# Patient Record
Sex: Female | Born: 1980
Health system: Southern US, Community
[De-identification: ages and names within clinical notes are randomized; demographics above are authoritative.]

## PROBLEM LIST (undated history)

## (undated) DIAGNOSIS — D649 Anemia, unspecified: Secondary | ICD-10-CM

## (undated) DIAGNOSIS — F32A Depression, unspecified: Secondary | ICD-10-CM

## (undated) DIAGNOSIS — I7774 Dissection of vertebral artery: Secondary | ICD-10-CM

## (undated) DIAGNOSIS — R51 Headache: Secondary | ICD-10-CM

## (undated) DIAGNOSIS — N979 Female infertility, unspecified: Secondary | ICD-10-CM

## (undated) DIAGNOSIS — R519 Headache, unspecified: Secondary | ICD-10-CM

## (undated) DIAGNOSIS — F319 Bipolar disorder, unspecified: Secondary | ICD-10-CM

## (undated) DIAGNOSIS — J45909 Unspecified asthma, uncomplicated: Secondary | ICD-10-CM

## (undated) DIAGNOSIS — J302 Other seasonal allergic rhinitis: Secondary | ICD-10-CM

## (undated) DIAGNOSIS — E039 Hypothyroidism, unspecified: Secondary | ICD-10-CM

## (undated) DIAGNOSIS — I639 Cerebral infarction, unspecified: Secondary | ICD-10-CM

## (undated) DIAGNOSIS — F329 Major depressive disorder, single episode, unspecified: Secondary | ICD-10-CM

## (undated) HISTORY — DX: Unspecified asthma, uncomplicated: J45.909

## (undated) HISTORY — DX: Dissection of vertebral artery: I77.74

## (undated) HISTORY — DX: Hypothyroidism, unspecified: E03.9

## (undated) HISTORY — PX: CYST REMOVAL HAND: SHX6279

## (undated) HISTORY — PX: WISDOM TOOTH EXTRACTION: SHX21

## (undated) HISTORY — DX: Cerebral infarction, unspecified: I63.9

---
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
## 2008-09-02 ENCOUNTER — Encounter (INDEPENDENT_AMBULATORY_CARE_PROVIDER_SITE_OTHER): Payer: Self-pay | Admitting: *Deleted

## 2008-09-16 ENCOUNTER — Telehealth (INDEPENDENT_AMBULATORY_CARE_PROVIDER_SITE_OTHER): Payer: Self-pay | Admitting: *Deleted

## 2008-09-22 ENCOUNTER — Telehealth (INDEPENDENT_AMBULATORY_CARE_PROVIDER_SITE_OTHER): Payer: Self-pay | Admitting: *Deleted

## 2008-10-02 ENCOUNTER — Ambulatory Visit: Payer: Self-pay | Admitting: Family Medicine

## 2008-10-02 DIAGNOSIS — E039 Hypothyroidism, unspecified: Secondary | ICD-10-CM | POA: Insufficient documentation

## 2008-10-02 DIAGNOSIS — Z8659 Personal history of other mental and behavioral disorders: Secondary | ICD-10-CM | POA: Insufficient documentation

## 2008-10-02 DIAGNOSIS — G43829 Menstrual migraine, not intractable, without status migrainosus: Secondary | ICD-10-CM | POA: Insufficient documentation

## 2008-10-02 DIAGNOSIS — D649 Anemia, unspecified: Secondary | ICD-10-CM | POA: Insufficient documentation

## 2008-10-02 DIAGNOSIS — R109 Unspecified abdominal pain: Secondary | ICD-10-CM | POA: Insufficient documentation

## 2008-10-02 DIAGNOSIS — J309 Allergic rhinitis, unspecified: Secondary | ICD-10-CM | POA: Insufficient documentation

## 2008-10-02 DIAGNOSIS — F988 Other specified behavioral and emotional disorders with onset usually occurring in childhood and adolescence: Secondary | ICD-10-CM | POA: Insufficient documentation

## 2008-10-08 LAB — CONVERTED CEMR LAB
ALT: 13 units/L (ref 0–35)
AST: 24 units/L (ref 0–37)
Albumin: 3.8 g/dL (ref 3.5–5.2)
Alkaline Phosphatase: 45 units/L (ref 39–117)
BUN: 12 mg/dL (ref 6–23)
Basophils Absolute: 0 10*3/uL (ref 0.0–0.1)
Basophils Relative: 0.2 % (ref 0.0–3.0)
Bilirubin, Direct: 0.1 mg/dL (ref 0.0–0.3)
CO2: 29 meq/L (ref 19–32)
Calcium: 9.5 mg/dL (ref 8.4–10.5)
Chloride: 105 meq/L (ref 96–112)
Cholesterol: 172 mg/dL (ref 0–200)
Creatinine, Ser: 0.7 mg/dL (ref 0.4–1.2)
Eosinophils Absolute: 0.1 10*3/uL (ref 0.0–0.7)
Eosinophils Relative: 1.9 % (ref 0.0–5.0)
GFR calc Af Amer: 127 mL/min
GFR calc non Af Amer: 105 mL/min
Glucose, Bld: 85 mg/dL (ref 70–99)
HCT: 37.6 % (ref 36.0–46.0)
HDL: 53.4 mg/dL (ref >39.0)
Hemoglobin: 12.6 g/dL (ref 12.0–15.0)
LDL Cholesterol: 104 mg/dL — ABNORMAL HIGH (ref 0–99)
Lymphocytes Relative: 33.7 % (ref 12.0–46.0)
MCHC: 33.5 g/dL (ref 30.0–36.0)
MCV: 93.4 fL (ref 78.0–100.0)
Monocytes Absolute: 0.5 10*3/uL (ref 0.1–1.0)
Monocytes Relative: 8.4 % (ref 3.0–12.0)
Neutro Abs: 3 10*3/uL (ref 1.4–7.7)
Neutrophils Relative %: 55.8 % (ref 43.0–77.0)
Platelets: 276 10*3/uL (ref 150–400)
Potassium: 4.7 meq/L (ref 3.5–5.1)
RBC: 4.03 M/uL (ref 3.87–5.11)
RDW: 12.4 % (ref 11.5–14.6)
Sodium: 141 meq/L (ref 135–145)
TSH: 3.83 microintl units/mL (ref 0.35–5.50)
Total Bilirubin: 0.9 mg/dL (ref 0.3–1.2)
Total CHOL/HDL Ratio: 3.2
Total Protein: 6.9 g/dL (ref 6.0–8.3)
Triglycerides: 71 mg/dL (ref 0–149)
VLDL: 14 mg/dL (ref 0–40)
WBC: 5.5 10*3/uL (ref 4.5–10.5)

## 2008-10-09 ENCOUNTER — Telehealth (INDEPENDENT_AMBULATORY_CARE_PROVIDER_SITE_OTHER): Payer: Self-pay | Admitting: *Deleted

## 2008-10-13 ENCOUNTER — Encounter: Payer: Self-pay | Admitting: Family Medicine

## 2008-10-14 ENCOUNTER — Telehealth (INDEPENDENT_AMBULATORY_CARE_PROVIDER_SITE_OTHER): Payer: Self-pay | Admitting: *Deleted

## 2008-10-16 ENCOUNTER — Telehealth (INDEPENDENT_AMBULATORY_CARE_PROVIDER_SITE_OTHER): Payer: Self-pay | Admitting: *Deleted

## 2008-10-29 ENCOUNTER — Ambulatory Visit: Payer: Self-pay | Admitting: Family Medicine

## 2008-10-29 ENCOUNTER — Encounter: Payer: Self-pay | Admitting: Family Medicine

## 2008-10-29 ENCOUNTER — Other Ambulatory Visit: Admission: RE | Admit: 2008-10-29 | Discharge: 2008-10-29 | Payer: Self-pay | Admitting: Family Medicine

## 2008-10-29 DIAGNOSIS — M25579 Pain in unspecified ankle and joints of unspecified foot: Secondary | ICD-10-CM | POA: Insufficient documentation

## 2008-11-04 ENCOUNTER — Encounter (INDEPENDENT_AMBULATORY_CARE_PROVIDER_SITE_OTHER): Payer: Self-pay | Admitting: *Deleted

## 2008-11-06 ENCOUNTER — Ambulatory Visit: Payer: Self-pay | Admitting: Sports Medicine

## 2008-11-06 DIAGNOSIS — S83419A Sprain of medial collateral ligament of unspecified knee, initial encounter: Secondary | ICD-10-CM | POA: Insufficient documentation

## 2008-12-07 ENCOUNTER — Telehealth (INDEPENDENT_AMBULATORY_CARE_PROVIDER_SITE_OTHER): Payer: Self-pay | Admitting: *Deleted

## 2008-12-09 ENCOUNTER — Telehealth (INDEPENDENT_AMBULATORY_CARE_PROVIDER_SITE_OTHER): Payer: Self-pay | Admitting: *Deleted

## 2009-03-19 ENCOUNTER — Ambulatory Visit: Payer: Self-pay | Admitting: Internal Medicine

## 2009-03-19 DIAGNOSIS — S0993XA Unspecified injury of face, initial encounter: Secondary | ICD-10-CM | POA: Insufficient documentation

## 2009-03-19 DIAGNOSIS — S199XXA Unspecified injury of neck, initial encounter: Secondary | ICD-10-CM

## 2009-03-24 ENCOUNTER — Ambulatory Visit: Payer: Self-pay | Admitting: Internal Medicine

## 2009-03-26 ENCOUNTER — Encounter (INDEPENDENT_AMBULATORY_CARE_PROVIDER_SITE_OTHER): Payer: Self-pay | Admitting: *Deleted

## 2009-05-13 ENCOUNTER — Ambulatory Visit: Payer: Self-pay | Admitting: Family Medicine

## 2009-05-13 DIAGNOSIS — R631 Polydipsia: Secondary | ICD-10-CM | POA: Insufficient documentation

## 2009-05-14 LAB — CONVERTED CEMR LAB
BUN: 15 mg/dL (ref 6–23)
CO2: 29 meq/L (ref 19–32)
Calcium: 10.1 mg/dL (ref 8.4–10.5)
Chloride: 105 meq/L (ref 96–112)
Creatinine, Ser: 1 mg/dL (ref 0.4–1.2)
GFR calc non Af Amer: 69.23 mL/min (ref >60)
Glucose, Bld: 81 mg/dL (ref 70–99)
Potassium: 5.2 meq/L — ABNORMAL HIGH (ref 3.5–5.1)
Sodium: 137 meq/L (ref 135–145)
TSH: 2.53 microintl units/mL (ref 0.35–5.50)

## 2009-06-18 ENCOUNTER — Encounter (INDEPENDENT_AMBULATORY_CARE_PROVIDER_SITE_OTHER): Payer: Self-pay | Admitting: *Deleted

## 2009-07-08 ENCOUNTER — Encounter (INDEPENDENT_AMBULATORY_CARE_PROVIDER_SITE_OTHER): Payer: Self-pay

## 2009-07-09 ENCOUNTER — Ambulatory Visit: Payer: Self-pay | Admitting: Gastroenterology

## 2009-07-19 ENCOUNTER — Ambulatory Visit: Payer: Self-pay | Admitting: Gastroenterology

## 2009-07-28 ENCOUNTER — Telehealth (INDEPENDENT_AMBULATORY_CARE_PROVIDER_SITE_OTHER): Payer: Self-pay | Admitting: *Deleted

## 2009-08-02 ENCOUNTER — Telehealth (INDEPENDENT_AMBULATORY_CARE_PROVIDER_SITE_OTHER): Payer: Self-pay | Admitting: *Deleted

## 2009-08-07 HISTORY — PX: OVARIAN CYST REMOVAL: SHX89

## 2009-08-13 ENCOUNTER — Ambulatory Visit: Payer: Self-pay | Admitting: Family

## 2009-08-13 DIAGNOSIS — R110 Nausea: Secondary | ICD-10-CM | POA: Insufficient documentation

## 2009-08-13 DIAGNOSIS — J209 Acute bronchitis, unspecified: Secondary | ICD-10-CM | POA: Insufficient documentation

## 2009-08-13 LAB — CONVERTED CEMR LAB
Rapid Strep: NEGATIVE
TSH: 1.486 microintl units/mL (ref 0.350–4.500)

## 2009-08-16 ENCOUNTER — Encounter: Payer: Self-pay | Admitting: Family

## 2009-11-17 ENCOUNTER — Ambulatory Visit: Payer: Self-pay | Admitting: Family Medicine

## 2009-11-17 DIAGNOSIS — N390 Urinary tract infection, site not specified: Secondary | ICD-10-CM | POA: Insufficient documentation

## 2009-11-17 LAB — CONVERTED CEMR LAB
Bilirubin Urine: NEGATIVE
Glucose, Urine, Semiquant: NEGATIVE
Ketones, urine, test strip: NEGATIVE
Nitrite: NEGATIVE
Protein, U semiquant: NEGATIVE
Specific Gravity, Urine: 1.015
Urobilinogen, UA: 0.2
WBC Urine, dipstick: NEGATIVE
pH: 6

## 2009-11-18 ENCOUNTER — Encounter: Payer: Self-pay | Admitting: Family Medicine

## 2009-11-22 ENCOUNTER — Telehealth (INDEPENDENT_AMBULATORY_CARE_PROVIDER_SITE_OTHER): Payer: Self-pay | Admitting: *Deleted

## 2009-12-10 ENCOUNTER — Other Ambulatory Visit: Admission: RE | Admit: 2009-12-10 | Discharge: 2009-12-10 | Payer: Self-pay | Admitting: Family Medicine

## 2009-12-10 ENCOUNTER — Ambulatory Visit: Payer: Self-pay | Admitting: Family Medicine

## 2009-12-10 LAB — CONVERTED CEMR LAB
ALT: 8 units/L (ref 0–35)
AST: 22 units/L (ref 0–37)
Albumin: 3.9 g/dL (ref 3.5–5.2)
Alkaline Phosphatase: 49 units/L (ref 39–117)
BUN: 14 mg/dL (ref 6–23)
Basophils Absolute: 0 10*3/uL (ref 0.0–0.1)
Basophils Relative: 0.7 % (ref 0.0–3.0)
Bilirubin, Direct: 0 mg/dL (ref 0.0–0.3)
CO2: 28 meq/L (ref 19–32)
Calcium: 9 mg/dL (ref 8.4–10.5)
Chloride: 104 meq/L (ref 96–112)
Cholesterol: 148 mg/dL (ref 0–200)
Creatinine, Ser: 0.7 mg/dL (ref 0.4–1.2)
Eosinophils Absolute: 0.1 10*3/uL (ref 0.0–0.7)
Eosinophils Relative: 1.1 % (ref 0.0–5.0)
GFR calc non Af Amer: 111.39 mL/min (ref >60)
Glucose, Bld: 84 mg/dL (ref 70–99)
HCT: 33.5 % — ABNORMAL LOW (ref 36.0–46.0)
HDL: 55.8 mg/dL (ref >39.00)
Hemoglobin: 11.1 g/dL — ABNORMAL LOW (ref 12.0–15.0)
LDL Cholesterol: 85 mg/dL (ref 0–99)
Lymphocytes Relative: 34.8 % (ref 12.0–46.0)
Lymphs Abs: 1.7 10*3/uL (ref 0.7–4.0)
MCHC: 33.1 g/dL (ref 30.0–36.0)
MCV: 88.3 fL (ref 78.0–100.0)
Monocytes Absolute: 0.5 10*3/uL (ref 0.1–1.0)
Monocytes Relative: 9.4 % (ref 3.0–12.0)
Neutro Abs: 2.7 10*3/uL (ref 1.4–7.7)
Neutrophils Relative %: 54 % (ref 43.0–77.0)
Platelets: 278 10*3/uL (ref 150.0–400.0)
Potassium: 4.6 meq/L (ref 3.5–5.1)
RBC: 3.79 M/uL — ABNORMAL LOW (ref 3.87–5.11)
RDW: 15.2 % — ABNORMAL HIGH (ref 11.5–14.6)
Sodium: 139 meq/L (ref 135–145)
TSH: 1.19 microintl units/mL (ref 0.35–5.50)
Total Bilirubin: 0.2 mg/dL — ABNORMAL LOW (ref 0.3–1.2)
Total CHOL/HDL Ratio: 3
Total Protein: 7.1 g/dL (ref 6.0–8.3)
Triglycerides: 36 mg/dL (ref 0.0–149.0)
VLDL: 7.2 mg/dL (ref 0.0–40.0)
WBC: 4.9 10*3/uL (ref 4.5–10.5)

## 2009-12-13 LAB — CONVERTED CEMR LAB: Vit D, 25-Hydroxy: 21 ng/mL — ABNORMAL LOW (ref 30–89)

## 2009-12-15 ENCOUNTER — Encounter (INDEPENDENT_AMBULATORY_CARE_PROVIDER_SITE_OTHER): Payer: Self-pay | Admitting: *Deleted

## 2009-12-15 LAB — CONVERTED CEMR LAB: Pap Smear: NEGATIVE

## 2009-12-16 ENCOUNTER — Telehealth (INDEPENDENT_AMBULATORY_CARE_PROVIDER_SITE_OTHER): Payer: Self-pay | Admitting: *Deleted

## 2009-12-27 ENCOUNTER — Telehealth (INDEPENDENT_AMBULATORY_CARE_PROVIDER_SITE_OTHER): Payer: Self-pay | Admitting: *Deleted

## 2010-05-06 ENCOUNTER — Ambulatory Visit: Payer: Self-pay | Admitting: Family Medicine

## 2010-05-06 DIAGNOSIS — R143 Flatulence: Secondary | ICD-10-CM

## 2010-05-06 DIAGNOSIS — R142 Eructation: Secondary | ICD-10-CM

## 2010-05-06 DIAGNOSIS — R141 Gas pain: Secondary | ICD-10-CM | POA: Insufficient documentation

## 2010-05-06 DIAGNOSIS — E559 Vitamin D deficiency, unspecified: Secondary | ICD-10-CM | POA: Insufficient documentation

## 2010-05-06 LAB — CONVERTED CEMR LAB
Bilirubin Urine: NEGATIVE
Blood in Urine, dipstick: NEGATIVE
Glucose, Urine, Semiquant: NEGATIVE
Ketones, urine, test strip: NEGATIVE
Nitrite: NEGATIVE
Protein, U semiquant: NEGATIVE
Specific Gravity, Urine: 1.01
Urobilinogen, UA: 0.2
WBC Urine, dipstick: NEGATIVE
pH: 6.5

## 2010-05-09 ENCOUNTER — Telehealth (INDEPENDENT_AMBULATORY_CARE_PROVIDER_SITE_OTHER): Payer: Self-pay | Admitting: *Deleted

## 2010-05-09 ENCOUNTER — Telehealth: Payer: Self-pay | Admitting: Family Medicine

## 2010-05-09 LAB — CONVERTED CEMR LAB
Basophils Absolute: 0.1 10*3/uL (ref 0.0–0.1)
Basophils Relative: 0 % (ref 0–1)
CA 125: 144.8 units/mL — ABNORMAL HIGH (ref 0.0–30.2)
Eosinophils Absolute: 0.1 10*3/uL (ref 0.0–0.7)
Eosinophils Relative: 1 % (ref 0–5)
HCT: 37.1 % (ref 36.0–46.0)
Helicobacter Pylori Antibody-IgG: 0.4
Hemoglobin: 12.5 g/dL (ref 12.0–15.0)
Lymphocytes Relative: 12 % (ref 12–46)
Lymphs Abs: 1.6 10*3/uL (ref 0.7–4.0)
MCHC: 33.7 g/dL (ref 30.0–36.0)
MCV: 88.3 fL (ref 78.0–100.0)
Monocytes Absolute: 0.7 10*3/uL (ref 0.1–1.0)
Monocytes Relative: 6 % (ref 3–12)
Neutro Abs: 10.5 10*3/uL — ABNORMAL HIGH (ref 1.7–7.7)
Neutrophils Relative %: 81 % — ABNORMAL HIGH (ref 43–77)
Platelets: 247 10*3/uL (ref 150–400)
RBC: 4.2 M/uL (ref 3.87–5.11)
RDW: 15.3 % (ref 11.5–15.5)
Vit D, 25-Hydroxy: 26 ng/mL — ABNORMAL LOW (ref 30–89)
WBC: 13 10*3/uL — ABNORMAL HIGH (ref 4.0–10.5)

## 2010-05-10 ENCOUNTER — Ambulatory Visit: Payer: Self-pay | Admitting: Cardiology

## 2010-05-10 ENCOUNTER — Encounter: Payer: Self-pay | Admitting: Family Medicine

## 2010-05-10 ENCOUNTER — Telehealth: Payer: Self-pay | Admitting: Family Medicine

## 2010-05-10 DIAGNOSIS — N83209 Unspecified ovarian cyst, unspecified side: Secondary | ICD-10-CM | POA: Insufficient documentation

## 2010-05-12 ENCOUNTER — Telehealth: Payer: Self-pay | Admitting: Family Medicine

## 2010-05-12 ENCOUNTER — Encounter: Admission: RE | Admit: 2010-05-12 | Discharge: 2010-05-12 | Payer: Self-pay | Admitting: Family Medicine

## 2010-09-06 NOTE — Assessment & Plan Note (Signed)
Summary: cold/kdc   Vital Signs:  Patient profile:   30 year old female Weight:      121 pounds Temp:     97.5 degrees F oral Pulse rate:   94 / minute BP sitting:   112 / 80  (left arm)  Vitals Entered By: Doristine Devoid (August 13, 2009 3:26 PM) CC: some sore throat and sinus drainage also some nausea and fatigue last week    CC:  some sore throat and sinus drainage also some nausea and fatigue last week .  History of Present Illness: Teresa Guzman is a 30 year old female who presents with c/o nausea since christmas, this has been accompanied by dizziness and nausea.  Had nasal congestion start about 1 week ago.  Nasal congestion has resolved- pt has been using Neti pot, sinus wash.  Now patient presents with + sore throat x 5 days which is getting worse.  Now loosing voice and + dry hacking cough x 5 days.  Took pregnancy test on wednesday and it was negative   Allergies: No Known Drug Allergies  Review of Systems       Denies fever, no nausea at present- worse in AM and after eating.  LMP 12/15  Physical Exam  General:  Well-developed,well-nourished,in no acute distress; alert,appropriate and cooperative throughout examination Eyes:  PERRLA Ears:  External ear exam shows no significant lesions or deformities.  Otoscopic examination reveals clear canals, tympanic membranes are intact bilaterally without bulging, retraction, inflammation or discharge. Hearing is grossly normal bilaterally. Mouth:  Oral mucosa and oropharynx without lesions or exudates.  Teeth in good repair. Neck:  No deformities, masses, or tenderness noted. Lungs:  Normal respiratory effort, chest expands symmetrically. Lungs are clear to auscultation, no crackles or wheezes. Heart:  Normal rate and regular rhythm. S1 and S2 normal without gallop, murmur, click, rub or other extra sounds.   Impression & Recommendations:  Problem # 1:  ACUTE BRONCHITIS (ICD-466.0) Assessment New Plan to treat with z-pak, as  well as tessalon for cough Her updated medication list for this problem includes:    Zithromax Z-pak 250 Mg Tabs (Azithromycin) .Marland Kitchen... 2 tabs by mouth x 1 today, then one tab by mouth daily x 4 more days    Tessalon Perles 100 Mg Caps (Benzonatate) ..... One cap by mouth three times a day as needed cough  Orders: Rapid Strep (04540)  Problem # 2:  HYPOTHYROIDISM (ICD-244.9) Assessment: Comment Only Will check TSH given c/o of nausea- this may be thyroid related. Her updated medication list for this problem includes:    Levothyroxine Sodium 100 Mcg Tabs (Levothyroxine sodium) .Marland Kitchen... 1 by mouth once daily  Orders: T-TSH (98119-14782)  Problem # 3:  NAUSEA (ICD-787.02) Assessment: New ? related to acute viral illness, ?thyroid, check TSH, reports neg preg test 2 days ago - period due next week, monitor.  Complete Medication List: 1)  Levothyroxine Sodium 100 Mcg Tabs (Levothyroxine sodium) .Marland Kitchen.. 1 by mouth once daily 2)  Imitrex 50 Mg Tabs (Sumatriptan succinate) .Marland Kitchen.. 1 tab by mouth as needed.  may repeat in 2 hours if sxs persist.  disp 1 month supply 3)  Voltaren 1 % Gel (Diclofenac sodium) .... Two times a day  to qid as needed 4)  Fluticasone Propionate 50 Mcg/act Susp (Fluticasone propionate) .Marland Kitchen.. 1-2 sprays each nostril daily 5)  Zithromax Z-pak 250 Mg Tabs (Azithromycin) .... 2 tabs by mouth x 1 today, then one tab by mouth daily x 4 more days  6)  Tessalon Perles 100 Mg Caps (Benzonatate) .... One cap by mouth three times a day as needed cough  Patient Instructions: 1)  Please call if fever over 101, worsening cough, if symptoms worsen or if they do not improve. Prescriptions: TESSALON PERLES 100 MG CAPS (BENZONATATE) one cap by mouth three times a day as needed cough  #30 x 0   Entered and Authorized by:   Lemont Fillers FNP   Signed by:   Lemont Fillers FNP on 08/13/2009   Method used:   Electronically to        [facility] [phone]* (retail)       [address]       [place], [place]  [address]       Ph: [phone]       Fax: [phone]   RxID:   [id number] ZITHROMAX Z-PAK 250 MG TABS (AZITHROMYCIN) 2 tabs by mouth x 1 today, then one tab by mouth daily x 4 more days  #1 pack x 0   Entered and Authorized by:   Lemont Fillers FNP   Signed by:   Lemont Fillers FNP on 08/13/2009   Method used:   Electronically to        [facility] #[id number]* (retail)       [address]       [place], [place]  [address]       Ph: [phone]       Fax: [phone]   RxID:   [id number]   Laboratory Results    Other Tests  Rapid Strep: negative  Kit Test Internal QC: Positive   (Normal Range: Negative)

## 2010-09-06 NOTE — Progress Notes (Signed)
Summary: labs  Phone Note Outgoing Call   Call placed by: Doristine Devoid,  Dec 16, 2009 10:21 AM Call placed to: Patient Summary of Call: level mildly low.  needs to start 50,000 units weekly x8 weeks and then recheck level   Follow-up for Phone Call        spoke w/ patient aware of labs and start of medication ...........Marland KitchenDoristine Devoid  Dec 16, 2009 4:11 PM     New/Updated Medications: VITAMIN D (ERGOCALCIFEROL) 50000 UNIT CAPS (ERGOCALCIFEROL) take one tablet weekly Prescriptions: VITAMIN D (ERGOCALCIFEROL) 50000 UNIT CAPS (ERGOCALCIFEROL) take one tablet weekly  #8 x 0   Entered by:   Doristine Devoid   Authorized by:   [name] MD   Signed by:   Doristine Devoid on 12/16/2009   Method used:   Electronically to        [facility] [address] #[id number]* (retail)       [address]       [place], [place]  [address]       Ph: [phone]       Fax: [phone]   RxID:   [id number]

## 2010-09-06 NOTE — Medication Information (Signed)
Summary: Prior Authorization for Ondansetron/[facility]  Prior Authorization for Ondansetron/[facility]   Imported By: Lanelle Bal 05/19/2010 08:42:41  _____________________________________________________________________  External Attachment:    Type:   Image     Comment:   External Document

## 2010-09-06 NOTE — Progress Notes (Signed)
Summary: [facility]---RX voltaren  Phone Note Refill Request   Refills Requested: Medication #1:  VOLTAREN 1 %  GEL two times a day  to qid as needed. [facility] [address]--[phone]  Initial call taken by: Freddy Jaksch,  Dec 07, 2008 10:26 AM  Follow-up for Phone Call        PER PHARMACIST DISREGARD THIS REQUEST Follow-up by: Kandice Hams,  Dec 07, 2008 4:27 PM

## 2010-09-06 NOTE — Letter (Signed)
   [place] [facility] [address] [place], [place]  [address] Phone: [phone]      August 16, 2009   Teresa Guzman [address] [place], [place] [address]  RE:  LAB RESULTS  Dear  [name],  The following is an interpretation of your most recent lab tests.  Please take note of any instructions provided or changes to medications that have resulted from your lab work.   THYROID STUDIES:  Thyroid studies normal TSH: 1.486      Sincerely Yours,    Lemont Fillers FNP

## 2010-09-06 NOTE — Progress Notes (Signed)
Summary: nasonex rx   Phone Note Refill Request Message from:  Fax from Pharmacy on [facility] [address] fax [phone]  fluticasone nasal sp 120in,  pt would rather have the nasonex if you can send over an rx for that thanks.  Initial call taken by: Barb Merino,  July 28, 2009 9:01 AM    New/Updated Medications: NASONEX 50 MCG/ACT SUSP (MOMETASONE FUROATE) 1-2 sprays each nostril daily Prescriptions: NASONEX 50 MCG/ACT SUSP (MOMETASONE FUROATE) 1-2 sprays each nostril daily  #1 x 3   Entered by:   Doristine Devoid   Authorized by:   [name] MD   Signed by:   Doristine Devoid on 07/28/2009   Method used:   Electronically to        [facility] [address] #[id number]* (retail)       [address]       [place], [place]  [address]       Ph: [phone]       Fax: [phone]   RxID:   [id number]
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
## 2010-09-06 NOTE — Progress Notes (Signed)
Summary: urine cx  Phone Note Outgoing Call   Call placed by: Doristine Devoid,  November 22, 2009 3:30 PM Call placed to: Patient Summary of Call: pt can finish abx but very few bacteria grew in urine.  she should let us know if sxs have not improved.  Follow-up for Phone Call        left message on machine ........Marland KitchenDoristine Devoid  November 22, 2009 3:30 PM   left detailed msg on voicemail about urine cx .Marland KitchenMarland KitchenMarland KitchenDoristine Devoid  November 22, 2009 4:44 PM
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
## 2010-09-06 NOTE — Letter (Signed)
Summary: [facility] Instructions  [facility]  [address] [place], [place] [address]   Phone: [phone]  Fax: [phone]       Teresa Guzman    01/14/1981    MRN: [id number]        Procedure Day /Date:Monday 07/19/09     Arrival Time: 1:30 p.m.     Procedure Time: 2:30 p.m.     Location of Procedure:                    [facility] (4th Floor)                         PREPARATION FOR COLONOSCOPY WITH MOVIPREP   Starting 5 days prior to your procedure 07/14/09  do not eat nuts, seeds, popcorn, corn, beans, peas,  salads, or any raw vegetables.  Do not take any fiber supplements (e.g. Metamucil, Citrucel, and Benefiber).  THE DAY BEFORE YOUR PROCEDURE         DATE: 07/18/09 DAY: Sunday  1.  Drink clear liquids the entire day-NO SOLID FOOD  2.  Do not drink anything colored red or purple.  Avoid juices with pulp.  No orange juice.  3.  Drink at least 64 oz. (8 glasses) of fluid/clear liquids during the day to prevent dehydration and help the prep work efficiently.  CLEAR LIQUIDS INCLUDE: Water Jello Ice Popsicles Tea (sugar ok, no milk/cream) Powdered fruit flavored drinks Coffee (sugar ok, no milk/cream) Gatorade Juice: apple, white grape, white cranberry  Lemonade Clear bullion, consomm, broth Carbonated beverages (any kind) Strained chicken noodle soup Hard Candy                             4.  In the morning, mix first dose of MoviPrep solution:    Empty 1 Pouch A and 1 Pouch B into the disposable container    Add lukewarm drinking water to the top line of the container. Mix to dissolve    Refrigerate (mixed solution should be used within 24 hrs)  5.  Begin drinking the prep at 5:00 p.m. The MoviPrep container is divided by 4 marks.   Every 15 minutes drink the solution down to the next mark (approximately 8 oz) until the full liter is complete.   6.  Follow completed prep with 16 oz of clear liquid of your choice (Nothing  red or purple).  Continue to drink clear liquids until bedtime.  7.  Before going to bed, mix second dose of MoviPrep solution:    Empty 1 Pouch A and 1 Pouch B into the disposable container    Add lukewarm drinking water to the top line of the container. Mix to dissolve    Refrigerate  THE DAY OF YOUR PROCEDURE      DATE: 07/19/18  DAY: Monday  Beginning at 9:30 a.m. (5 hours before procedure):         1. Every 15 minutes, drink the solution down to the next mark (approx 8 oz) until the full liter is complete.  2. Follow completed prep with 16 oz. of clear liquid of your choice.    3. You may drink clear liquids until 12:30 p.m. (2 HOURS BEFORE PROCEDURE).   MEDICATION INSTRUCTIONS  Unless otherwise instructed, you should take regular prescription medications with a small sip of water   as early as possible the morning of your  procedure.           OTHER INSTRUCTIONS  You will need a responsible adult at least 32 years of age to accompany you and drive you home.   This person must remain in the waiting room during your procedure.  Wear loose fitting clothing that is easily removed.  Leave jewelry and other valuables at home.  However, you may wish to bring a book to read or  an iPod/MP3 player to listen to music as you wait for your procedure to start.  Remove all body piercing jewelry and leave at home.  Total time from sign-in until discharge is approximately 2-3 hours.  You should go home directly after your procedure and rest.  You can resume normal activities the  day after your procedure.  The day of your procedure you should not:   Drive   Make legal decisions   Operate machinery   Drink alcohol   Return to work  You will receive specific instructions about eating, activities and medications before you leave.    The above instructions have been reviewed and explained to me by   Ulis Rias RN  July 09, 2009 10:50 AM    I fully  understand and can verbalize these instructions _____________________________ Date _________

## 2010-09-06 NOTE — Progress Notes (Signed)
Summary: [facility]---rx  Phone Note Refill Request   Refills Requested: Medication #1:  LEVOTHYROXINE SODIUM 100 MCG TABS 1 by mouth once daily. Sandi Mealy on [facility]--[phone]--IONG filled--8.31.09  Initial call taken by: Freddy Jaksch,  September 22, 2008 3:21 PM  Follow-up for Phone Call        faxed 09/16/08 pharmacy has rx Follow-up by: Kandice Hams,  September 22, 2008 5:01 PM

## 2010-09-06 NOTE — Letter (Signed)
Summary: New Patient Letter  [place] [facility] [place]  [address] [place], [place] [address]   Phone: [phone]  Fax: [phone]       09/02/2008 MRN: [id number]  Jaydah TROESCH [address] [place], [place]  [address]  Dear [name],   Welcome to [facility] and thank you for choosing [place] as your Primary Care Providers. Enclosed you will find information about our practice that we hope you find helpful. We have also enclosed forms to be filled out prior to your visit. This will provide [place] with the necessary information and facilitate your being seen in a timely manner. If you have any questions, please call us at:  [phone]    and we will be happy to assist you. We look forward to seeing you at your scheduled appointment time.  Appointment   09-11-08 @ 10:30 AM      with Dr.    [name]       Sincerely,  Primary Health Care Team  Please arrive 15 minutes early for your first appointment and bring your insurance card. Co-pay is required at the time of your visit.  *****Please call the office if you are not able to keep this appointment. There is a charge of $50.00 if any appointment is not cancelled or rescheduled within 24 hours.

## 2010-09-06 NOTE — Assessment & Plan Note (Signed)
Summary: new to establish//ph   Vital Signs:  Patient Profile:   30 Years Old Female Height:     61.25 inches Weight:      117.8 pounds Pulse rate:   70 / minute Resp:     14 per minute BP sitting:   100 / 68  (left arm)  Vitals Entered By: Doristine Devoid (October 02, 2008 8:26 AM)                 Chief Complaint:  new est- refil on meds pain on right side hx of ovarian cyst.  History of Present Illness: 30 yo woman here today to establish care.  moved from [facility] 6 months ago.  1) Bipolar- previously on Lexapro, was working well, dose unknown.  hx of Lithium tx and 'a bunch of other stuff'.  has not been seeing psych since moving here  2) ADD- previously on Strattera, dx'd as ADD in 2000.  was seeing psych in [place]  3) Hypothyroid- levothyroxine .  taking meds since age 59.  missed 1 week of meds but now resumed meds.  4) Anemia- hx of iron supplementation in past.  was having CBCs Q6 months.  taking multivitamin daily  5) Migraines- was on Imitrex.  OTC migraine meds not helping.  notes they have been worsening.  1-2 HAs/week, lasting 1-2 days.  never on migraine prophylaxis.  family hx of migraines.  6) Allergies- using Claritin and OTC decongestants.  previously on nasal steroid spray but not since moving to [place]  7) RLQ abd pain- shooting pain, intermittant, 1-2 months of pain.  pain becoming more frequent.  hx of ovarian cyst.  hasn't noticed relationship to menstrual cycle.  no spotting or increased vaginal bleeding.  no N/V.  pain corresponds w/ stopping OCP.  LMP 09/13/08.    Current Allergies: No known allergies   Past Medical History:    Anemia-NOS    Depression/Bipolar    ADD    Hypothyroidism    Asthma  Past Surgical History:    L arm ORIF age 63   Family History:    CAD-father    HTN-father    DM-no    STROKE-no    COLON CA-no    BREAST CA-no  Social History:    teaches [employer]    originally from [facility]    no tobacco    lives w/ boyfriend,  mom, sister and husband, niece/nephew   Risk Factors:  Tobacco use:  never   Review of Systems  General      Denies chills, fatigue, fever, and loss of appetite.  Eyes      Denies blurring, double vision, and eye pain.  ENT      Complains of nasal congestion and postnasal drainage.      Denies earache.  CV      Denies chest pain or discomfort, shortness of breath with exertion, swelling of feet, and swelling of hands.  Resp      Denies cough.  GI      Complains of abdominal pain.      Denies bloody stools, change in bowel habits, gas, loss of appetite, nausea, and vomiting.  GU      Denies abnormal vaginal bleeding.  Neuro      Complains of headaches.      Denies poor balance, tremors, and visual disturbances.  Psych      Denies anxiety and depression.  Endo      Denies cold intolerance and heat intolerance.  Physical Exam  General:     Well-developed,well-nourished,in no acute distress; alert,appropriate and cooperative throughout examination Head:     Normocephalic and atraumatic without obvious abnormalities. No apparent alopecia or balding. Eyes:     No corneal or conjunctival inflammation noted. EOMI. Perrla. Funduscopic exam benign, without hemorrhages, exudates or papilledema. Vision grossly normal. Ears:     External ear exam shows no significant lesions or deformities.  Otoscopic examination reveals clear canals, tympanic membranes are intact bilaterally without bulging, retraction, inflammation or discharge. Hearing is grossly normal bilaterally. Nose:     edematous turbinates bilaterally Mouth:     + post nasal drip Neck:     No deformities, masses, or tenderness noted. Lungs:     Normal respiratory effort, chest expands symmetrically. Lungs are clear to auscultation, no crackles or wheezes. Heart:     Normal rate and regular rhythm. S1 and S2 normal without gallop, murmur, click, rub or other extra sounds. Abdomen:     soft, NT/ND, +BS.   no rebound, guarding. Pulses:     +2 carotid, radial, DP Extremities:     no C/C/E Neurologic:     alert & oriented X3, cranial nerves II-XII intact, strength normal in all extremities, gait normal, and DTRs symmetrical and normal.   Cervical Nodes:     No lymphadenopathy noted Psych:     Cognition and judgment appear intact. Alert and cooperative with normal attention span and concentration. No apparent delusions, illusions, hallucinations    Impression & Recommendations:  Problem # 1:  HYPOTHYROIDISM (ICD-244.9) Assessment: New pt reports levels have been stable on current dose of meds.  will check today and change meds as needed. Her updated medication list for this problem includes:    Levothyroxine Sodium 100 Mcg Tabs (Levothyroxine sodium) .Marland Kitchen... 1 by mouth once daily  Orders: Venipuncture ([address])   Problem # 2:  MIGRAINE (ICD-346.90) Assessment: New pt reports increased HAs since moving.  may be allergic trigger, may be b/c she stopped OCPs which were controlling sxs.  imitrex given and red flags that should prompt return.  if controlling rhinitis sxs and restarting OCPs have no impact on HA would be appropriate to start controller med or send to neuro for eval.  Pt expresses understanding and is in agreement w/ this plan. Her updated medication list for this problem includes:    Imitrex 50 Mg Tabs (Sumatriptan succinate) .Marland Kitchen... 1 tab by mouth as needed.  may repeat in 2 hours if sxs persist.  disp 1 month supply   Problem # 3:  BIPOLAR AFFECTIVE DISORDER, HX OF (ICD-V11.8) Assessment: New pt reports Lexapro is her current tx.  out of meds.  will refill but pt needs to establish w/ psych to continue treatment. Orders: Psychiatric Referral (Psych)   Problem # 4:  ANEMIA-NOS (ICD-285.9) Assessment: New pt w/ hx of anemia.  not currently on iron.  will check labs and determine whether pt is in need of supplementation.  Problem # 5:  ABDOMINAL PAIN  (ICD-789.00) Assessment: New pt's pain is not reproducible on exam.  pt reports correlation between stopping OCPs and pain starting.  offered [place] but pt would rather re-start meds and then assess pain.  will follow closely.  Problem # 6:  RHINITIS (ICD-477.9) Assessment: New edematous turbinates on exam.  pt admits to increased allergy sxs since moving to [place].  start nasal steroid.  may be cause of increased HAs. Her updated medication list for this problem includes:    Fluticasone Propionate  50 Mcg/act Susp (Fluticasone propionate) .Marland Kitchen... 2 sprays each nostril once daily   Problem # 7:  ADD (ICD-314.00) Assessment: New pt out of meds.  will prescribe but pt needs to re-establish w/ psych.  Pt expresses understanding and is in agreement w/ this plan.  Problem # 8:  HEALTHY ADULT FEMALE (ICD-V70.0) Assessment: New labs collected for upcoming CPE. Orders: TLB-Lipid Panel (80061-LIPID) TLB-BMP (Basic Metabolic Panel-BMET) (80048-METABOL) TLB-CBC Platelet - w/Differential (85025-CBCD) TLB-Hepatic/Liver Function Pnl (80076-HEPATIC) TLB-TSH (Thyroid Stimulating Hormone) (84443-TSH)   Complete Medication List: 1)  Levothyroxine Sodium 100 Mcg Tabs (Levothyroxine sodium) .Marland Kitchen.. 1 by mouth once daily 2)  Tri-sprintec 0.035 Mg Tabs (Norgestimate-ethinyl estradiol) .... Take one tablet daily 3)  Strattera 60 Mg Caps (Atomoxetine hcl) .... Take one tablet daily 4)  Lexapro 10 Mg Tabs (Escitalopram oxalate) .... Take one tablet daily 5)  Fluticasone Propionate 50 Mcg/act Susp (Fluticasone propionate) .... 2 sprays each nostril once daily 6)  Imitrex 50 Mg Tabs (Sumatriptan succinate) .Marland Kitchen.. 1 tab by mouth as needed.  may repeat in 2 hours if sxs persist.  disp 1 month supply   Patient Instructions: 1)  follow up in 3-4 weeks for complete physical 2)  someone will call you with the #s to get in touch w/ a psychiatrist 3)  we will notify you of your labs 4)  start the birth control the sunday after  your next period 5)  Use the nasal spray daily- this will likely improve your headaches 6)  Continue Claritin or Zyrtec OTC 7)  IF your abdominal pain worsens- please call the office or go to the ER 8)  Call with any questions or concerns 9)  Welcome!   Prescriptions: LEXAPRO 10 MG TABS (ESCITALOPRAM OXALATE) take one tablet daily  #30 x 3   Entered and Authorized by:   [name] MD   Signed by:   [name] MD on 10/02/2008   Method used:   Print then Give to Patient   RxID:   [id number] STRATTERA 60 MG CAPS (ATOMOXETINE HCL) take one tablet daily  #30 x 3   Entered and Authorized by:   [name] MD   Signed by:   [name] MD on 10/02/2008   Method used:   Print then Give to Patient   RxID:   [id number] TRI-SPRINTEC 0.035 MG TABS (NORGESTIMATE-ETHINYL ESTRADIOL) take one tablet daily  #1 x 6   Entered and Authorized by:   [name] MD   Signed by:   [name] MD on 10/02/2008   Method used:   Print then Give to Patient   RxID:   [id number] LEVOTHYROXINE SODIUM 100 MCG TABS (LEVOTHYROXINE SODIUM) 1 by mouth once daily  #30 x 6   Entered and Authorized by:   [name] MD   Signed by:   [name] MD on 10/02/2008   Method used:   Print then Give to Patient   RxID:   [id number] IMITREX 50 MG TABS (SUMATRIPTAN SUCCINATE) 1 tab by mouth as needed.  may repeat in 2 hours if sxs persist.  disp 1 month supply  #1 x 3   Entered and Authorized by:   [name] MD   Signed by:   [name] MD on 10/02/2008   Method used:   Print then Give to Patient   RxID:   [id number] FLUTICASONE PROPIONATE 50 MCG/ACT  SUSP (FLUTICASONE PROPIONATE) 2 sprays each nostril once daily  #1 x 3   Entered and Authorized by:  [name] MD   Signed by:   [name] MD on 10/02/2008   Method used:   Print then Give to Patient   RxID:   [id number]

## 2010-09-06 NOTE — Miscellaneous (Signed)
  Clinical Lists Changes  Orders: Added new Referral order of Gastroenterology Referral (GI) - Signed  

## 2010-09-06 NOTE — Assessment & Plan Note (Signed)
Summary: stomache issue/cbs   Vital Signs:  Patient profile:   30 year old female Height:      60 inches (152.40 cm) Weight:      120.38 pounds (54.72 kg) BMI:     23.60 Temp:     98.5 degrees F (36.94 degrees C) oral BP sitting:   110 / 76  (left arm) Cuff size:   regular  Vitals Entered By: Lucious Groves CMA (May 06, 2010 3:11 PM) CC: C/O stomach issues./kb Is Patient Diabetic? No Pain Assessment Patient in pain? no      Comments Patient notes that she has been having nausea, bloating, and cramps. Per patient she is trying to get pregnant. She denies pain at this time (it does come and go), fever, and diarrhea. Patient does not think that she has rectal bleeding or blood in the stool but notes that she is on her cycle now./kb   History of Present Illness: 30 yo woman here today for 'stomach issues'.  bloating- responds to phazyme but then returns.  has been present for 3 months.  + nausea.  not related to food.  + abd cramping.  no diarrhea.  currently on menses.  last 2 cycles have been 'really really heavy'.  follows healthy diet, drinks plenty of water.  no constipation.  pt had nausea for the last month.  recent pap and colonoscopy normal.  no fevers, chills.  pt very anxious.  has been trying to get pregnant since wedding.  very stressed about this.  thought she was pregnant this month due to nausea.  upset she got her period.  has been counting her cycle starting w/ the last day of her period.  Current Medications (verified): 1)  Levothyroxine Sodium 100 Mcg Tabs (Levothyroxine Sodium) .Marland Kitchen.. 1 By Mouth Once Daily 2)  Imitrex 50 Mg Tabs (Sumatriptan Succinate) .Marland Kitchen.. 1 Tab By Mouth As Needed.  May Repeat in 2 Hours If Sxs Persist.  Disp 1 Month Supply 3)  Voltaren 1 %  Gel (Diclofenac Sodium) .... Two Times A Day  To Qid As Needed 4)  Fluticasone Propionate 50 Mcg/act Susp (Fluticasone Propionate) .Marland Kitchen.. 1-2 Sprays Each Nostril Daily 5)  Clarinex-D 24 Hour 5-240 Mg Xr24h-Tab  (Desloratadine-Pseudoephedrine) .Marland Kitchen.. 1 Tab By Mouth Daily 6)  Vitamin D (Ergocalciferol) 50000 Unit Caps (Ergocalciferol) .... Take One Tablet Weekly 7)  Bentyl 20 Mg Tabs (Dicyclomine Hcl) .Marland Kitchen.. 1 Tab By Mouth Three Times A Day As Needed For Abdominal Spasm 8)  Zofran 8 Mg  Tabs (Ondansetron Hcl) .Marland Kitchen.. 1 By Mouth Q 8h Prn  Allergies (verified): No Known Drug Allergies  Past History:  Past Medical History: Last updated: 10/02/2008 Anemia-NOS Depression/Bipolar ADD Hypothyroidism Asthma  Family History: Last updated: 05/13/2009 CAD-father HTN-father DM-no STROKE-no COLON CA-brother, dx'd at age 49 BREAST CA-no  Review of Systems      See HPI  Physical Exam  General:  Well-developed,well-nourished,in no acute distress; alert,appropriate and cooperative throughout examination Neck:  No deformities, masses, or tenderness noted. Lungs:  Normal respiratory effort, chest expands symmetrically. Lungs are clear to auscultation, no crackles or wheezes. Heart:  Normal rate and regular rhythm. S1 and S2 normal without gallop, murmur, click, rub or other extra sounds. Abdomen:  soft, mildly TTP diffusely.  not distended.  no rebound or guarding.  + BS x4 Inguinal Nodes:  No significant adenopathy Psych:  very anxious.  tearful throughout visit.   Impression & Recommendations:  Problem # 1:  ABDOMINAL PAIN (ICD-789.00) Assessment Unchanged pt's  pain is vague, nonspecific.  given pt's family hx of early colon cancer will check CA125.  also check CBC, BMP, and Hpylori.  Zofran as needed for nausea.  bentyl as needed for abd spasm.  reviewed supportive care and red flags that should prompt return.  Pt expresses understanding and is in agreement w/ this plan. Orders: T-CA 125 ([id number]) Specimen Handling (244[address])  Problem # 2:  GAS/BLOATING (ICD-787.3) Assessment: New reviewed dietary changes, check H pylori.  continue OTC gas meds as needed. Orders: Venipuncture (02725) T-CA 125  ([id number]) Specimen Handling (25956)  Problem # 3:  FAMILY PLANNING (ICD-V25.09) Assessment: New reviewed how to count days to ovulation and which days are most fertile.  pt appreciative of the info.  will follow.  Problem # 4:  VITAMIN D DEFICIENCY (ICD-268.9) Assessment: New due to recheck labs Orders: T-Vitamin D (25-Hydroxy) (38756-43329)  Complete Medication List: 1)  Levothyroxine Sodium 100 Mcg Tabs (Levothyroxine sodium) .Marland Kitchen.. 1 by mouth once daily 2)  Imitrex 50 Mg Tabs (Sumatriptan succinate) .Marland Kitchen.. 1 tab by mouth as needed.  may repeat in 2 hours if sxs persist.  disp 1 month supply 3)  Voltaren 1 % Gel (Diclofenac sodium) .... Two times a day  to qid as needed 4)  Fluticasone Propionate 50 Mcg/act Susp (Fluticasone propionate) .Marland Kitchen.. 1-2 sprays each nostril daily 5)  Clarinex-d 24 Hour 5-240 Mg Xr24h-tab (Desloratadine-pseudoephedrine) .Marland Kitchen.. 1 tab by mouth daily 6)  Vitamin D (ergocalciferol) 50000 Unit Caps (Ergocalciferol) .... Take one tablet weekly 7)  Bentyl 20 Mg Tabs (Dicyclomine hcl) .Marland Kitchen.. 1 tab by mouth three times a day as needed for abdominal spasm 8)  Zofran 8 Mg Tabs (Ondansetron hcl) .Marland Kitchen.. 1 by mouth q 8h prn  Other Orders: UA Dipstick w/o Micro (manual) (51884)  Patient Instructions: 1)  We'll call you with your lab results as soon as they are available 2)  Take the Bentyl as needed for abdominal spasm 3)  Zofran as needed for nausea 4)  RELAX!!!  Things will happen! 5)  Count from day 1 of your period until day 1 of your next period.  count back 2 weeks and this will be the day you ovulate.  best time for sex is 2 days before ovulation through 3-4 days after. 6)  If your pain worsens or you have other concerns- please call or go to the ER 7)  Hang in there!!! Prescriptions: ZOFRAN 8 MG  TABS (ONDANSETRON HCL) 1 by mouth q 8h prn  #30 x 0   Entered and Authorized by:   [name] MD   Signed by:   [name] MD on 05/06/2010   Method used:    Electronically to        [facility] [address] #[id number]* (retail)       [address]       [place], [place]  [address]       Ph: [phone]       Fax: [phone]   RxID:   [id number] BENTYL 20 MG TABS (DICYCLOMINE HCL) 1 tab by mouth three times a day as needed for abdominal spasm  #60 x 1   Entered and Authorized by:   [name] MD   Signed by:   [name] MD on 05/06/2010   Method used:   Electronically to        [facility] [id number]* (retail)       [phone] [address]       [place], [place]  [address]       Ph: [phone]       Fax: [phone]   RxID:   [id number]   Laboratory Results   Urine Tests  Date/Time Received: Lucious Groves [facility]  May 06, 2010 4:12 PM  Date/Time Reported: Lucious Groves [facility]  May 06, 2010 4:12 PM   Routine Urinalysis   Color: yellow Appearance: Clear Glucose: negative   (Normal Range: Negative) Bilirubin: negative   (Normal Range: Negative) Ketone: negative   (Normal Range: Negative) Spec. Gravity: 1.010   (Normal Range: 1.003-1.035) Blood: negative   (Normal Range: Negative) pH: 6.5   (Normal Range: 5.0-8.0) Protein: negative   (Normal Range: Negative) Urobilinogen: 0.2   (Normal Range: 0-1) Nitrite: negative   (Normal Range: Negative) Leukocyte Esterace: negative   (Normal Range: Negative)

## 2010-09-06 NOTE — Letter (Signed)
Summary: Results Follow up Letter  [facility]  [address] [place], [place] [address]   Phone: [phone]  Fax: [phone]    03/26/2009 MRN: [id number]  Narissa TROESCH [address] [place], [place]  [address]  Dear [name],  The following are the results of your recent test(s):  Test         Result    Pap Smear:        Normal _____  Not Normal _____ Comments: ______________________________________________________ Cholesterol: LDL(Bad cholesterol):         Your goal is less than:         HDL (Good cholesterol):       Your goal is more than: Comments:  ______________________________________________________ Mammogram:        Normal _____  Not Normal _____ Comments:  ___________________________________________________________________ Hemoccult:        Normal _____  Not normal _______ Comments:    _____________________________________________________________________ Other Tests: PLEASE SEE COPY OF XRAY DONE 03/24/09    We routinely do not discuss normal results over the telephone.  If you desire a copy of the results, or you have any questions about this information we can discuss them at your next office visit.   Sincerely,

## 2010-09-06 NOTE — Letter (Signed)
Summary: Previsit letter  [facility]  [address] [place], [place] [address]   Phone: [phone]  Fax: [phone]       06/18/2009 MRN: [id number]  Teresa Guzman [address] [place], [place]  [address]  Dear Ms. Teresa Guzman,  Welcome to the Gastroenterology Division at [facility].    You are scheduled to see a nurse for your pre-procedure visit on 07/09/2009 at 11:00AM on the 3rd floor at [facility], [address]  We ask that you try to arrive at our office 15 minutes prior to your appointment time to allow for check-in.  Your nurse visit will consist of discussing your medical and surgical history, your immediate family medical history, and your medications.    Please bring a complete list of all your medications or, if you prefer, bring the medication bottles and we will list them.  We will need to be aware of both prescribed and over the counter drugs.  We will need to know exact dosage information as well.  If you are on blood thinners (Coumadin, Plavix, Aggrenox, Ticlid, etc.) please call our office today/prior to your appointment, as we need to consult with your physician about holding your medication.   Please be prepared to read and sign documents such as consent forms, a financial agreement, and acknowledgement forms.  If necessary, and with your consent, a friend or relative is welcome to sit-in on the nurse visit with you.  Please bring your insurance card so that we may make a copy of it.  If your insurance requires a referral to see a specialist, please bring your referral form from your primary care physician.  No co-pay is required for this nurse visit.     If you cannot keep your appointment, please call [phone] to cancel or reschedule prior to your appointment date.  This allows [place] the opportunity to schedule an appointment for another patient in need of care.    Thank you for choosing [facility] for your medical  needs.  We appreciate the opportunity to care for you.  Please visit [place] at our website  to learn more about our practice.                     Sincerely.                                                                                                                   The Gastroenterology Division

## 2010-09-06 NOTE — Progress Notes (Signed)
Summary: NEEDS PRIOR AUTH--ZOFRAN  Phone Note Refill Request Message from:  Fax from Pharmacy on May 09, 2010 4:51 PM  Refills Requested: Medication #1:  ZOFRAN 8 MG  TABS 1 by mouth q 8h prn. [facility]  [place] Margit Hanks AUTH    [phone]    ID# [id number]  Initial call taken by: Jerolyn Shin,  May 09, 2010 4:52 PM  Follow-up for Phone Call        Form requested, Case [id number].  Lucious Groves CMA  May 10, 2010 9:29 AM   Forms completed and faxed. Will await insurance company reply. Lucious Groves CMA  May 10, 2010 10:04 AM

## 2010-09-06 NOTE — Procedures (Signed)
Summary: Colonoscopy  Patient: Teresa Guzman Note: All result statuses are Final unless otherwise noted.  Tests: (1) Colonoscopy (COL)   COL Colonoscopy           DONE     [facility]     [address]     [place], [place]  [address]           COLONOSCOPY PROCEDURE REPORT           PATIENT:  Teresa Guzman, Teresa Guzman  MR#:  [id number]     BIRTHDATE:  08-09-1980, 28 yrs. old  GENDER:  female           ENDOSCOPIST:  Rachael Fee, MD     Referred by:  Helane Rima Beverely Low, M.D.           PROCEDURE DATE:  07/19/2009     PROCEDURE:  Colonoscopy, Diagnostic     ASA CLASS:  Class I     INDICATIONS:  family history of colon cancer, brother diagnosed     with colon cancer at age 18           MEDICATIONS:   Fentanyl 100 mcg IV, Versed 9 mg IV           DESCRIPTION OF PROCEDURE:   After the risks benefits and     alternatives of the procedure were thoroughly explained, informed     consent was obtained.  Digital rectal exam was performed and     revealed no rectal masses.   The LB [id number] endoscope     was introduced through the anus and advanced to the cecum, which     was identified by both the appendix and ileocecal valve, without     limitations.  The quality of the prep was excellent, using     MoviPrep.  The instrument was then slowly withdrawn as the colon     was fully examined.     <<PROCEDUREIMAGES>>           FINDINGS:  A normal appearing cecum, ileocecal valve, and     appendiceal orifice were identified. The ascending, hepatic     flexure, transverse, splenic flexure, descending, sigmoid colon,     and rectum appeared unremarkable (see image1, image2, and image3).     Retroflexed views in the rectum revealed no abnormalities.    The     scope was then withdrawn from the patient and the procedure     completed.           COMPLICATIONS:  None           ENDOSCOPIC IMPRESSION:     1) Normal colon; no polyps, no cancers.           RECOMMENDATIONS:     1)  Given your significant family history of colon cancer, you     should have a repeat colonoscopy in 5 years           REPEAT EXAM:  5 years           ______________________________     Rachael Fee, MD           n.     eSIGNED:   Rachael Fee at 07/19/2009 02:12 PM           Maryjo Rochester, [id number]  Note: An exclamation mark (!) indicates a result that was not dispersed into the flowsheet. Document Creation Date: 07/19/2009 2:12 PM _______________________________________________________________________  (1) Order result status: Final  Collection or observation date-time: 07/19/2009 14:08 Requested date-time:  Receipt date-time:  Reported date-time:  Referring Physician:   Ordering Physician: Rob Bunting [phone]) Specimen Source:  Source: Launa Grill Order Number: [id number] Lab site:   Appended Document: Colonoscopy    Clinical Lists Changes  Observations: Added new observation of COLONNXTDUE: 07/2014 (07/19/2009 16:24)

## 2010-09-06 NOTE — Assessment & Plan Note (Signed)
Summary: NP W/B HEEL PAIN AND L LATERAL KNEE PAIN X 3 WKS/JW   Vital Signs:  Patient profile:   30 year old female Height:      60 inches Weight:      115 pounds Pulse rate:   90 / minute BP sitting:   113 / 71  Vitals Entered By: Lillia Pauls CMA (November 06, 2008 9:49 AM)  History of Present Illness: 30 y/o WF sent here by Dr Beverely Low with two issues:  1) Left knee pain s/p strong kick to left distal lateral thigh - It started hurting on the lateral side and was associated with a large ecchymosis.  More recently its on the medial side and associated with mild stability and worse with kicking.  2) Bilateral posterior heel pain - This has been going on since she was in high school.  It is worse with standing.  WOrse with kicking.  It is getting worse.  It does not involve the arch of her foot.  She denies weakness or numbness.  She has PMH of hypothyroid.  She does martial arts.  Denies history of plantar fasciitis or achilles tendonitis that she can remember.  Allergies: No Known Drug Allergies  Physical Exam  General:  Well-developed,well-nourished,in no acute distress; alert,appropriate and cooperative throughout examination Msk:  Knee: Normal to inspection with no erythema or effusion or obvious bony abnormalities. Palpation normal with no warmth or joint line tenderness or patellar tenderness or condyle tenderness. ROM normal in flexion and extension and lower leg rotation. Ligaments with solid consistent endpoints including ACL, PCL, LCL, MCL.  She has pain with MCL testing, but no "give" Negative Mcmurray's and provocative meniscal tests. Non painful patellar compression. Patellar and quadriceps tendons unremarkable. Hamstring and quadriceps strength is normal.   Bilateral heels:  Tender spot to palpation at distal posterior calcaneus.  No arch pain.  No pain at plantar fascia area.  No achilles pain or bogginess on palpation.  Normal ROM.  Normal strength.  Ultrasound of  heels:  Rt heel has an area of a small spur at PMT.  Normal appearing achilles.  Lt Heel has a similar small spur at PMT.  There is a fluid collection in the sub achilles area between achilles and calcaneus distally.  No doppler changes for bilateral heels. Pulses:  Normal distal pulses. Neurologic:  Normal distal sensation Skin:  Intact without suspicious lesions or rashes   Impression & Recommendations:  Problem # 1:  FOOT, PAIN (ICD-719.47) Assessment Deteriorated Has some small spurs in bilateral calcaneus that are tender on palpation and visualized with ultrasound.  There is a left sub achilles bursa over the calcaneus.  She was given some orthopedic felt and self-adhesive foam that she can use to create "donut" cutouts to help avoid direct irritation over these bone spurs.  She will f/up as needed.  Also given Voltaren Gel for left side with bursitis.  Problem # 2:  KNEE SPRAIN, LEFT, MEDIAL COLLATERAL LIGAMENT (ICD-844.1) Assessment: New Patient advised that a knee brace can help given this issue some support, but she preferred to rest.  Given the low Grade I sprain, I think this is a safe and reasonable treatment alternative.  Complete Medication List: 1)  Levothyroxine Sodium 100 Mcg Tabs (Levothyroxine sodium) .Marland Kitchen.. 1 by mouth once daily 2)  Tri-sprintec 0.035 Mg Tabs (Norgestimate-ethinyl estradiol) .... Take one tablet daily 3)  Strattera 60 Mg Caps (Atomoxetine hcl) .... Take one tablet daily 4)  Citalopram Hydrobromide 20 Mg  Tabs (Citalopram hydrobromide) .Marland Kitchen.. 1 by mouth once daily 5)  Fluticasone Propionate 50 Mcg/act Susp (Fluticasone propionate) .... 2 sprays each nostril once daily 6)  Imitrex 50 Mg Tabs (Sumatriptan succinate) .Marland Kitchen.. 1 tab by mouth as needed.  may repeat in 2 hours if sxs persist.  disp 1 month supply 7)  Voltaren 1 % Gel (Diclofenac sodium) .... Two times a day  to qid as needed Prescriptions: VOLTAREN 1 %  GEL (DICLOFENAC SODIUM) two times a day  to  qid as needed  #4 x 2   Entered by:   Angeline Slim MD   Authorized by:   Enid Baas MD   Signed by:   Angeline Slim MD on 11/06/2008   Method used:   Electronically to        [id number]* (retail)       [address]       [place], [place]  [address]       Ph: [phone]       Fax: [phone]   RxID:   [id number]

## 2010-09-06 NOTE — Progress Notes (Signed)
Summary: [facility]--rx  Phone Note Refill Request   Refills Requested: Medication #1:  lexapro 10 mg   Last Refilled: 10/16/2008 [facility] [place] [phone]------This medication is requiring a pa-[phone] insurance will pay for citalopram, fluvoxamine, can we switch?  Initial call taken by: Freddy Jaksch,  October 16, 2008 9:02 AM  Follow-up for Phone Call        already changed to citalopram and faxed 10/13/08. Spoke with pharmacist  Follow-up by: Kandice Hams,  October 16, 2008 9:33 AM

## 2010-09-06 NOTE — Assessment & Plan Note (Signed)
Summary: uti?/kdc   Vital Signs:  Patient profile:   30 year old female Height:      60 inches Weight:      119 pounds BMI:     23.32 Temp:     99.1 degrees F oral BP sitting:   130 / 70  (left arm)  Vitals Entered By: Doristine Devoid (November 17, 2009 1:49 PM) CC: uti sx x3 days frequency and pelvic pressure    History of Present Illness: Teresa Guzman here today for ? UTI.  reports frequency and pelvic pressure/LBP.  also notes cloudy urine.  sxs started 3 days ago.  no dysuria, hematuria (currently having period).    hypothyroid- out of medication, needs a refill.  allergies- can no longer use [facility] for OTC meds.  wants script for prescription antihistamine w/ decongestant.  Current Medications (verified): 1)  Levothyroxine Sodium 100 Mcg Tabs (Levothyroxine Sodium) .Marland Kitchen.. 1 By Mouth Once Daily 2)  Imitrex 50 Mg Tabs (Sumatriptan Succinate) .Marland Kitchen.. 1 Tab By Mouth As Needed.  May Repeat in 2 Hours If Sxs Persist.  Disp 1 Month Supply 3)  Voltaren 1 %  Gel (Diclofenac Sodium) .... Two Times A Day  To Qid As Needed 4)  Fluticasone Propionate 50 Mcg/act Susp (Fluticasone Propionate) .Marland Kitchen.. 1-2 Sprays Each Nostril Daily 5)  Zithromax Z-Pak 250 Mg Tabs (Azithromycin) .... 2 Tabs By Mouth X 1 Today, Then One Tab By Mouth Daily X 4 More Days 6)  Tessalon Perles 100 Mg Caps (Benzonatate) .... One Cap By Mouth Three Times A Day As Needed Cough 7)  Cephalexin 500 Mg  Tabs (Cephalexin) .... Take One By Mouth Two Times A Day X5 Days 8)  Clarinex-D 24 Hour 5-240 Mg Xr24h-Tab (Desloratadine-Pseudoephedrine) .Marland Kitchen.. 1 Tab By Mouth Daily  Allergies (verified): No Known Drug Allergies  Review of Systems      See HPI  Physical Exam  General:  Well-developed,well-nourished,in no acute distress; alert,appropriate and cooperative throughout examination Nose:  edematous turbinates w/ congestion Mouth:  + PND Neck:  No deformities, masses, or tenderness noted. Abdomen:  no CVA tenderness.  mild suprapubic  tenderness.   Impression & Recommendations:  Problem # 1:  UTI (ICD-599.0) Assessment New pt's sxs of frequency may or may not be realted to infxn.  urine w/ blood but pt has period.  frequency, LBP, bloating may be hormonally related and not due to infxn.  will cx urine.  start abx in the interim.  Pt expresses understanding and is in agreement w/ this plan. Her updated medication list for this problem includes:    Zithromax Z-pak 250 Mg Tabs (Azithromycin) .Marland Kitchen... 2 tabs by mouth x 1 today, then one tab by mouth daily x 4 more days    Cephalexin 500 Mg Tabs (Cephalexin) .Marland Kitchen... Take one by mouth two times a day x5 days  Orders: Specimen Handling ([address]) T-Culture, Urine ([address]) UA Dipstick w/o Micro (manual) (81002)  Complete Medication List: 1)  Levothyroxine Sodium 100 Mcg Tabs (Levothyroxine sodium) .Marland Kitchen.. 1 by mouth once daily 2)  Imitrex 50 Mg Tabs (Sumatriptan succinate) .Marland Kitchen.. 1 tab by mouth as needed.  may repeat in 2 hours if sxs persist.  disp 1 month supply 3)  Voltaren 1 % Gel (Diclofenac sodium) .... Two times a day  to qid as needed 4)  Fluticasone Propionate 50 Mcg/act Susp (Fluticasone propionate) .Marland Kitchen.. 1-2 sprays each nostril daily 5)  Zithromax Z-pak 250 Mg Tabs (Azithromycin) .... 2 tabs by mouth x 1 today, then  one tab by mouth daily x 4 more days 6)  Tessalon Perles 100 Mg Caps (Benzonatate) .... One cap by mouth three times a day as needed cough 7)  Cephalexin 500 Mg Tabs (Cephalexin) .... Take one by mouth two times a day x5 days 8)  Clarinex-d 24 Hour 5-240 Mg Xr24h-tab (Desloratadine-pseudoephedrine) .Marland Kitchen.. 1 tab by mouth daily  Patient Instructions: 1)  Please schedule your physical at your convenience- don't eat before this appt 2)  Drink plenty of fluids 3)  Take the antibiotics as directed- if your culture is negative we'll call you and have you stop 4)  Call with any questions or concerns 5)  CONGRATS ON YOUR ENGAGEMENT!!! Prescriptions: CLARINEX-D 24 HOUR  5-240 MG XR24H-TAB (DESLORATADINE-PSEUDOEPHEDRINE) 1 tab by mouth daily  #30 x 3   Entered and Authorized by:   [name] MD   Signed by:   [name] MD on 11/17/2009   Method used:   Electronically to        [facility] #[id number]* (retail)       [address]       [place], [place]  [address]       Ph: [phone]       Fax: [phone]   RxID:   [id number] CEPHALEXIN 500 MG  TABS (CEPHALEXIN) take one by mouth two times a day x5 days  #10 x 0   Entered and Authorized by:   [name] MD   Signed by:   [name] MD on 11/17/2009   Method used:   Electronically to        [facility] #[id number]* (retail)       [address]       [place], [place]  [address]       Ph: [phone]       Fax: [phone]   RxID:   [id number] LEVOTHYROXINE SODIUM 100 MCG TABS (LEVOTHYROXINE SODIUM) 1 by mouth once daily  #30 x 6   Entered and Authorized by:   [name] MD   Signed by:   [name] MD on 11/17/2009   Method used:   Electronically to        [facility] [address] [phone]* (retail)       [address]       [place], [place]  [address]       Ph: [phone]       Fax: [phone]   RxID:   [id number]   Laboratory Results   Urine Tests    Routine Urinalysis   Glucose: negative   (Normal Range: Negative) Bilirubin: negative   (Normal Range: Negative) Ketone: negative   (Normal Range: Negative) Spec. Gravity: 1.015   (Normal Range: 1.003-1.035) Blood: trace-lysed   (Normal Range: Negative) pH: 6.0   (Normal Range: 5.0-8.0) Protein: negative   (Normal Range: Negative) Urobilinogen: 0.2   (Normal Range: 0-1) Nitrite: negative   (Normal Range: Negative) Leukocyte Esterace: negative   (Normal Range: Negative)

## 2010-09-06 NOTE — Progress Notes (Signed)
Summary: [facility]-APPROVED [facility]  Phone Note From Pharmacy   Caller: [facility]  [phone] Summary of Call: PRIOR AUTH APPROVED  FOR [facility]  FROM 09/22/08 TO 10/13/2009 PER [facility]. [facility] FAXED Initial call taken by: Kandice Hams,  October 14, 2008 11:44 AM

## 2010-09-06 NOTE — Progress Notes (Signed)
Summary: Zofran denied  Phone Note Other Incoming Call back at [facility] Phone [phone]   Summary of Call: The office received fax from [facility] noting that the patient Zofran has been denied. Initial call taken by: Lucious Groves CMA,  May 12, 2010 9:17 AM  Follow-up for Phone Call        noted.  please give pt phenergan 25mg  1 tab by mouth Q6 as needed for nausea.  #30.  no refills. Follow-up by: [name] MD,  May 12, 2010 9:26 AM  Additional Follow-up for Phone Call Additional follow up Details #1::        Left message on voicemail to call back to office. Lucious Groves CMA  May 12, 2010 9:35 AM   Returned call to patient, left message on voicemail to call back to office. Lucious Groves CMA  May 12, 2010 10:02 AM   Patient notified. Lucious Groves CMA  May 12, 2010 12:41 PM     New/Updated Medications: PROMETHAZINE HCL 25 MG TABS (PROMETHAZINE HCL) 1 by mouth q6h as needed nausea Prescriptions: PROMETHAZINE HCL 25 MG TABS (PROMETHAZINE HCL) 1 by mouth q6h as needed nausea  #30 x 0   Entered by:   Lucious Groves CMA   Authorized by:   [name] MD   Signed by:   Lucious Groves CMA on 05/12/2010   Method used:   Electronically to        [id number]* (retail)       [address]       [place], [place]  [address]       Ph: [phone]       Fax: [phone]   RxID:   [id number]

## 2010-09-06 NOTE — Progress Notes (Signed)
Summary: migraine med//[facility]  Phone Note Call from Patient   Caller: Patient Summary of Call: pt called and left msg in ref to her migraine med said 1 pill was called in, and Dr Beverely Low said she would fix rx  to get refills, pt says this was discussed at cpx ov 10/29/08, Dr Beverely Low please advise I see last rx 10/02/08 #1 wth 3 rf Initial call taken by: Kandice Hams,  Dec 09, 2008 3:34 PM  Follow-up for Phone Call        script was for 1 month supply- not 1 pill.  please fix w/ pharmacy Follow-up by: [name] MD,  Dec 09, 2008 3:42 PM  Additional Follow-up for Phone Call Additional follow up Details #1::        Spoke with pharmacist [facility] informed  script was to be for 1 month supply with 3 refills not 1 pill. pharmacist will fix script.  left msg for pt .Kandice Hams  Dec 09, 2008 4:40 PM  Additional Follow-up by: Kandice Hams,  Dec 09, 2008 4:40 PM

## 2010-09-06 NOTE — Assessment & Plan Note (Signed)
Summary: CPX/KDC   Vital Signs:  Patient profile:   30 year old female Height:      60 inches Weight:      117 pounds BMI:     22.93 Pulse rate:   82 / minute BP sitting:   114 / 72  (left arm)  Vitals Entered By: Doristine Devoid (Dec 10, 2009 8:10 AM) CC: CPX w/ pap and labs    History of Present Illness: 30 yo woman here today for CPE.  no concerns today.  2 weeks until her wedding.  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        Drug Use:  never.    Problems Prior to Update: 1)  Screening For Malignant Neoplasm of The Cervix  (ICD-V76.2) 2)  Routine Gynecological Examination  (ICD-V72.31) 3)  Uti  (ICD-599.0) 4)  Nausea  (ICD-787.02) 5)  Acute Bronchitis  (ICD-466.0) 6)  Polydipsia  (ICD-783.5) 7)  Neoplasm, Malignant, Colon, Family Hx, Sibling  (ICD-V16.0) 8)  Injury of Face and Neck Other and Unspecified  (ICD-959.09) 9)  Knee Sprain, Left, Medial Collateral Ligament  (ICD-844.1) 10)  Foot, Pain  (ICD-719.47) 11)  Add  (ICD-314.00) 12)  Rhinitis  (ICD-477.9) 13)  Migraine  (ICD-346.90) 14)  Abdominal Pain  (ICD-789.00) 15)  Healthy Adult Female  (ICD-V70.0) 16)  Bipolar Affective Disorder, Hx of  (ICD-V11.8) 17)  Hypothyroidism  (ICD-244.9) 18)  Anemia-nos  (ICD-285.9)  Current Medications (verified): 1)  Levothyroxine Sodium 100 Mcg Tabs (Levothyroxine Sodium) .Marland Kitchen.. 1 By Mouth Once Daily 2)  Imitrex 50 Mg Tabs (Sumatriptan Succinate) .Marland Kitchen.. 1 Tab By Mouth As Needed.  May Repeat in 2 Hours If Sxs Persist.  Disp 1 Month Supply 3)  Voltaren 1 %  Gel (Diclofenac Sodium) .... Two Times A Day  To Qid As Needed 4)  Fluticasone Propionate 50 Mcg/act Susp (Fluticasone Propionate) .Marland Kitchen.. 1-2 Sprays Each Nostril Daily 5)  Clarinex-D 24 Hour 5-240 Mg  Xr24h-Tab (Desloratadine-Pseudoephedrine) .Marland Kitchen.. 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    Physical Exam  General:  Well-developed,well-nourished,in no acute distress; alert,appropriate and cooperative throughout examination Head:  Normocephalic and atraumatic without obvious abnormalities. No apparent alopecia or balding. Eyes:  No corneal or conjunctival inflammation noted. EOMI. Perrla. Funduscopic exam benign, without hemorrhages, exudates or papilledema. Vision grossly normal. Ears:  External ear exam shows no significant lesions or deformities.  Otoscopic examination reveals clear canals, tympanic membranes are intact bilaterally without bulging, retraction, inflammation or discharge. Hearing is grossly normal bilaterally. Nose:  External nasal examination shows no deformity or inflammation.  Nasal mucosa are pink and moist without lesions or exudates. Mouth:  Oral mucosa and oropharynx without lesions or exudates.  Teeth in good repair. Neck:  No deformities, masses, or tenderness noted. Breasts:  No mass, nodules,  thickening, tenderness, bulging, retraction, inflamation, nipple discharge or skin changes noted.   Lungs:  Normal respiratory effort, chest expands symmetrically. Lungs are clear to auscultation, no crackles or wheezes. Heart:  Normal rate and regular rhythm. S1 and S2 normal without gallop, murmur, click, rub or other extra sounds. Abdomen:  Bowel sounds positive,abdomen soft and non-tender without masses, organomegaly or hernias noted. Genitalia:  Pelvic Exam:        External: normal female genitalia without lesions or masses        Vagina: normal without lesions or masses        Cervix: normal without lesions or masses        Adnexa: normal bimanual exam without masses or fullness        Uterus: normal by palpation        Pap smear: performed Msk:  No deformity or scoliosis noted of thoracic or lumbar spine.   Pulses:  +2 carotid, radial, DP Extremities:  No clubbing, cyanosis, edema, or deformity noted with normal full range of motion of all joints.   Neurologic:  No cranial nerve deficits noted. Station and gait are normal. Plantar reflexes are down-going bilaterally. DTRs are symmetrical throughout. Sensory, motor and coordinative functions appear intact. Skin:  Intact without suspicious lesions or rashes Cervical Nodes:  No lymphadenopathy noted Axillary Nodes:  No palpable lymphadenopathy Psych:  Cognition and judgment appear intact. Alert and cooperative with normal attention span and concentration. No apparent delusions, illusions, hallucinations   Impression & Recommendations:  Problem # 1:  ROUTINE GYNECOLOGICAL EXAMINATION (ICD-V72.31) Assessment New Pt's PE WNL.  check labs as below.  anticipatory guidance provided. Orders: Venipuncture ([address]) TLB-Lipid Panel (80061-LIPID) TLB-BMP (Basic Metabolic Panel-BMET) (80048-METABOL) TLB-CBC Platelet - w/Differential (85025-CBCD) TLB-Hepatic/Liver Function Pnl (80076-HEPATIC) TLB-TSH (Thyroid Stimulating Hormone)  (84443-TSH) T-Vitamin D (25-Hydroxy) ([address])  Problem # 2:  SCREENING FOR MALIGNANT NEOPLASM OF THE CERVIX (ICD-V76.2) Assessment: New pap collected  Complete Medication List: 1)  Levothyroxine Sodium 100 Mcg Tabs (Levothyroxine sodium) .Marland Kitchen.. 1 by mouth once daily 2)  Imitrex 50 Mg Tabs (Sumatriptan succinate) .Marland Kitchen.. 1 tab by mouth as needed.  may repeat in 2 hours if sxs persist.  disp 1 month supply 3)  Voltaren 1 % Gel (Diclofenac sodium) .... Two times a day  to qid as needed 4)  Fluticasone Propionate 50 Mcg/act Susp (Fluticasone propionate) .Marland Kitchen.. 1-2 sprays each nostril daily 5)  Clarinex-d 24 Hour 5-240 Mg Xr24h-tab (Desloratadine-pseudoephedrine) .Marland Kitchen.. 1 tab by mouth daily  Patient Instructions: 1)  Please follow up in 1 year or as needed 2)  Keep up the good work on diet and exercise- you look great! 3)  We'll notify you of your lab results 4)  Call with any questions or concerns! 5)  HAVE A WONDERFUL WEDDING!!!  you'll be a beautiful bride!!

## 2010-09-06 NOTE — Progress Notes (Signed)
Summary: fluticasone refill   Phone Note Refill Request   Refills Requested: Medication #1:  FLUTICASONE NASAL SP 120 IN [facility] ON [address]--PH-[phone]  Initial call taken by: Freddy Jaksch,  August 02, 2009 2:01 PM Summary of Call: FLUTICASONE    New/Updated Medications: FLUTICASONE PROPIONATE 50 MCG/ACT SUSP (FLUTICASONE PROPIONATE) 1-2 sprays each nostril daily Prescriptions: FLUTICASONE PROPIONATE 50 MCG/ACT SUSP (FLUTICASONE PROPIONATE) 1-2 sprays each nostril daily  #1 x 3   Entered by:   Doristine Devoid   Authorized by:   [name] MD   Signed by:   Doristine Devoid on 08/02/2009   Method used:   Electronically to        [facility] [address] #[id number]* (retail)       [address]       [place], [place]  [address]       Ph: [phone]       Fax: [phone]   RxID:   [id number]

## 2010-09-06 NOTE — Assessment & Plan Note (Signed)
Summary: 6 MONTH RECHECK--PH   Vital Signs:  Patient profile:   30 year old female Weight:      114.8 pounds Pulse rate:   70 / minute BP sitting:   106 / 60  (right arm)  Vitals Entered By: Doristine Devoid (May 13, 2009 9:25 AM) CC: 6 month roa- also needs colonoscopy    History of Present Illness: 30 yo woman here today for  1) Hypothyroid- some cold intolerance, no dry or brittle hair or nails.  tolerating synthroid w/out difficulty.  2) Family hx of colon cancer-  brother recently dx'd at age 44.  needs referral for colonoscopy.  3) Polydipsia/Polyuria- reports increased thirst for the last month.  drinks only water which causes increased urination.  denies increased appetite.  strong family hx of DM.  also reports urine has been dark in color despite increased water intake.  4) PND- Using Claritin D but reports 1 month ago sxs worsened.  not using Nasonex.  Problems Prior to Update: 1)  Polydipsia  (ICD-783.5) 2)  Neoplasm, Malignant, Colon, Family Hx, Sibling  (ICD-V16.0) 3)  Injury of Face and Neck Other and Unspecified  (ICD-959.09) 4)  Knee Sprain, Left, Medial Collateral Ligament  (ICD-844.1) 5)  Foot, Pain  (ICD-719.47) 6)  Add  (ICD-314.00) 7)  Rhinitis  (ICD-477.9) 8)  Migraine  (ICD-346.90) 9)  Abdominal Pain  (ICD-789.00) 10)  Healthy Adult Female  (ICD-V70.0) 11)  Bipolar Affective Disorder, Hx of  (ICD-V11.8) 12)  Hypothyroidism  (ICD-244.9) 13)  Anemia-nos  (ICD-285.9)  Current Medications (verified): 1)  Levothyroxine Sodium 100 Mcg Tabs (Levothyroxine Sodium) .Marland Kitchen.. 1 By Mouth Once Daily 2)  Imitrex 50 Mg Tabs (Sumatriptan Succinate) .Marland Kitchen.. 1 Tab By Mouth As Needed.  May Repeat in 2 Hours If Sxs Persist.  Disp 1 Month Supply 3)  Voltaren 1 %  Gel (Diclofenac Sodium) .... 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 ENT:  Complains of nasal congestion and postnasal drainage. GI:  Denies abdominal pain, bloody stools, change in bowel habits, constipation, and dark tarry stools. Endo:  Complains of cold intolerance, excessive thirst, excessive urination, and polyuria; denies excessive hunger, heat intolerance, and weight change.  Physical Exam  General:  well-nourished,in no acute distress; alert,appropriate and cooperative throughout examination Nose:  edematous turbinates w/ congestion Mouth:  + PND Neck:  No deformities, masses, or tenderness noted. Lungs:  Normal respiratory effort, chest expands symmetrically. Lungs are clear to auscultation, no crackles or wheezes. Heart:  Normal rate and regular rhythm. S1 and S2 normal without gallop, murmur, click, rub or other extra sounds.   Impression & Recommendations:  Problem # 1:  HYPOTHYROIDISM (ICD-244.9) Assessment Unchanged due for lab check.  adjust meds as needed. Her updated medication list for this problem includes:    Levothyroxine Sodium 100 Mcg Tabs (Levothyroxine sodium) .Marland Kitchen... 1 by mouth once daily  Orders: TLB-TSH (Thyroid Stimulating Hormone) (84443-TSH)  Problem # 2:  NEOPLASM, MALIGNANT, COLON, FAMILY HX, SIBLING (ICD-V16.0) Assessment: New brother recently dx'd w/ colon cancer, needs referral for colonoscopy  Problem #  3:  POLYDIPSIA (ICD-783.5) Assessment: New pt reports being constantly thirsty.  increased thirst corresponds w/ increased PND.  given strong family hx will check labs to r/o DM.  increase in  urination likely directly related to increase in water consumption. Orders: Venipuncture ([address]) TLB-BMP (Basic Metabolic Panel-BMET) (80048-METABOL)  Problem # 4:  RHINITIS (ICD-477.9) Assessment: Unchanged pt's increase in post nasal drip likely related to rag weed season.  resume nasal steroid spray.  Complete Medication List: 1)  Levothyroxine Sodium 100 Mcg Tabs (Levothyroxine sodium) .Marland Kitchen.. 1 by mouth once daily 2)  Imitrex 50 Mg Tabs (Sumatriptan succinate) .Marland Kitchen.. 1 tab by mouth as needed.  may repeat in 2 hours if sxs persist.  disp 1 month supply 3)  Voltaren 1 % Gel (Diclofenac sodium) .... Two times a day  to qid as needed  Other Orders: Admin 1st Vaccine ([address]) Flu Vaccine 67yrs + [phone])  Patient Instructions: 1)  Please schedule a follow-up appointment in 6 months for your complete physical. 2)  Someone will call you with your GI appt 3)  Start the nasonex daily- 2 sprays each nostril 4)  We'll notify you of your lab results- if they are normal the thirst is likely a normal variation 5)  Call with any questions or concerns 6)  Hang in there!!       Flu Vaccine Consent Questions     Do you have a history of severe allergic reactions to this vaccine? no    Any prior history of allergic reactions to egg and/or gelatin? no    Do you have a sensitivity to the preservative Thimersol? no    Do you have a past history of Guillan-Barre Syndrome? no    Do you currently have an acute febrile illness? no    Have you ever had a severe reaction to latex? no    Vaccine information given and explained to patient? yes    Are you currently pregnant? no    Lot Number:[id number]   Exp Date:02/03/2010   Site Given  Left Deltoid IMlbflu

## 2010-09-06 NOTE — Miscellaneous (Signed)
Summary: Lec previsit  Clinical Lists Changes  Medications: Added new medication of MOVIPREP 100 GM  SOLR (PEG-KCL-NACL-NASULF-NA ASC-C) As per prep instructions. - Signed Rx of MOVIPREP 100 GM  SOLR (PEG-KCL-NACL-NASULF-NA ASC-C) As per prep instructions.;  #1 x 0;  Signed;  Entered by: Ulis Rias RN;  Authorized by: Rachael Fee MD;  Method used: Electronically to [id number]*, [address], [place], [place], [place]  [address], Ph: [phone], Fax: [phone] Observations: Added new observation of ALLERGY REV: Done (07/09/2009 10:30)    Prescriptions: MOVIPREP 100 GM  SOLR (PEG-KCL-NACL-NASULF-NA ASC-C) As per prep instructions.  #1 x 0   Entered by:   Ulis Rias RN   Authorized by:   Rachael Fee MD   Signed by:   Ulis Rias RN on 07/09/2009   Method used:   Electronically to        [facility] #[id number]* (retail)       [address]       [place], [place]  [address]       Ph: [phone]       Fax: [phone]   RxID:   [id number]

## 2010-09-06 NOTE — Progress Notes (Signed)
  Phone Note Refill Request   Refills Requested: Medication #1:  STRATTERA 60 MG CAPS take one tablet daily   Last Refilled: 10/09/2008  Medication #2:  LEXAPRO 10 MG TABS take one tablet daily   Last Refilled: 10/09/2008 Prior Authorization--[id number][facility] [phone]  Initial call taken by: Freddy Jaksch,  October 09, 2008 12:41 PM  Follow-up for Phone Call        PER PHARMACIST INS PREFERS CITALOPRAM DR Beverely Low ADVISE OR MOVE FORWARD WITH PRIOR AUTH FOR LEXAPRO ALSO? Follow-up by: Kandice Hams,  October 12, 2008 4:36 PM  Additional Follow-up for Phone Call Additional follow up Details #1::        pt came from [facility] on Lexapro.  Can start Citalopram 20mg - will be starting care with Psych to manage her meds.  Can tell her that meds were switched to comply w/ insurance. Additional Follow-up by: [name] MD,  October 12, 2008 4:57 PM    Additional Follow-up for Phone Call Additional follow up Details #2::    [facility] , left msg for pt to call Kandice Hams  October 13, 2008 10:40 AM  Follow-up by: Kandice Hams,  October 13, 2008 10:40 AM  New/Updated Medications: CITALOPRAM HYDROBROMIDE 20 MG TABS (CITALOPRAM HYDROBROMIDE) 1 by mouth once daily   Prescriptions: CITALOPRAM HYDROBROMIDE 20 MG TABS (CITALOPRAM HYDROBROMIDE) 1 by mouth once daily  #30 x 0   Entered by:   Kandice Hams   Authorized by:   [name] MD   Signed by:   Kandice Hams on 10/13/2008   Method used:   Faxed to ...       [facility] [address] #[id number]* (retail)       [address]       [place], [place]  [address]       Ph: [phone]       Fax: [phone]   RxID:   [id number]

## 2010-09-06 NOTE — Progress Notes (Signed)
Summary: imitrex refill   Phone Note Refill Request Call back at [phone] Message from:  Pharmacy on Dec 27, 2009 8:54 AM  Refills Requested: Medication #1:  IMITREX 50 MG TABS 1 tab by mouth as needed.  may repeat in 2 hours if sxs persist.  disp 1 month supply   Dosage confirmed as above?Dosage Confirmed   Brand Name Necessary? No   Supply Requested: 1 month   Last Refilled: 12/09/2008 [facility] [address]   Next Appointment Scheduled: none Initial call taken by: Harold Barban,  Dec 27, 2009 8:54 AM    rPrescriptions: IMITREX 50 MG TABS (SUMATRIPTAN SUCCINATE) 1 tab by mouth as needed.  may repeat in 2 hours if sxs persist.  disp 1 month supply  #1 x 3   Entered by:   Doristine Devoid   Authorized by:   [name] MD   Signed by:   Doristine Devoid on 12/27/2009   Method used:   Electronically to        [facility] [address] #[id number]* (retail)       [address]       [place], [place]  [address]       Ph: [phone]       Fax: [phone]   RxID:   [id number]

## 2010-09-06 NOTE — Letter (Signed)
Summary: Results Follow up Letter  [facility] [place]  [address] [place], [place] [address]   Phone: [phone]  Fax: [phone]    12/15/2009 MRN: [id number]  Zohal TROESCH [address] [place], [place]  [address]  Dear [name],  The following are the results of your recent test(s):  Test         Result    Pap Smear:        Normal _X____  Not Normal _____ Comments: _____RESCREEN_IN_1_YEAR______________________________________________ Cholesterol: LDL(Bad cholesterol):         Your goal is less than:         HDL (Good cholesterol):       Your goal is more than: Comments:  ______________________________________________________ Mammogram:        Normal _____  Not Normal _____ Comments:  ___________________________________________________________________ Hemoccult:        Normal _____  Not normal _______ Comments:    _____________________________________________________________________ Other Tests:    We routinely do not discuss normal results over the telephone.  If you desire a copy of the results, or you have any questions about this information we can discuss them at your next office visit.    Sincerely,   OFFICE OF DR Beverely Low, MD

## 2010-09-06 NOTE — Progress Notes (Signed)
Summary: zofran refill   Phone Note Refill Request Message from:  Fax from Pharmacy on May 09, 2010 2:19 PM  Refills Requested: Medication #1:  ZOFRAN 8 MG  TABS 1 by mouth q 8h prn. [facility] - fax [phone]  Initial call taken by: Okey Regal [place],  May 09, 2010 2:20 PM  Follow-up for Phone Call        was done 05/06/10 w/ #30 shouldn't need refill ..........Marland KitchenDoristine Devoid CMA  May 09, 2010 2:35 PM

## 2010-09-06 NOTE — Progress Notes (Signed)
Summary: Teresa Guzman--rx see please  Phone Note Refill Request   Refills Requested: Medication #1:  levothyroxine 0.100 mg #30 [facility] on [facility] [phone]  Initial call taken by: Freddy Jaksch,  September 16, 2008 3:58 PM  Follow-up for Phone Call        Dr Beverely Low this is new pt who has a appt 09/11/08 and cancelled reschedule to 10/02/08 (ok to advise will fill at ov? Follow-up by: Kandice Hams,  September 17, 2008 10:28 AM  Additional Follow-up for Phone Call Additional follow up Details #1::        ok to give #30 w/out refills.  will not give any more refills if pt misses appt. Additional Follow-up by: [name] MD,  September 17, 2008 10:46 AM    New/Updated Medications: LEVOTHYROXINE SODIUM 100 MCG TABS (LEVOTHYROXINE SODIUM) 1 by mouth once daily   Prescriptions: LEVOTHYROXINE SODIUM 100 MCG TABS (LEVOTHYROXINE SODIUM) 1 by mouth once daily  #30 x 0   Entered by:   Kandice Hams   Authorized by:   [name] MD   Signed by:   Kandice Hams on 09/17/2008   Method used:   Faxed to ...       [facility] [address] [phone]* (retail)       [address]       [place], [place]  [address]       Ph: [phone]       Fax: [phone]   RxID:   [id number]   Appended Document: Teresa Guzman--rx see please left msg for pt rx filled need to keep appt on 10/02/08

## 2010-09-06 NOTE — Progress Notes (Signed)
Summary: Refill/results request  Phone Note Refill Request Call back at [phone] Message from:  Pharmacy on May 10, 2010 10:57 AM  Refills Requested: Medication #1:  ZOFRAN 8 MG  TABS 1 by mouth q 8h prn.   Dosage confirmed as above?Dosage Confirmed   Brand Name Necessary? No   Supply Requested: 1 month   Last Refilled: [facility]/10/2010 [facility] [address]   Next Appointment Scheduled: none Initial call taken by: Harold Barban,  May 10, 2010 10:57 AM  Follow-up for Phone Call        Patient left message on triage requesting test results., but I see in other note MD prefers to speak with patient. Follow-up by: Lucious Groves CMA,  May 10, 2010 1:31 PM  Additional Follow-up for Phone Call Additional follow up Details #1::        attempted to call pt- left message on VM that i will again try and contact her in AM Additional Follow-up by: [name] MD,  May 10, 2010 5:26 PM  New Problems: OTHER AND UNSPECIFIED OVARIAN CYST (ICD-620.2)   Additional Follow-up for Phone Call Additional follow up Details #2::    discussed results of CT scan.  pt aware that she has bilateral ovarian cysts and further w/u is needed to identify cause of cysts.  will refer for MRI and GYN.  please call pt w/ appts when available. Follow-up by: [name] MD,  May 11, 2010 8:05 AM  New Problems: OTHER AND UNSPECIFIED OVARIAN CYST (ICD-620.2)

## 2010-09-06 NOTE — Letter (Signed)
Summary: Results Follow up Letter  [facility] [place]  [address] [place], [place] [address]   Phone: [phone]  Fax: [phone]    11/04/2008 MRN: [id number]  Teresa Guzman [address] [place], [place]  [address]  Dear [name],  The following are the results of your recent test(s):  Test         Result    Pap Smear: REPEAT 1 YR.    Normal __X___  Not Normal _____ Comments: ______________________________________________________ Cholesterol: LDL(Bad cholesterol):         Your goal is less than:         HDL (Good cholesterol):       Your goal is more than: Comments:  ______________________________________________________ Mammogram:        Normal _____  Not Normal _____ Comments:  ___________________________________________________________________ Hemoccult:        Normal _____  Not normal _______ Comments:    _____________________________________________________________________ Other Tests:    We routinely do not discuss normal results over the telephone.  If you desire a copy of the results, or you have any questions about this information we can discuss them at your next office visit.   Sincerely,

## 2010-09-09 NOTE — Medication Information (Signed)
Summary: Prior Authorization & Approval for [employer]  Prior Authorization & Approval for [employer]   Imported By: Lanelle Bal 10/16/2008 11:30:02  _____________________________________________________________________  External Attachment:    Type:   Image     Comment:   External Document

## 2010-10-20 ENCOUNTER — Encounter: Payer: Self-pay | Admitting: *Deleted

## 2010-12-14 ENCOUNTER — Encounter: Payer: Self-pay | Admitting: Family Medicine

## 2010-12-14 ENCOUNTER — Ambulatory Visit (INDEPENDENT_AMBULATORY_CARE_PROVIDER_SITE_OTHER): Payer: BC Managed Care – PPO | Admitting: Family Medicine

## 2010-12-14 ENCOUNTER — Other Ambulatory Visit (HOSPITAL_COMMUNITY)
Admission: RE | Admit: 2010-12-14 | Discharge: 2010-12-14 | Disposition: A | Discharge status: Home / Self Care | Payer: BC Managed Care – PPO | Source: Ambulatory Visit | Attending: Family Medicine | Admitting: Family Medicine

## 2010-12-14 DIAGNOSIS — Z0000 Encounter for general adult medical examination without abnormal findings: Secondary | ICD-10-CM | POA: Insufficient documentation

## 2010-12-14 DIAGNOSIS — Z01419 Encounter for gynecological examination (general) (routine) without abnormal findings: Secondary | ICD-10-CM

## 2010-12-14 DIAGNOSIS — N979 Female infertility, unspecified: Secondary | ICD-10-CM

## 2010-12-14 DIAGNOSIS — Z124 Encounter for screening for malignant neoplasm of cervix: Secondary | ICD-10-CM | POA: Insufficient documentation

## 2010-12-14 LAB — CBC WITH DIFFERENTIAL/PLATELET
Basophils Absolute: 0 10*3/uL (ref 0.0–0.1)
Basophils Relative: 0.8 % (ref 0.0–3.0)
Eosinophils Absolute: 0.1 10*3/uL (ref 0.0–0.7)
Eosinophils Relative: 1.4 % (ref 0.0–5.0)
HCT: 34.9 % — ABNORMAL LOW (ref 36.0–46.0)
Hemoglobin: 11.8 g/dL — ABNORMAL LOW (ref 12.0–15.0)
Lymphocytes Relative: 35 % (ref 12.0–46.0)
Lymphs Abs: 1.8 10*3/uL (ref 0.7–4.0)
MCHC: 33.7 g/dL (ref 30.0–36.0)
MCV: 92.9 fl (ref 78.0–100.0)
Monocytes Absolute: 0.4 10*3/uL (ref 0.1–1.0)
Monocytes Relative: 7.4 % (ref 3.0–12.0)
Neutro Abs: 2.8 10*3/uL (ref 1.4–7.7)
Neutrophils Relative %: 55.4 % (ref 43.0–77.0)
Platelets: 222 10*3/uL (ref 150.0–400.0)
RBC: 3.76 Mil/uL — ABNORMAL LOW (ref 3.87–5.11)
RDW: 14.7 % — ABNORMAL HIGH (ref 11.5–14.6)
WBC: 5.1 10*3/uL (ref 4.5–10.5)

## 2010-12-14 LAB — HEPATIC FUNCTION PANEL
ALT: 9 U/L (ref 0–35)
AST: 14 U/L (ref 0–37)
Albumin: 4.2 g/dL (ref 3.5–5.2)
Alkaline Phosphatase: 48 U/L (ref 39–117)
Bilirubin, Direct: 0 mg/dL (ref 0.0–0.3)
Total Bilirubin: 0.4 mg/dL (ref 0.3–1.2)
Total Protein: 7 g/dL (ref 6.0–8.3)

## 2010-12-14 LAB — LIPID PANEL
Cholesterol: 151 mg/dL (ref 0–200)
HDL: 48.8 mg/dL (ref >39.00)
LDL Cholesterol: 90 mg/dL (ref 0–99)
Total CHOL/HDL Ratio: 3
Triglycerides: 63 mg/dL (ref 0.0–149.0)
VLDL: 12.6 mg/dL (ref 0.0–40.0)

## 2010-12-14 LAB — BASIC METABOLIC PANEL
BUN: 13 mg/dL (ref 6–23)
CO2: 30 mEq/L (ref 19–32)
Calcium: 9 mg/dL (ref 8.4–10.5)
Chloride: 101 mEq/L (ref 96–112)
Creatinine, Ser: 0.7 mg/dL (ref 0.4–1.2)
GFR: 106.91 mL/min (ref >60.00)
Glucose, Bld: 80 mg/dL (ref 70–99)
Potassium: 4.5 mEq/L (ref 3.5–5.1)
Sodium: 138 mEq/L (ref 135–145)

## 2010-12-14 LAB — TSH: TSH: 5.09 u[IU]/mL (ref 0.35–5.50)

## 2010-12-14 MED ORDER — AZELASTINE HCL 0.15 % NA SOLN: 2.0000 | Freq: Two times a day (BID) | NASAL | Status: DC

## 2010-12-14 NOTE — Patient Instructions (Signed)
Follow up in 6 months or as needed Start the Astepro- 2 sprays twice daily Continue the Flonase and the Allegra D We'll call you with your GYN appt Call with any questions or concerns Hang in there!!!

## 2010-12-14 NOTE — Assessment & Plan Note (Signed)
Pt and husband have been trying to get pregnant for >1 yr.  Using ovulation predictors w/out success.  Will refer to GYN for complete evaluation.

## 2010-12-14 NOTE — Assessment & Plan Note (Signed)
Pt's PE WNL.  Check labs.  Anticipatory guidance provided.  

## 2010-12-14 NOTE — Assessment & Plan Note (Signed)
Pap collected. 

## 2010-12-14 NOTE — Progress Notes (Signed)
  Subjective:    Patient ID: [employer], female    DOB: Jun 20, 1981, 30 y.o.   MRN: [id number]  HPI CPE- no concerns today.   Review of Systems Patient reports no vision/ hearing changes, adenopathy,fever, weight change,  persistant/recurrent hoarseness , swallowing issues, chest pain, palpitations, edema, persistant/recurrent cough, hemoptysis, dyspnea (rest/exertional/paroxysmal nocturnal), gastrointestinal bleeding (melena, rectal bleeding), abdominal pain, significant heartburn, bowel changes, GU symptoms (dysuria, hematuria, incontinence), Gyn symptoms (abnormal  bleeding, pain),  syncope, focal weakness, memory loss, numbness & tingling, skin/hair/nail changes, abnormal bruising or bleeding, anxiety, or depression.    Objective:   Physical Exam  General Appearance:    Alert, cooperative, no distress, appears stated age  Head:    Normocephalic, without obvious abnormality, atraumatic  Eyes:    PERRL, conjunctiva/corneas clear, EOM's intact, fundi    benign, both eyes  Ears:    Normal TM's and external ear canals, both ears  Nose:   Nares normal, septum midline, mucosa normal, no drainage    or sinus tenderness  Throat:   Lips, mucosa, and tongue normal; teeth and gums normal  Neck:   Supple, symmetrical, trachea midline, no adenopathy;    Thyroid: no enlargement/tenderness/nodules  Back:     Symmetric, no curvature, ROM normal, no CVA tenderness  Lungs:     Clear to auscultation bilaterally, respirations unlabored  Chest Wall:    No tenderness or deformity   Heart:    Regular rate and rhythm, S1 and S2 normal, no murmur, rub   or gallop  Breast Exam:    No tenderness, masses, or nipple abnormality  Abdomen:     Soft, non-tender, bowel sounds active all four quadrants,    no masses, no organomegaly  Genitalia:    External genitalia normal, cervix normal in appearance, no CMT, uterus in normal size and position, adnexa w/out mass or tenderness, mucosa pink and moist, no lesions or  discharge present  Rectal:    Normal external appearance  Extremities:   Extremities normal, atraumatic, no cyanosis or edema  Pulses:   2+ and symmetric all extremities  Skin:   Skin color, texture, turgor normal, no rashes or lesions  Lymph nodes:   Cervical, supraclavicular, and axillary nodes normal  Neurologic:   CNII-XII intact, normal strength, sensation and reflexes    throughout         Assessment & Plan:

## 2010-12-16 MED ORDER — LEVOTHYROXINE SODIUM 112 MCG PO CAPS: 112.0000 ug | ORAL_CAPSULE | Freq: Every day | ORAL | Status: DC

## 2010-12-16 NOTE — Progress Notes (Signed)
Addended by: Alease Medina on: 12/16/2010 09:12 AM   Modules accepted: Orders

## 2010-12-17 LAB — VITAMIN D 1,25 DIHYDROXY
Vitamin D 1, 25 (OH)2 Total: 52 pg/mL (ref 18–72)
Vitamin D2 1, 25 (OH)2: <8 pg/mL
Vitamin D3 1, 25 (OH)2: 52 pg/mL

## 2010-12-19 ENCOUNTER — Encounter: Payer: Self-pay | Admitting: *Deleted

## 2011-01-11 ENCOUNTER — Other Ambulatory Visit (HOSPITAL_COMMUNITY): Payer: Self-pay | Admitting: Gynecology

## 2011-01-11 DIAGNOSIS — N979 Female infertility, unspecified: Secondary | ICD-10-CM

## 2011-01-19 ENCOUNTER — Ambulatory Visit (HOSPITAL_COMMUNITY)
Admission: RE | Admit: 2011-01-19 | Discharge: 2011-01-19 | Disposition: A | Discharge status: Home / Self Care | Payer: BC Managed Care – PPO | Source: Ambulatory Visit | Attending: Gynecology | Admitting: Gynecology

## 2011-01-19 DIAGNOSIS — N979 Female infertility, unspecified: Secondary | ICD-10-CM

## 2011-05-03 ENCOUNTER — Other Ambulatory Visit: Payer: Self-pay | Admitting: Family Medicine

## 2011-05-03 NOTE — Telephone Encounter (Signed)
Nasonex not on med list

## 2011-05-08 ENCOUNTER — Other Ambulatory Visit: Payer: Self-pay

## 2011-05-08 MED ORDER — FLUTICASONE PROPIONATE 50 MCG/ACT NA SUSP: 1.0000 | Freq: Every day | NASAL | Status: DC

## 2011-06-12 ENCOUNTER — Telehealth: Payer: Self-pay | Admitting: Family Medicine

## 2011-06-12 NOTE — Telephone Encounter (Signed)
Patient wants 30 day supply atepro 0.15 called in to local pharmacy - [facility] [address]

## 2011-06-13 ENCOUNTER — Other Ambulatory Visit: Payer: Self-pay | Admitting: *Deleted

## 2011-06-13 ENCOUNTER — Ambulatory Visit (INDEPENDENT_AMBULATORY_CARE_PROVIDER_SITE_OTHER): Payer: BC Managed Care – PPO | Admitting: Internal Medicine

## 2011-06-13 ENCOUNTER — Encounter: Payer: Self-pay | Admitting: Internal Medicine

## 2011-06-13 VITALS — BP 118/68 | HR 76 | Temp 98.0°F | Resp 98 | Ht 61.0 in | Wt 137.0 lb

## 2011-06-13 DIAGNOSIS — J3489 Other specified disorders of nose and nasal sinuses: Secondary | ICD-10-CM

## 2011-06-13 DIAGNOSIS — J329 Chronic sinusitis, unspecified: Secondary | ICD-10-CM

## 2011-06-13 MED ORDER — AZELASTINE HCL 0.15 % NA SOLN: 2.0000 | Freq: Two times a day (BID) | NASAL | Status: DC

## 2011-06-13 MED ORDER — AMOXICILLIN 500 MG PO CAPS: 1000.0000 mg | ORAL_CAPSULE | Freq: Two times a day (BID) | ORAL | Status: AC

## 2011-06-13 MED ORDER — PREDNISONE 10 MG PO TABS: ORAL_TABLET | ORAL | Status: AC

## 2011-06-13 NOTE — Telephone Encounter (Signed)
Spoke to pt and she clarified that she does need the astepro rx sent to pharmacy by e-script Pt understood

## 2011-06-13 NOTE — Patient Instructions (Addendum)
Rest, fluids , tylenol No afrin, try benadryl  Take the antibiotic as prescribed ----> Amoxicillin Prednisone for few days  Call if no better in few days Call anytime if the symptoms are severe

## 2011-06-13 NOTE — Progress Notes (Signed)
  Subjective:    Patient ID: [employer], female    DOB: [birth year], 30 y.o.   MRN: [id number]  HPI 3 weeks history of a cold, currently the main symptom is facial pain, mostly in the forehead and between the eyes. Some sinus pain. Definitely congested in the sinuses. Has been using Afrin on and off.  PMH: reviewed Med list: reviewed  Review of Systems No fever or chills in the last week No chest congestion, mild if any cough. She does have abundant green nasal discharge and postnasal dripping.     Objective:   Physical Exam  Constitutional: She appears well-developed and well-nourished. No distress.  HENT:       Tympanic membranes bilaterally slightly bulging and pink, no discharge. Face symmetric, mild tenderness to palpation at the frontal sinuses. External ocular movements intact. Nose congested Throat without redness.   Cardiovascular: Normal rate and normal heart sounds.   No murmur heard. Pulmonary/Chest: Effort normal and breath sounds normal. No respiratory distress. She has no wheezes. She has no rales.  Skin: She is not diaphoretic.          Assessment & Plan:  Sinusitis: Symptoms consistent with sinusitis, will prescribe antibiotics and prednisone. The patient is not on birth control pills but states she's not pregnant, she actually has issues with infertility. Needs to call me if she is not getting better, see instructions.

## 2011-06-13 NOTE — Telephone Encounter (Signed)
Noted flonase was last refill sent. Called pt to clarify if she indeed wants the asterpro instead of the flonase. .left message to have patient return my call.

## 2012-02-23 ENCOUNTER — Encounter: Payer: Self-pay | Admitting: Family Medicine

## 2012-02-23 ENCOUNTER — Ambulatory Visit (INDEPENDENT_AMBULATORY_CARE_PROVIDER_SITE_OTHER): Payer: 59 | Admitting: Family Medicine

## 2012-02-23 VITALS — BP 121/76 | HR 98 | Temp 98.4°F | Ht 60.0 in | Wt 143.6 lb

## 2012-02-23 DIAGNOSIS — L309 Dermatitis, unspecified: Secondary | ICD-10-CM | POA: Insufficient documentation

## 2012-02-23 DIAGNOSIS — L259 Unspecified contact dermatitis, unspecified cause: Secondary | ICD-10-CM

## 2012-02-23 MED ORDER — TRIAMCINOLONE ACETONIDE 0.1 % EX OINT: TOPICAL_OINTMENT | Freq: Two times a day (BID) | CUTANEOUS | Status: DC

## 2012-02-23 NOTE — Patient Instructions (Addendum)
This appears to be a dermatitis Start the Triamcinolone ointment twice daily Hold off on the alcohol to avoid over drying Take benadryl as needed for the itching Hang in there!!!

## 2012-02-23 NOTE — Progress Notes (Signed)
  Subjective:    Patient ID: [employer], female    DOB: 01/11/1981, 31 y.o.   MRN: [id number]  HPI Skin problem- 3 patchy areas.  sxs started 3 weeks ago on L lower leg.  Has increased in size and spread to the other 2 areas.  Very itchy.  No drainage although original area developed what looked like a white head.  Not painful.  Started w/ neosporin, benadryl cream, hydrocortisone cream, lately has been using mupirocin.  None help w/ itching.   Review of Systems For ROS see HPI     Objective:   Physical Exam  Vitals reviewed. Constitutional: She appears well-developed and well-nourished. No distress.  Skin: Skin is warm and dry. Rash (patchy, erythematous, somewhat scaly lesions consistent w/ dermatitis) noted.          Assessment & Plan:

## 2012-02-23 NOTE — Assessment & Plan Note (Signed)
New.  Cause unknown.  Start Triamcinolone bid.  Reviewed supportive care and red flags that should prompt return.  Pt expressed understanding and is in agreement w/ plan.

## 2012-03-18 ENCOUNTER — Other Ambulatory Visit: Payer: Self-pay | Admitting: Family Medicine

## 2012-03-18 MED ORDER — FLUTICASONE PROPIONATE 50 MCG/ACT NA SUSP: 1.0000 | Freq: Every day | NASAL | Status: DC

## 2012-03-18 NOTE — Telephone Encounter (Signed)
FLUTICASONE PROP NASAL SPRAY 50 MG  QTY: 90 DAYS

## 2012-03-18 NOTE — Telephone Encounter (Signed)
rx sent to pharmacy by e-script  

## 2012-03-25 ENCOUNTER — Ambulatory Visit (INDEPENDENT_AMBULATORY_CARE_PROVIDER_SITE_OTHER): Payer: 59 | Admitting: Family Medicine

## 2012-03-25 VITALS — BP 108/74 | HR 74 | Temp 97.9°F | Wt 133.0 lb

## 2012-03-25 DIAGNOSIS — S7010XA Contusion of unspecified thigh, initial encounter: Secondary | ICD-10-CM

## 2012-03-25 DIAGNOSIS — R21 Rash and other nonspecific skin eruption: Secondary | ICD-10-CM

## 2012-03-25 MED ORDER — PREDNISONE 20 MG PO TABS: ORAL_TABLET | ORAL | Status: DC

## 2012-03-25 NOTE — Patient Instructions (Addendum)
We'll notify you of your lab results Start the Prednisone as directed- take w/ food Call with any questions or concerns Hang in there!

## 2012-03-25 NOTE — Progress Notes (Signed)
  Subjective:    Patient ID: [employer], female    DOB: 1981/03/13, 31 y.o.   MRN: [id number]  HPI Rash- first appeared 10 days ago as rash on upper thighs and abdomen.  + itching.  Now w/ extensive bruising of upper thighs in place of rash.  No known injury.  No fevers.  No one at home w/ similar rash.  L arm is not affected.  Some red bumps on R arm.  Otherwise feeling well- denies HA, fatigue, N/V/D, URI sxs, dysuria.   Review of Systems For ROS see HPI     Objective:   Physical Exam  Vitals reviewed. Constitutional: She appears well-developed and well-nourished. No distress.  Cardiovascular: Normal rate, regular rhythm and normal heart sounds.   Pulmonary/Chest: Effort normal and breath sounds normal. No respiratory distress. She has no wheezes. She has no rales.  Abdominal: Soft. Bowel sounds are normal. She exhibits no distension. There is no tenderness. There is no rebound and no guarding.  Musculoskeletal: She exhibits no edema.  Skin: Skin is warm and dry. Rash (scattered maculopapular rash on abdomen and R arm) noted.       Extensive bruising on upper thighs bilaterally in various shades of yellow and brown.  Not tender, no induration.          Assessment & Plan:

## 2012-03-26 ENCOUNTER — Encounter: Payer: Self-pay | Admitting: *Deleted

## 2012-03-26 LAB — CBC WITH DIFFERENTIAL/PLATELET
Basophils Absolute: 0 10*3/uL (ref 0.0–0.1)
Basophils Relative: 0.5 % (ref 0.0–3.0)
Eosinophils Absolute: 0.1 10*3/uL (ref 0.0–0.7)
Eosinophils Relative: 1.8 % (ref 0.0–5.0)
HCT: 37.7 % (ref 36.0–46.0)
Hemoglobin: 12.4 g/dL (ref 12.0–15.0)
Lymphocytes Relative: 29.5 % (ref 12.0–46.0)
Lymphs Abs: 2 10*3/uL (ref 0.7–4.0)
MCHC: 32.8 g/dL (ref 30.0–36.0)
MCV: 93 fl (ref 78.0–100.0)
Monocytes Absolute: 0.5 10*3/uL (ref 0.1–1.0)
Monocytes Relative: 7.4 % (ref 3.0–12.0)
Neutro Abs: 4.2 10*3/uL (ref 1.4–7.7)
Neutrophils Relative %: 60.8 % (ref 43.0–77.0)
Platelets: 183 10*3/uL (ref 150.0–400.0)
RBC: 4.05 Mil/uL (ref 3.87–5.11)
RDW: 13.2 % (ref 11.5–14.6)
WBC: 6.9 10*3/uL (ref 4.5–10.5)

## 2012-03-26 LAB — HEPATIC FUNCTION PANEL
ALT: 14 U/L (ref 0–35)
AST: 24 U/L (ref 0–37)
Albumin: 4.3 g/dL (ref 3.5–5.2)
Alkaline Phosphatase: 47 U/L (ref 39–117)
Bilirubin, Direct: 0 mg/dL (ref 0.0–0.3)
Total Bilirubin: 0.6 mg/dL (ref 0.3–1.2)
Total Protein: 7.2 g/dL (ref 6.0–8.3)

## 2012-03-26 LAB — SEDIMENTATION RATE: Sed Rate: 7 mm/hr (ref 0–22)

## 2012-03-26 LAB — ANA: Anti Nuclear Antibody(ANA): NEGATIVE

## 2012-03-26 NOTE — Assessment & Plan Note (Signed)
New.  See discussion above.

## 2012-03-26 NOTE — Assessment & Plan Note (Signed)
New.  Unclear as to etiology or correlation between rash and bruising but pt adamant that she feels well and that bruising has taken place of red rash.  ? Vasculitis.  Check labs to r/o inflammation, infxn, rheumatologic process, metabolic abnormality.  Start prednisone to improve rash sxs.  Reviewed supportive care and red flags that should prompt return.  Pt expressed understanding and is in agreement w/ plan.

## 2012-04-17 ENCOUNTER — Other Ambulatory Visit (HOSPITAL_COMMUNITY)
Admission: RE | Admit: 2012-04-17 | Discharge: 2012-04-17 | Disposition: A | Discharge status: Home / Self Care | Payer: 59 | Source: Ambulatory Visit | Attending: Family Medicine | Admitting: Family Medicine

## 2012-04-17 ENCOUNTER — Ambulatory Visit (INDEPENDENT_AMBULATORY_CARE_PROVIDER_SITE_OTHER): Payer: 59 | Admitting: Family Medicine

## 2012-04-17 ENCOUNTER — Encounter: Payer: Self-pay | Admitting: Family Medicine

## 2012-04-17 VITALS — BP 115/75 | HR 67 | Temp 98.3°F | Ht 60.0 in | Wt 126.6 lb

## 2012-04-17 DIAGNOSIS — Z01419 Encounter for gynecological examination (general) (routine) without abnormal findings: Secondary | ICD-10-CM

## 2012-04-17 DIAGNOSIS — Z124 Encounter for screening for malignant neoplasm of cervix: Secondary | ICD-10-CM

## 2012-04-17 DIAGNOSIS — E559 Vitamin D deficiency, unspecified: Secondary | ICD-10-CM

## 2012-04-17 DIAGNOSIS — Z0000 Encounter for general adult medical examination without abnormal findings: Secondary | ICD-10-CM

## 2012-04-17 DIAGNOSIS — Z23 Encounter for immunization: Secondary | ICD-10-CM

## 2012-04-17 DIAGNOSIS — E039 Hypothyroidism, unspecified: Secondary | ICD-10-CM

## 2012-04-17 MED ORDER — TETANUS-DIPHTH-ACELL PERTUSSIS 5-2.5-18.5 LF-MCG/0.5 IM SUSP: 0.5000 mL | Freq: Once | INTRAMUSCULAR | Status: DC

## 2012-04-17 NOTE — Assessment & Plan Note (Signed)
Chronic problem.  Check labs.  Replete prn. 

## 2012-04-17 NOTE — Assessment & Plan Note (Signed)
Pt's PE WNL.  Check labs.  Anticipatory guidance provided.  

## 2012-04-17 NOTE — Progress Notes (Signed)
  Subjective:    Patient ID: [employer], female    DOB: 1981/06/04, 31 y.o.   MRN: [id number]  HPI CPE- no concerns.   Review of Systems Patient reports no vision/ hearing changes, adenopathy,fever, weight change,  persistant/recurrent hoarseness , swallowing issues, chest pain, palpitations, edema, persistant/recurrent cough, hemoptysis, dyspnea (rest/exertional/paroxysmal nocturnal), gastrointestinal bleeding (melena, rectal bleeding), abdominal pain, significant heartburn, bowel changes, GU symptoms (dysuria, hematuria, incontinence), Gyn symptoms (abnormal  bleeding, pain),  syncope, focal weakness, memory loss, numbness & tingling, skin/hair/nail changes, abnormal bruising or bleeding, anxiety, or depression.     Objective:   Physical Exam  General Appearance:    Alert, cooperative, no distress, appears stated age  Head:    Normocephalic, without obvious abnormality, atraumatic  Eyes:    PERRL, conjunctiva/corneas clear, EOM's intact, fundi    benign, both eyes  Ears:    Normal TM's and external ear canals, both ears  Nose:   Nares normal, septum midline, mucosa normal, no drainage    or sinus tenderness  Throat:   Lips, mucosa, and tongue normal; teeth and gums normal  Neck:   Supple, symmetrical, trachea midline, no adenopathy;    Thyroid: no enlargement/tenderness/nodules  Back:     Symmetric, no curvature, ROM normal, no CVA tenderness  Lungs:     Clear to auscultation bilaterally, respirations unlabored  Chest Wall:    No tenderness or deformity   Heart:    Regular rate and rhythm, S1 and S2 normal, no murmur, rub   or gallop  Breast Exam:    No tenderness, masses, or nipple abnormality  Abdomen:     Soft, non-tender, bowel sounds active all four quadrants,    no masses, no organomegaly  Genitalia:    External genitalia normal, cervix normal in appearance, no CMT, uterus in normal size and position, adnexa w/out mass or tenderness, mucosa pink and moist, no lesions or discharge  present  Rectal:    Normal external appearance  Extremities:   Extremities normal, atraumatic, no cyanosis or edema  Pulses:   2+ and symmetric all extremities  Skin:   Skin color, texture, turgor normal, no rashes or lesions  Lymph nodes:   Cervical, supraclavicular, and axillary nodes normal  Neurologic:   CNII-XII intact, normal strength, sensation and reflexes    throughout          Assessment & Plan:

## 2012-04-17 NOTE — Patient Instructions (Addendum)
Follow up in 1 year or as needed We'll notify you of your lab results Call with any questions or concerns Hang in there!!!

## 2012-04-17 NOTE — Assessment & Plan Note (Signed)
Pap collected. 

## 2012-04-17 NOTE — Assessment & Plan Note (Signed)
Chronic problem.  Having labs checked regularly w/ GYN for infertility tx.

## 2012-04-18 LAB — LIPID PANEL
Cholesterol: 139 mg/dL (ref 0–200)
HDL: 43.7 mg/dL (ref >39.00)
LDL Cholesterol: 78 mg/dL (ref 0–99)
Total CHOL/HDL Ratio: 3
Triglycerides: 87 mg/dL (ref 0.0–149.0)
VLDL: 17.4 mg/dL (ref 0.0–40.0)

## 2012-04-18 LAB — HEPATIC FUNCTION PANEL
ALT: 14 U/L (ref 0–35)
AST: 19 U/L (ref 0–37)
Albumin: 4.3 g/dL (ref 3.5–5.2)
Alkaline Phosphatase: 43 U/L (ref 39–117)
Bilirubin, Direct: 0.1 mg/dL (ref 0.0–0.3)
Total Bilirubin: 0.7 mg/dL (ref 0.3–1.2)
Total Protein: 7.2 g/dL (ref 6.0–8.3)

## 2012-04-18 LAB — CBC WITH DIFFERENTIAL/PLATELET
Basophils Absolute: 0 10*3/uL (ref 0.0–0.1)
Basophils Relative: 0.2 % (ref 0.0–3.0)
Eosinophils Absolute: 0.1 10*3/uL (ref 0.0–0.7)
Eosinophils Relative: 0.9 % (ref 0.0–5.0)
HCT: 37.6 % (ref 36.0–46.0)
Hemoglobin: 12.1 g/dL (ref 12.0–15.0)
Lymphocytes Relative: 24.2 % (ref 12.0–46.0)
Lymphs Abs: 1.6 10*3/uL (ref 0.7–4.0)
MCHC: 32.3 g/dL (ref 30.0–36.0)
MCV: 92.7 fl (ref 78.0–100.0)
Monocytes Absolute: 0.4 10*3/uL (ref 0.1–1.0)
Monocytes Relative: 6.7 % (ref 3.0–12.0)
Neutro Abs: 4.5 10*3/uL (ref 1.4–7.7)
Neutrophils Relative %: 68 % (ref 43.0–77.0)
Platelets: 192 10*3/uL (ref 150.0–400.0)
RBC: 4.06 Mil/uL (ref 3.87–5.11)
RDW: 13.9 % (ref 11.5–14.6)
WBC: 6.6 10*3/uL (ref 4.5–10.5)

## 2012-04-18 LAB — BASIC METABOLIC PANEL
BUN: 18 mg/dL (ref 6–23)
CO2: 24 mEq/L (ref 19–32)
Calcium: 9.3 mg/dL (ref 8.4–10.5)
Chloride: 106 mEq/L (ref 96–112)
Creatinine, Ser: 1.2 mg/dL (ref 0.4–1.2)
GFR: 57.81 mL/min — ABNORMAL LOW (ref >60.00)
Glucose, Bld: 75 mg/dL (ref 70–99)
Potassium: 4.5 mEq/L (ref 3.5–5.1)
Sodium: 138 mEq/L (ref 135–145)

## 2012-04-21 LAB — VITAMIN D 1,25 DIHYDROXY
Vitamin D 1, 25 (OH)2 Total: 48 pg/mL (ref 18–72)
Vitamin D2 1, 25 (OH)2: <8 pg/mL
Vitamin D3 1, 25 (OH)2: 48 pg/mL

## 2012-04-22 ENCOUNTER — Encounter: Payer: Self-pay | Admitting: Family Medicine

## 2012-04-23 ENCOUNTER — Encounter: Payer: Self-pay | Admitting: Family Medicine

## 2012-07-29 ENCOUNTER — Other Ambulatory Visit: Payer: Self-pay | Admitting: *Deleted

## 2012-07-29 NOTE — Telephone Encounter (Signed)
Pt states that she is now changing over to a mail order and would like to get 3 months supply sent in to pharmacy. Last OV 03-25-12 no history of refill on imitrex .Please advise

## 2012-07-30 MED ORDER — AZELASTINE HCL 0.15 % NA SOLN: 1.0000 | Freq: Two times a day (BID) | NASAL | Status: DC

## 2012-07-30 MED ORDER — SUMATRIPTAN SUCCINATE 50 MG PO TABS: 50.0000 mg | ORAL_TABLET | ORAL | Status: DC | PRN

## 2012-07-30 NOTE — Telephone Encounter (Signed)
Discuss with patient, Rx sent. 

## 2012-07-30 NOTE — Telephone Encounter (Signed)
Ok for 3 months of each.  Imitrex is usually covered for 10 tabs monthly so #30, 3 refills should cover 1 year.

## 2012-09-13 ENCOUNTER — Ambulatory Visit (INDEPENDENT_AMBULATORY_CARE_PROVIDER_SITE_OTHER): Payer: 59 | Admitting: Family Medicine

## 2012-09-13 ENCOUNTER — Encounter: Payer: Self-pay | Admitting: Family Medicine

## 2012-09-13 VITALS — BP 102/70 | HR 72 | Temp 97.8°F | Ht 60.75 in | Wt 127.6 lb

## 2012-09-13 DIAGNOSIS — J329 Chronic sinusitis, unspecified: Secondary | ICD-10-CM | POA: Insufficient documentation

## 2012-09-13 DIAGNOSIS — E039 Hypothyroidism, unspecified: Secondary | ICD-10-CM

## 2012-09-13 LAB — TSH: TSH: 1.6 u[IU]/mL (ref 0.35–5.50)

## 2012-09-13 MED ORDER — LEVOTHYROXINE SODIUM 88 MCG PO TABS: 88.0000 ug | ORAL_TABLET | Freq: Every day | ORAL | Status: DC

## 2012-09-13 MED ORDER — GUAIFENESIN-CODEINE 100-10 MG/5ML PO SYRP: 10.0000 mL | ORAL_SOLUTION | Freq: Three times a day (TID) | ORAL | Status: DC | PRN

## 2012-09-13 MED ORDER — AMOXICILLIN 875 MG PO TABS: 875.0000 mg | ORAL_TABLET | Freq: Two times a day (BID) | ORAL | Status: DC

## 2012-09-13 NOTE — Progress Notes (Signed)
  Subjective:    Patient ID: Teresa Guzman, female    DOB: 12/26/1980, 32 y.o.   MRN: [id number]  HPI ? Sinusitis- sxs started 2 weeks ago w/ nasal congestion.  No fever or body aches.  + facial pressure.  No ear pain.  + dry, hacking cough.  + PND.  + sick contacts.  No N/V/D.  Hypothyroid- chronic problem, dose was decreased by GYN due to fertility treatments.  Pt no longer seeing GYN, has not had labs in 6 months.  + fatigue.  Is coming due for refill.   Review of Systems For ROS see HPI     Objective:   Physical Exam  Constitutional: She appears well-developed and well-nourished. No distress.  HENT:  Head: Normocephalic and atraumatic.  Right Ear: Tympanic membrane normal.  Left Ear: Tympanic membrane normal.  Nose: Mucosal edema and rhinorrhea present. Right sinus exhibits maxillary sinus tenderness. Right sinus exhibits no frontal sinus tenderness. Left sinus exhibits maxillary sinus tenderness. Left sinus exhibits no frontal sinus tenderness.  Mouth/Throat: Uvula is midline and mucous membranes are normal. Posterior oropharyngeal erythema present. No oropharyngeal exudate.  Eyes: Conjunctivae normal and EOM are normal. Pupils are equal, round, and reactive to light.  Neck: Normal range of motion. Neck supple.  Cardiovascular: Normal rate, regular rhythm and normal heart sounds.   Pulmonary/Chest: Effort normal and breath sounds normal. No respiratory distress. She has no wheezes.  Lymphadenopathy:    She has no cervical adenopathy.          Assessment & Plan:

## 2012-09-13 NOTE — Assessment & Plan Note (Signed)
Chronic problem, due for labs.  Adjust meds prn.

## 2012-09-13 NOTE — Addendum Note (Signed)
Addended by: Sheliah Hatch on: 09/13/2012 04:32 PM   Modules accepted: Orders

## 2012-09-13 NOTE — Patient Instructions (Addendum)
This is a maxillary sinus infection Start the Amox twice daily, take w/ food Drink plenty of fluids Cough syrup as needed REST! We'll notify you of your lab results and change the thyroid med if needed Call with any questions or concerns Good Luck!!!

## 2012-09-13 NOTE — Assessment & Plan Note (Signed)
New.  Pt's sxs and PE consistent w/ infxn.  Start abx.  Reviewed supportive care and red flags that should prompt return.  Pt expressed understanding and is in agreement w/ plan.  

## 2012-11-19 ENCOUNTER — Ambulatory Visit: Payer: 59 | Admitting: Family Medicine

## 2012-12-11 ENCOUNTER — Ambulatory Visit (INDEPENDENT_AMBULATORY_CARE_PROVIDER_SITE_OTHER): Payer: 59 | Admitting: Family Medicine

## 2012-12-11 ENCOUNTER — Encounter: Payer: Self-pay | Admitting: Family Medicine

## 2012-12-11 VITALS — BP 110/80 | HR 71 | Temp 97.8°F | Ht 60.78 in | Wt 121.2 lb

## 2012-12-11 DIAGNOSIS — S91109A Unspecified open wound of unspecified toe(s) without damage to nail, initial encounter: Secondary | ICD-10-CM

## 2012-12-11 DIAGNOSIS — M25572 Pain in left ankle and joints of left foot: Secondary | ICD-10-CM

## 2012-12-11 DIAGNOSIS — M25579 Pain in unspecified ankle and joints of unspecified foot: Secondary | ICD-10-CM

## 2012-12-11 DIAGNOSIS — S91209A Unspecified open wound of unspecified toe(s) with damage to nail, initial encounter: Secondary | ICD-10-CM

## 2012-12-11 NOTE — Patient Instructions (Addendum)
Try and wear supportive shoes ICE when painful Ibuprofen as needed We'll call you with your Sports Med appt Sherri Rad in there!

## 2012-12-11 NOTE — Assessment & Plan Note (Signed)
Pt now w/ pain over metatarsal heads dorsally.  Suspect this is due to pt frequently being barefoot and lack of support.  Refer to sports med for possible orthotics.

## 2012-12-11 NOTE — Progress Notes (Signed)
  Subjective:    Patient ID: Teresa Guzman, female    DOB: 1981/04/27, 32 y.o.   MRN: [id number]  HPI Foot pain- has been running recently and developed pain in top of L foot.  L 2nd toenail 'popped off'- not painful, no infxn, no redness, no drainage.  Pain across transverse arch/metatarsal heads.  Reports pain was severe when she initially resumed running but is improving.  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   Review of Systems For ROS see HPI     Objective:   Physical Exam  Vitals reviewed. Constitutional: She appears well-developed and well-nourished. No distress.  Musculoskeletal:  + pain to palpation over L metatarsal heads (dorsal) and pain w/ weight bearing.  No swelling.  Skin: Skin is warm and dry. No erythema.  L 2nd toenail is missing w/out evidence of infxn          Assessment & Plan:

## 2012-12-11 NOTE — Assessment & Plan Note (Signed)
New.  Due to repetitive trauma of running.  No evidence of infxn.  Not painful.  Reviewed that this is frequent occurrence in running.  Pt expressed understanding and is in agreement

## 2012-12-12 ENCOUNTER — Encounter: Payer: Self-pay | Admitting: Family Medicine

## 2012-12-12 ENCOUNTER — Ambulatory Visit (INDEPENDENT_AMBULATORY_CARE_PROVIDER_SITE_OTHER): Payer: 59 | Admitting: Family Medicine

## 2012-12-12 VITALS — BP 128/57 | HR 79 | Ht 60.0 in | Wt 121.0 lb

## 2012-12-12 DIAGNOSIS — M79609 Pain in unspecified limb: Secondary | ICD-10-CM

## 2012-12-12 DIAGNOSIS — M79672 Pain in left foot: Secondary | ICD-10-CM

## 2012-12-12 NOTE — Patient Instructions (Addendum)
You do not have a stress fracture. Activities as tolerated including running. Ice foot 15 minutes at a time after exercise. Aleve 2 tabs twice a day with food x 7 days then as needed. Consider Dr. Jari Sportsman active series insoles OR our insoles with a metatarsal pad if you're having consistent pain in the ball of your foot. Consider physical therapy for extensor tendinopathy which is your primary issue but has been improving. Follow up with me as needed.

## 2012-12-13 ENCOUNTER — Encounter: Payer: Self-pay | Admitting: Family Medicine

## 2012-12-13 DIAGNOSIS — M79672 Pain in left foot: Secondary | ICD-10-CM | POA: Insufficient documentation

## 2012-12-13 NOTE — Assessment & Plan Note (Signed)
consistent with mild extensor tendinopathy and metatarsalgia.  However she states over past week she has been feeling a lot better which may be why exam is relatively benign today. No evidence of stress fracture on exam or by ultrasound. Discussed icing, activities as tolerated, OTC insoles vs green insoles with metatarsal pads.  She declined physical therapy.  If pain worsens advised for her to call us back and consider inserts, physical therapy.  Otherwise f/u prn.

## 2012-12-13 NOTE — Progress Notes (Signed)
  Subjective:    Patient ID: Teresa Guzman, female    DOB: [birth year], 32 y.o.   MRN: [id number]  PCP: Dr. Beverely Low  HPI 32 yo F here for left foot/ankle pain.  Patient denies known injury. States she has restarted running the past 6 weeks. Started to develop dorsal left foot/ankle pain mostly after running. Has been icing and taking ibuprofen. No prior history of stress fracture. Actually has felt better over past week. Some pain near toes as well but this is mild. No swelling or bruising.  Past Medical History  Diagnosis Date  . Endometriosis   . Hypothyroidism     Current Outpatient Prescriptions on File Prior to Visit  Medication Sig Dispense Refill  . levothyroxine (SYNTHROID, LEVOTHROID) 88 MCG tablet Take 1 tablet (88 mcg total) by mouth daily.  90 tablet  3  . SUMAtriptan (IMITREX) 50 MG tablet Take 1 tablet (50 mg total) by mouth as needed. May repeat in 2 hours if sxs persist.disp 1 month supply  30 tablet  3   No current facility-administered medications on file prior to visit.    Past Surgical History  Procedure Laterality Date  . Arm surgery Left 1987  . Cyst removal hand      No Known Allergies  History   Social History  . Marital Status: Married    Spouse Name: N/A    Number of Children: N/A  . Years of Education: N/A   Occupational History  . Not on file.   Social History Main Topics  . Smoking status: Never Smoker   . Smokeless tobacco: Not on file  . Alcohol Use: Yes     Comment: occ.  . Drug Use: No  . Sexually Active: Not on file   Other Topics Concern  . Not on file   Social History Narrative  . No narrative on file    Family History  Problem Relation Age of Onset  . Cancer Brother 19    colon cancer  . Hyperlipidemia Mother   . Hypertension Father   . Hyperlipidemia Father   . Heart attack Father   . Diabetes Father     BP 128/57  Pulse 79  Ht 5' (1.524 m)  Wt 121 lb (54.885 kg)  BMI 23.63 kg/m2  LMP  11/20/2012  Review of Systems See HPI above.    Objective:   Physical Exam Gen: NAD  L foot/ankle: No gross deformity, swelling, ecchymoses No callus formation under 2nd-4th metatarsals. FROM ankle with mild pain on dorsiflexion.  5/5 strength all directions. TTP mildly dorsal foot over extensor digitorum tendons at ankle.  No bony tenderness of ankle.  Minimal tenderness of 3rd, 4th metatarsals. Negative ant drawer and talar tilt.   Negative syndesmotic compression. Thompsons test negative. Metatarsal squeeze negative. NV intact distally. Negative hop test.  MSK u/s:  No evidence cortical irregularity, edema overlying cortex, or increased neovascularity of 2nd-4th metatarsals to suggest stress fracture.    Assessment & Plan:  1. Left foot pain - consistent with mild extensor tendinopathy and metatarsalgia.  However she states over past week she has been feeling a lot better which may be why exam is relatively benign today. No evidence of stress fracture on exam or by ultrasound. Discussed icing, activities as tolerated, OTC insoles vs green insoles with metatarsal pads.  She declined physical therapy.  If pain worsens advised for her to call us back and consider inserts, physical therapy.  Otherwise f/u prn.

## 2013-02-06 ENCOUNTER — Other Ambulatory Visit: Payer: Self-pay | Admitting: Family Medicine

## 2013-02-06 NOTE — Telephone Encounter (Signed)
Rx sent to the pharmacy by e-script.//AB/CMA 

## 2013-05-15 ENCOUNTER — Encounter: Payer: Self-pay | Admitting: Family Medicine

## 2013-05-15 ENCOUNTER — Ambulatory Visit (INDEPENDENT_AMBULATORY_CARE_PROVIDER_SITE_OTHER): Payer: 59 | Admitting: Family Medicine

## 2013-05-15 VITALS — BP 130/70 | HR 83 | Temp 98.2°F | Resp 76 | Wt 118.0 lb

## 2013-05-15 DIAGNOSIS — J45991 Cough variant asthma: Secondary | ICD-10-CM | POA: Insufficient documentation

## 2013-05-15 DIAGNOSIS — J309 Allergic rhinitis, unspecified: Secondary | ICD-10-CM

## 2013-05-15 DIAGNOSIS — Z23 Encounter for immunization: Secondary | ICD-10-CM

## 2013-05-15 MED ORDER — ALBUTEROL SULFATE (2.5 MG/3ML) 0.083% IN NEBU
2.5000 mg | INHALATION_SOLUTION | Freq: Once | RESPIRATORY_TRACT | Status: AC
  Administered 2013-05-15: 2.5 mg via RESPIRATORY_TRACT

## 2013-05-15 MED ORDER — AZELASTINE HCL 0.15 % NA SOLN: 2.0000 | Freq: Two times a day (BID) | NASAL | Status: DC

## 2013-05-15 MED ORDER — MONTELUKAST SODIUM 10 MG PO TABS: 10.0000 mg | ORAL_TABLET | Freq: Every day | ORAL | Status: DC

## 2013-05-15 MED ORDER — SUMATRIPTAN SUCCINATE 50 MG PO TABS: 50.0000 mg | ORAL_TABLET | ORAL | Status: DC | PRN

## 2013-05-15 MED ORDER — ALBUTEROL SULFATE HFA 108 (90 BASE) MCG/ACT IN AERS: 2.0000 | INHALATION_SPRAY | RESPIRATORY_TRACT | Status: DC | PRN

## 2013-05-15 NOTE — Patient Instructions (Signed)
Use the albuterol as needed for cough Start the Singulair in addition to the nasal sprays and daily allergy med Drink plenty of fluids Call with any questions or concerns Hang in there!

## 2013-05-15 NOTE — Assessment & Plan Note (Signed)
New.  Pt's sxs and improvement s/p neb tx consistent w/ cough variant asthma.  Start singulair for sxs improvement and add albuterol prn.

## 2013-05-15 NOTE — Progress Notes (Signed)
  Subjective:    Patient ID: Teresa Guzman, female    DOB: 1981/02/15, 32 y.o.   MRN: [id number]  HPI Cough- worse w/ exercise.  sxs started 1 month ago.  Taking daily decongestant, using nasal sprays regularly.  Has constant sensation of needing to clear throat.  Has some associated SOB w/ cough.  Hx of exercise induced asthma.   Review of Systems For ROS see HPI     Objective:   Physical Exam  Vitals reviewed. Constitutional: She appears well-developed and well-nourished. No distress.  HENT:  Head: Normocephalic and atraumatic.  Right Ear: Tympanic membrane normal.  Left Ear: Tympanic membrane normal.  Nose: Mucosal edema and rhinorrhea present. Right sinus exhibits no maxillary sinus tenderness and no frontal sinus tenderness. Left sinus exhibits no maxillary sinus tenderness and no frontal sinus tenderness.  Mouth/Throat: Mucous membranes are normal. Posterior oropharyngeal erythema (w/ PND) present.  Eyes: Conjunctivae and EOM are normal. Pupils are equal, round, and reactive to light.  Neck: Normal range of motion. Neck supple.  Cardiovascular: Normal rate, regular rhythm and normal heart sounds.   Pulmonary/Chest: Effort normal and breath sounds normal. No respiratory distress. She has no wheezes. She has no rales.  Lymphadenopathy:    She has no cervical adenopathy.          Assessment & Plan:

## 2013-05-15 NOTE — Assessment & Plan Note (Signed)
Deteriorated despite nasal sprays and daily antihistamine.  Start singulair.  Will follow.

## 2013-07-07 ENCOUNTER — Encounter: Payer: Self-pay | Admitting: Family Medicine

## 2013-07-07 MED ORDER — LEVOTHYROXINE SODIUM 88 MCG PO TABS: 88.0000 ug | ORAL_TABLET | Freq: Every day | ORAL | Status: DC

## 2013-07-18 ENCOUNTER — Encounter: Payer: Self-pay | Admitting: Family Medicine

## 2013-07-18 ENCOUNTER — Ambulatory Visit (INDEPENDENT_AMBULATORY_CARE_PROVIDER_SITE_OTHER): Payer: 59 | Admitting: Family Medicine

## 2013-07-18 VITALS — BP 120/80 | HR 78 | Temp 98.1°F | Resp 16 | Ht 60.0 in | Wt 121.1 lb

## 2013-07-18 DIAGNOSIS — Z01419 Encounter for gynecological examination (general) (routine) without abnormal findings: Secondary | ICD-10-CM

## 2013-07-18 MED ORDER — MONTELUKAST SODIUM 10 MG PO TABS: 10.0000 mg | ORAL_TABLET | Freq: Every day | ORAL | Status: DC

## 2013-07-18 NOTE — Patient Instructions (Signed)
Follow up in 1 year or as needed We'll notify you of your lab results and make any changes if needed Keep up the good work!  You look great!! Call with any questions or concerns Happy Holidays!!! 

## 2013-07-18 NOTE — Progress Notes (Signed)
   Subjective:    Patient ID: Teresa Guzman, female    DOB: October 02, 1980, 32 y.o.   MRN: [id number]  HPI CPE- UTD on pap, hx of endometriomas involving both ovaries- s/p surgery.  Had [place] as recently as 5/14 showing no recurrence.   Review of Systems Patient reports no vision/ hearing changes, adenopathy,fever, weight change,  persistant/recurrent hoarseness , swallowing issues, chest pain, palpitations, edema, persistant/recurrent cough, hemoptysis, dyspnea (rest/exertional/paroxysmal nocturnal), gastrointestinal bleeding (melena, rectal bleeding), abdominal pain, significant heartburn, bowel changes, GU symptoms (dysuria, hematuria, incontinence), Gyn symptoms (abnormal  bleeding, pain),  syncope, focal weakness, memory loss, numbness & tingling, skin/hair/nail changes, abnormal bruising or bleeding, anxiety, or depression.     Objective:   Physical Exam  General Appearance:    Alert, cooperative, no distress, appears stated age  Head:    Normocephalic, without obvious abnormality, atraumatic  Eyes:    PERRL, conjunctiva/corneas clear, EOM's intact, fundi    benign, both eyes  Ears:    Normal TM's and external ear canals, both ears  Nose:   Nares normal, septum midline, mucosa normal, no drainage    or sinus tenderness  Throat:   Lips, mucosa, and tongue normal; teeth and gums normal  Neck:   Supple, symmetrical, trachea midline, no adenopathy;    Thyroid: no enlargement/tenderness/nodules  Back:     Symmetric, no curvature, ROM normal, no CVA tenderness  Lungs:     Clear to auscultation bilaterally, respirations unlabored  Chest Wall:    No tenderness or deformity   Heart:    Regular rate and rhythm, S1 and S2 normal, no murmur, rub   or gallop  Breast Exam:    No tenderness, masses, or nipple abnormality  Abdomen:     Soft, non-tender, bowel sounds active all four quadrants,    no masses, no organomegaly  Genitalia:    deferred  Rectal:    Extremities:   Extremities normal,  atraumatic, no cyanosis or edema  Pulses:   2+ and symmetric all extremities  Skin:   Skin color, texture, turgor normal, no rashes or lesions  Lymph nodes:   Cervical, supraclavicular, and axillary nodes normal  Neurologic:   CNII-XII intact, normal strength, sensation and reflexes    throughout          Assessment & Plan:

## 2013-07-18 NOTE — Addendum Note (Signed)
Addended by: Silvio Pate D on: 07/18/2013 10:48 AM   Modules accepted: Orders

## 2013-07-18 NOTE — Assessment & Plan Note (Signed)
Pt's PE WNL.  UTD on pap- exam deferred.  Check labs.  Anticipatory guidance provided.

## 2013-07-18 NOTE — Addendum Note (Signed)
Addended by: Silvio Pate D on: 07/18/2013 10:50 AM   Modules accepted: Orders

## 2013-07-18 NOTE — Progress Notes (Signed)
Pre visit review using our clinic review tool, if applicable. No additional management support is needed unless otherwise documented below in the visit note. 

## 2013-07-20 ENCOUNTER — Encounter: Payer: Self-pay | Admitting: Family Medicine

## 2013-07-21 ENCOUNTER — Other Ambulatory Visit: Payer: Self-pay | Admitting: General Practice

## 2013-07-21 MED ORDER — AZELASTINE HCL 0.15 % NA SOLN: 2.0000 | Freq: Two times a day (BID) | NASAL | Status: DC

## 2013-07-21 MED ORDER — LEVOTHYROXINE SODIUM 88 MCG PO TABS: 88.0000 ug | ORAL_TABLET | Freq: Every day | ORAL | Status: DC

## 2013-07-21 MED ORDER — MONTELUKAST SODIUM 10 MG PO TABS: 10.0000 mg | ORAL_TABLET | Freq: Every day | ORAL | Status: DC

## 2013-07-24 ENCOUNTER — Telehealth: Payer: Self-pay | Admitting: General Practice

## 2013-07-24 NOTE — Telephone Encounter (Signed)
Lab results from [facility] received on 07/23/13. Per Beverely Low all labs were normal with the exception of a mildly elevated Vitamin D (89.4)   Letter mailed to inform pt and labs sent to scanning on 07/24/13

## 2013-08-15 ENCOUNTER — Other Ambulatory Visit: Payer: Self-pay | Admitting: Family Medicine

## 2013-08-15 NOTE — Telephone Encounter (Signed)
Med filled.  

## 2013-08-26 ENCOUNTER — Ambulatory Visit (INDEPENDENT_AMBULATORY_CARE_PROVIDER_SITE_OTHER): Payer: 59 | Admitting: Family Medicine

## 2013-08-26 ENCOUNTER — Encounter: Payer: Self-pay | Admitting: Family Medicine

## 2013-08-26 VITALS — BP 120/86 | HR 80 | Temp 98.6°F | Resp 16 | Wt 122.0 lb

## 2013-08-26 DIAGNOSIS — J329 Chronic sinusitis, unspecified: Secondary | ICD-10-CM

## 2013-08-26 MED ORDER — PROMETHAZINE-DM 6.25-15 MG/5ML PO SYRP: 5.0000 mL | ORAL_SOLUTION | Freq: Four times a day (QID) | ORAL | Status: DC | PRN

## 2013-08-26 MED ORDER — AMOXICILLIN 875 MG PO TABS: 875.0000 mg | ORAL_TABLET | Freq: Two times a day (BID) | ORAL | Status: DC

## 2013-08-26 NOTE — Assessment & Plan Note (Signed)
Pt's sxs and PE consistent w/ infxn.  Start abx.  Cough meds prn.  Reviewed supportive care and red flags that should prompt return.  Pt expressed understanding and is in agreement w/ plan.  

## 2013-08-26 NOTE — Progress Notes (Signed)
Pre visit review using our clinic review tool, if applicable. No additional management support is needed unless otherwise documented below in the visit note. 

## 2013-08-26 NOTE — Patient Instructions (Signed)
Follow up as needed Start the Amoxicillin- twice daily w/ food Drink plenty of fluids Use the cough syrup at night- will cause drowsiness Mucinex DM for daytime cough Call with any questions or concerns Hang in there!!

## 2013-08-26 NOTE — Progress Notes (Signed)
   Subjective:    Patient ID: Teresa Guzman, female    DOB: 04/05/1981, 33 y.o.   MRN: [id number]  HPI URI- sxs started Friday w/ laryngitis, sore throat, cough, and fever.  Tm 99.8.  + HA, sinus pain.  + nasal congestion, PND.  Increased chest tightness, using inhaler more frequently.  Cough started as dry but is now productive.  + sick contacts.  Hx of asthma.   Review of Systems For ROS see HPI     Objective:   Physical Exam  Vitals reviewed. Constitutional: She appears well-developed and well-nourished. No distress.  HENT:  Head: Normocephalic and atraumatic.  Right Ear: Tympanic membrane normal.  Left Ear: Tympanic membrane normal.  Nose: Mucosal edema and rhinorrhea present. Right sinus exhibits maxillary sinus tenderness and frontal sinus tenderness. Left sinus exhibits no maxillary sinus tenderness and no frontal sinus tenderness.  Mouth/Throat: Uvula is midline and mucous membranes are normal. Posterior oropharyngeal erythema present. No oropharyngeal exudate.  Eyes: Conjunctivae and EOM are normal. Pupils are equal, round, and reactive to light.  Neck: Normal range of motion. Neck supple.  Cardiovascular: Normal rate, regular rhythm and normal heart sounds.   Pulmonary/Chest: Effort normal and breath sounds normal. No respiratory distress. She has no wheezes.  Lymphadenopathy:    She has no cervical adenopathy.          Assessment & Plan:

## 2013-09-25 ENCOUNTER — Encounter: Payer: Self-pay | Admitting: Family Medicine

## 2013-10-17 ENCOUNTER — Other Ambulatory Visit: Payer: Self-pay | Admitting: General Practice

## 2013-10-17 MED ORDER — FLUTICASONE PROPIONATE 50 MCG/ACT NA SUSP: NASAL | Status: DC

## 2013-11-04 ENCOUNTER — Encounter: Payer: Self-pay | Admitting: Family Medicine

## 2013-11-10 ENCOUNTER — Telehealth: Payer: Self-pay | Admitting: *Deleted

## 2013-11-10 NOTE — Telephone Encounter (Signed)
Patient dropped of form for medical evaluation for [place] [employer] for foster care/adoption. Billing sheet attached, form filled out as much as possible and placed in blue folder for Dr. Beverely Lowabori. JG//CMA

## 2013-11-12 ENCOUNTER — Encounter: Payer: Self-pay | Admitting: General Practice

## 2013-11-13 NOTE — Telephone Encounter (Signed)
Called and left message informing patient that paperwork is ready for pick up at our front desk. JG//CMA 

## 2013-12-12 ENCOUNTER — Other Ambulatory Visit: Payer: Self-pay | Admitting: Family Medicine

## 2013-12-12 NOTE — Telephone Encounter (Signed)
Med filled.  

## 2014-01-26 ENCOUNTER — Ambulatory Visit (INDEPENDENT_AMBULATORY_CARE_PROVIDER_SITE_OTHER): Payer: 59 | Admitting: Family Medicine

## 2014-01-26 ENCOUNTER — Encounter: Payer: Self-pay | Admitting: Family Medicine

## 2014-01-26 VITALS — BP 120/78 | HR 97 | Temp 98.1°F | Resp 16 | Wt 124.1 lb

## 2014-01-26 DIAGNOSIS — J0100 Acute maxillary sinusitis, unspecified: Secondary | ICD-10-CM

## 2014-01-26 MED ORDER — PROMETHAZINE-DM 6.25-15 MG/5ML PO SYRP: 5.0000 mL | ORAL_SOLUTION | Freq: Four times a day (QID) | ORAL | Status: DC | PRN

## 2014-01-26 MED ORDER — AMOXICILLIN 875 MG PO TABS: 875.0000 mg | ORAL_TABLET | Freq: Two times a day (BID) | ORAL | Status: DC

## 2014-01-26 NOTE — Patient Instructions (Signed)
Follow up as needed Drink plenty of fluids Start the Amoxicillin twice daily- take w/ food Ibuprofen as needed for sore throat/fever REST while you're able! Use the cough syrup as needed- will cause drowsiness Mucinex DM for daytime cough Call with any questions or concerns CONGRATS on the babies!!

## 2014-01-26 NOTE — Progress Notes (Signed)
Pre visit review using our clinic review tool, if applicable. No additional management support is needed unless otherwise documented below in the visit note. 

## 2014-01-26 NOTE — Assessment & Plan Note (Signed)
Pt's sxs and PE consistent w/ infxn.  Start abx.  Cough meds prn.  Reviewed supportive care and red flags that should prompt return.  Pt expressed understanding and is in agreement w/ plan.  

## 2014-01-26 NOTE — Progress Notes (Signed)
   Subjective:    Patient ID: Teresa Guzman, female    DOB: 04/21/1981, 33 y.o.   MRN: [id number]  Cough Associated symptoms include ear pain.  Otalgia  Associated symptoms include coughing.   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  All 3 kids arrived sick.  sxs started Friday w/ cough, hoarseness, sore throat.  Now w/ L ear pain.  Tm 99.8.  No N/V/D.  + sinus pressure.   Review of Systems  HENT: Positive for ear pain.   Respiratory: Positive for cough.    For ROS see HPI     Objective:   Physical Exam  Constitutional: She appears well-developed and well-nourished. No distress.  HENT:  Head: Normocephalic and atraumatic.  Right Ear: Tympanic membrane normal.  Left Ear: Tympanic membrane normal.  Nose: Mucosal edema and rhinorrhea present. Right sinus exhibits maxillary sinus tenderness. Right sinus exhibits no frontal sinus tenderness. Left sinus exhibits maxillary sinus tenderness. Left sinus exhibits no frontal sinus tenderness.  Mouth/Throat: Uvula is midline and mucous membranes are normal. Posterior oropharyngeal erythema present. No oropharyngeal exudate.  Eyes: Conjunctivae and EOM are normal. Pupils are equal, round, and reactive to light.  Neck: Normal range of motion. Neck supple.  Cardiovascular: Normal rate, regular rhythm and normal heart sounds.   Pulmonary/Chest: Effort normal and breath sounds normal. No respiratory distress. She has no wheezes.  Lymphadenopathy:    She has no cervical adenopathy.          Assessment & Plan:

## 2014-01-28 ENCOUNTER — Encounter: Payer: Self-pay | Admitting: Family Medicine

## 2014-01-28 ENCOUNTER — Other Ambulatory Visit: Payer: Self-pay | Admitting: Family Medicine

## 2014-01-28 MED ORDER — OSELTAMIVIR PHOSPHATE 75 MG PO CAPS: 75.0000 mg | ORAL_CAPSULE | Freq: Two times a day (BID) | ORAL | Status: DC

## 2014-02-13 ENCOUNTER — Other Ambulatory Visit: Payer: Self-pay | Admitting: Family Medicine

## 2014-02-13 NOTE — Telephone Encounter (Signed)
Med filled.  

## 2014-03-26 ENCOUNTER — Encounter: Payer: Self-pay | Admitting: Family Medicine

## 2014-05-05 ENCOUNTER — Telehealth: Payer: Self-pay | Admitting: Family Medicine

## 2014-05-05 MED ORDER — MONTELUKAST SODIUM 10 MG PO TABS: ORAL_TABLET | ORAL | Status: DC

## 2014-05-05 NOTE — Telephone Encounter (Signed)
Caller name:connie Relation to ZO:XWRUpt:none Call back number:[phone] Pharmacy:[facility]  Reason for call: pt is needing new rx for montelukast (SINGULAIR) 10 MG tablet 90 day supply.

## 2014-05-05 NOTE — Telephone Encounter (Signed)
Med filled.  

## 2014-05-06 ENCOUNTER — Ambulatory Visit (INDEPENDENT_AMBULATORY_CARE_PROVIDER_SITE_OTHER): Payer: 59 | Admitting: Family Medicine

## 2014-05-06 ENCOUNTER — Encounter: Payer: Self-pay | Admitting: Family Medicine

## 2014-05-06 VITALS — BP 120/80 | HR 90 | Temp 98.0°F | Resp 16 | Wt 126.0 lb

## 2014-05-06 DIAGNOSIS — IMO0002 Reserved for concepts with insufficient information to code with codable children: Secondary | ICD-10-CM

## 2014-05-06 DIAGNOSIS — L03019 Cellulitis of unspecified finger: Secondary | ICD-10-CM

## 2014-05-06 DIAGNOSIS — IMO0001 Reserved for inherently not codable concepts without codable children: Secondary | ICD-10-CM | POA: Insufficient documentation

## 2014-05-06 DIAGNOSIS — L03011 Cellulitis of right finger: Secondary | ICD-10-CM

## 2014-05-06 MED ORDER — DOXYCYCLINE HYCLATE 100 MG PO TABS: 100.0000 mg | ORAL_TABLET | Freq: Two times a day (BID) | ORAL | Status: DC

## 2014-05-06 NOTE — Telephone Encounter (Signed)
Error/gd °

## 2014-05-06 NOTE — Patient Instructions (Signed)
Follow up early next week if no better STOP the Clinda START the Doxy- take w/ food Peroxide soaks twice daily, pat dry, apply antibiotic ointment, cover Call with any questions or concerns Hang in there!!!

## 2014-05-06 NOTE — Progress Notes (Signed)
Pre visit review using our clinic review tool, if applicable. No additional management support is needed unless otherwise documented below in the visit note. 

## 2014-05-06 NOTE — Assessment & Plan Note (Signed)
New to provider, deteriorated per pt report.  Pt tolerated lancing procedure done in office today w/ some relief of pressure.  Switch abx to Doxy.  Continue peroxide soaks.  Reviewed supportive care and red flags that should prompt return.  Pt expressed understanding and is in agreement w/ plan.

## 2014-05-06 NOTE — Progress Notes (Signed)
   Subjective:    Patient ID: Teresa GrinderDawn T Murley, female    DOB: 03/23/1981, 33 y.o.   MRN: [id number]  HPI Finger pain- R middle finger.  Started as a hangnail 10 days ago.  Developed redness, swelling, pain.  Soaking in peroxide and using Neosporin.  Saturday finger was very sore and swollen.  Sunday had streaking red line, went to UC where they attempted to drain finger- minimal drainage.  Started pt on Clindamycin 300mg  TID.  Finger now looks worse and more painful.   Review of Systems For ROS see HPI     Objective:   Physical Exam  Vitals reviewed. Constitutional: She appears well-developed and well-nourished. No distress.  Musculoskeletal: She exhibits tenderness. She exhibits no edema.  Skin: There is erythema (over R distal middle finger, no streaking redness w/ some purulence noted surrounding nail bed.  granulation tissue noted at nail edge at site of previous UC I&D).  Area prepped w/ rubbing ETOH, cold spray applied, and lanced w/ 23 gauge needle to relieve pressure and send wound culture          Assessment & Plan:

## 2014-05-09 LAB — WOUND CULTURE
Gram Stain: NONE SEEN
Gram Stain: NONE SEEN
Gram Stain: NONE SEEN
Organism ID, Bacteria: NO GROWTH

## 2014-05-20 ENCOUNTER — Other Ambulatory Visit: Payer: Self-pay

## 2014-05-20 MED ORDER — LEVOTHYROXINE SODIUM 88 MCG PO TABS: ORAL_TABLET | ORAL | Status: DC

## 2014-06-01 ENCOUNTER — Encounter: Payer: Self-pay | Admitting: Family Medicine

## 2014-06-05 ENCOUNTER — Other Ambulatory Visit: Payer: Self-pay | Admitting: Family Medicine

## 2014-06-05 NOTE — Telephone Encounter (Signed)
Med filled.  

## 2014-07-20 ENCOUNTER — Ambulatory Visit (INDEPENDENT_AMBULATORY_CARE_PROVIDER_SITE_OTHER): Payer: 59 | Admitting: Family Medicine

## 2014-07-20 ENCOUNTER — Encounter: Payer: Self-pay | Admitting: Family Medicine

## 2014-07-20 VITALS — BP 120/78 | HR 97 | Temp 98.2°F | Resp 16 | Ht 60.0 in | Wt 126.4 lb

## 2014-07-20 DIAGNOSIS — Z0000 Encounter for general adult medical examination without abnormal findings: Secondary | ICD-10-CM

## 2014-07-20 NOTE — Patient Instructions (Signed)
Follow up in 1 year or as needed We'll notify you of your lab results and make any changes if needed Keep up the good work!  You look great!! Call with any questions or concerns Happy Holidays!!! 

## 2014-07-20 NOTE — Progress Notes (Signed)
   Subjective:    Patient ID: Teresa Guzman, female    DOB: 05/17/1981, 33 y.o.   MRN: [id number]  HPI CPE- UTD on GYN (Dr Lloyd HugerNeil)   Review of Systems Patient reports no vision/ hearing changes, adenopathy,fever, weight change,  persistant/recurrent hoarseness , swallowing issues, chest pain, palpitations, edema, persistant/recurrent cough, hemoptysis, dyspnea (rest/exertional/paroxysmal nocturnal), gastrointestinal bleeding (melena, rectal bleeding), abdominal pain, significant heartburn, bowel changes, GU symptoms (dysuria, hematuria, incontinence), Gyn symptoms (abnormal  bleeding, pain),  syncope, focal weakness, memory loss, numbness & tingling, skin/hair/nail changes, abnormal bruising or bleeding, anxiety, or depression.     Objective:   Physical Exam General Appearance:    Alert, cooperative, no distress, appears stated age  Head:    Normocephalic, without obvious abnormality, atraumatic  Eyes:    PERRL, conjunctiva/corneas clear, EOM's intact, fundi    benign, both eyes  Ears:    Normal TM's and external ear canals, both ears  Nose:   Nares normal, septum midline, mucosa normal, no drainage    or sinus tenderness  Throat:   Lips, mucosa, and tongue normal; teeth and gums normal  Neck:   Supple, symmetrical, trachea midline, no adenopathy;    Thyroid: no enlargement/tenderness/nodules  Back:     Symmetric, no curvature, ROM normal, no CVA tenderness  Lungs:     Clear to auscultation bilaterally, respirations unlabored  Chest Wall:    No tenderness or deformity   Heart:    Regular rate and rhythm, S1 and S2 normal, no murmur, rub   or gallop  Breast Exam:    Deferred to GYN  Abdomen:     Soft, non-tender, bowel sounds active all four quadrants,    no masses, no organomegaly  Genitalia:    Deferred to GYN  Rectal:    Extremities:   Extremities normal, atraumatic, no cyanosis or edema  Pulses:   2+ and symmetric all extremities  Skin:   Skin color, texture, turgor normal, no rashes  or lesions  Lymph nodes:   Cervical, supraclavicular, and axillary nodes normal  Neurologic:   CNII-XII intact, normal strength, sensation and reflexes    throughout          Assessment & Plan:

## 2014-07-20 NOTE — Progress Notes (Signed)
Pre visit review using our clinic review tool, if applicable. No additional management support is needed unless otherwise documented below in the visit note. 

## 2014-07-21 LAB — BASIC METABOLIC PANEL
BUN: 13 mg/dL (ref 6–23)
CO2: 23 mEq/L (ref 19–32)
Calcium: 9 mg/dL (ref 8.4–10.5)
Chloride: 105 mEq/L (ref 96–112)
Creatinine, Ser: 0.6 mg/dL (ref 0.4–1.2)
GFR: 118.56 mL/min (ref >60.00)
Glucose, Bld: 71 mg/dL (ref 70–99)
Potassium: 4 mEq/L (ref 3.5–5.1)
Sodium: 135 mEq/L (ref 135–145)

## 2014-07-21 LAB — LIPID PANEL
Cholesterol: 168 mg/dL (ref 0–200)
HDL: 49.1 mg/dL (ref >39.00)
LDL Cholesterol: 85 mg/dL (ref 0–99)
NonHDL: 118.9
Total CHOL/HDL Ratio: 3
Triglycerides: 171 mg/dL — ABNORMAL HIGH (ref 0.0–149.0)
VLDL: 34.2 mg/dL (ref 0.0–40.0)

## 2014-07-21 LAB — CBC WITH DIFFERENTIAL/PLATELET
Basophils Absolute: 0 10*3/uL (ref 0.0–0.1)
Basophils Relative: 0.5 % (ref 0.0–3.0)
Eosinophils Absolute: 0.1 10*3/uL (ref 0.0–0.7)
Eosinophils Relative: 2.2 % (ref 0.0–5.0)
HCT: 32.6 % — ABNORMAL LOW (ref 36.0–46.0)
Hemoglobin: 10.2 g/dL — ABNORMAL LOW (ref 12.0–15.0)
Lymphocytes Relative: 33.9 % (ref 12.0–46.0)
Lymphs Abs: 2.1 10*3/uL (ref 0.7–4.0)
MCHC: 31.4 g/dL (ref 30.0–36.0)
MCV: 78.8 fl (ref 78.0–100.0)
Monocytes Absolute: 0.5 10*3/uL (ref 0.1–1.0)
Monocytes Relative: 7.3 % (ref 3.0–12.0)
Neutro Abs: 3.5 10*3/uL (ref 1.4–7.7)
Neutrophils Relative %: 56.1 % (ref 43.0–77.0)
Platelets: 314 10*3/uL (ref 150.0–400.0)
RBC: 4.13 Mil/uL (ref 3.87–5.11)
RDW: 17.9 % — ABNORMAL HIGH (ref 11.5–15.5)
WBC: 6.2 10*3/uL (ref 4.0–10.5)

## 2014-07-21 LAB — HEPATIC FUNCTION PANEL
ALT: 11 U/L (ref 0–35)
AST: 20 U/L (ref 0–37)
Albumin: 4.3 g/dL (ref 3.5–5.2)
Alkaline Phosphatase: 50 U/L (ref 39–117)
Bilirubin, Direct: 0 mg/dL (ref 0.0–0.3)
Total Bilirubin: 0.3 mg/dL (ref 0.2–1.2)
Total Protein: 7.4 g/dL (ref 6.0–8.3)

## 2014-07-21 LAB — VITAMIN D 25 HYDROXY (VIT D DEFICIENCY, FRACTURES): VITD: 21.16 ng/mL — ABNORMAL LOW (ref 30.00–100.00)

## 2014-07-21 LAB — TSH: TSH: 0.77 u[IU]/mL (ref 0.35–4.50)

## 2014-07-21 NOTE — Assessment & Plan Note (Signed)
Pt's PE WNL.  UTD on GYN.  Check labs.  Anticipatory guidance provided.  

## 2014-07-22 ENCOUNTER — Other Ambulatory Visit: Payer: Self-pay | Admitting: General Practice

## 2014-07-22 MED ORDER — VITAMIN D (ERGOCALCIFEROL) 1.25 MG (50000 UNIT) PO CAPS: 50000.0000 [IU] | ORAL_CAPSULE | ORAL | Status: DC

## 2014-08-13 ENCOUNTER — Encounter: Payer: Self-pay | Admitting: Gastroenterology

## 2014-09-29 ENCOUNTER — Ambulatory Visit (INDEPENDENT_AMBULATORY_CARE_PROVIDER_SITE_OTHER): Payer: 59 | Admitting: Physician Assistant

## 2014-09-29 ENCOUNTER — Encounter: Payer: Self-pay | Admitting: Physician Assistant

## 2014-09-29 VITALS — BP 108/60 | HR 99 | Temp 99.0°F | Resp 16 | Ht 60.0 in | Wt 127.4 lb

## 2014-09-29 DIAGNOSIS — R5383 Other fatigue: Secondary | ICD-10-CM

## 2014-09-29 DIAGNOSIS — B9689 Other specified bacterial agents as the cause of diseases classified elsewhere: Secondary | ICD-10-CM

## 2014-09-29 DIAGNOSIS — J101 Influenza due to other identified influenza virus with other respiratory manifestations: Secondary | ICD-10-CM

## 2014-09-29 DIAGNOSIS — R509 Fever, unspecified: Secondary | ICD-10-CM

## 2014-09-29 DIAGNOSIS — J019 Acute sinusitis, unspecified: Secondary | ICD-10-CM | POA: Insufficient documentation

## 2014-09-29 LAB — POCT INFLUENZA A/B
Influenza A, POC: POSITIVE
Influenza B, POC: NEGATIVE

## 2014-09-29 MED ORDER — AMOXICILLIN-POT CLAVULANATE 875-125 MG PO TABS: 1.0000 | ORAL_TABLET | Freq: Two times a day (BID) | ORAL | Status: DC

## 2014-09-29 NOTE — Assessment & Plan Note (Signed)
Rx Augmentin. Increase fluid intake. Rest. Saline nasal spray. Rx Flonase. Continue other medications as directed. Use Mucinex for congestion. Place humidifier in bedroom. Return precautions discussed with patient.

## 2014-09-29 NOTE — Assessment & Plan Note (Signed)
Unfortunately patient is outside of the timeframe where Tamiflu will be beneficial. Supportive measures discussed with patient. Rest and hydration will be daily. Patient also being treated for superimposed acute bacterial sinusitis. Return precautions discussed with patient.

## 2014-09-29 NOTE — Progress Notes (Signed)
Patient presents to clinic today c/o 1 week of cough, coryza and fatigue.  Now with sinus pressure, sinus pain and tooth pain.  Denies ear pain, chest pain or SOB.  Endorses fever at onset of symptoms that has resolved.  Endorses having her flu shot this year.  Past Medical History  Diagnosis Date  . Endometriosis   . Hypothyroidism     Current Outpatient Prescriptions on File Prior to Visit  Medication Sig Dispense Refill  . albuterol (PROAIR HFA) 108 (90 BASE) MCG/ACT inhaler Inhale 2 puffs into the lungs every 4 (four) hours as needed for wheezing. 1 Inhaler 6  . Azelastine HCl 0.15 % SOLN use 2 sprays in each  nostril two times daily 90 mL 1  . fluticasone (FLONASE) 50 MCG/ACT nasal spray USE 1 TO 2 SPRAYS INTO THE  NOSE DAILY 32 g 2  . levothyroxine (SYNTHROID, LEVOTHROID) 88 MCG tablet Take 1 tablet by mouth  daily 90 tablet 1  . montelukast (SINGULAIR) 10 MG tablet Take 1 tablet by mouth at  bedtime 90 tablet 1  . SUMAtriptan (IMITREX) 50 MG tablet Take 1 tablet (50 mg total) by mouth as needed. May repeat in 2 hours if sxs persist.disp 1 month supply 30 tablet 3   No current facility-administered medications on file prior to visit.    No Known Allergies  Family History  Problem Relation Age of Onset  . Cancer Brother 20    colon cancer  . Hyperlipidemia Mother   . Hypertension Father   . Hyperlipidemia Father   . Heart attack Father   . Diabetes Father     History   Social History  . Marital Status: Married    Spouse Name: N/A  . Number of Children: N/A  . Years of Education: N/A   Social History Main Topics  . Smoking status: Never Smoker   . Smokeless tobacco: Not on file  . Alcohol Use: Yes     Comment: occ.  . Drug Use: No  . Sexual Activity: Not on file   Other Topics Concern  . None   Social History Narrative    Review of Systems - See HPI.  All other ROS are negative.  BP 108/60 mmHg  Pulse 99  Temp(Src) 99 F (37.2 C) (Oral)  Resp 16   Ht 5' (1.524 m)  Wt 127 lb 6 oz (57.777 kg)  BMI 24.88 kg/m2  SpO2 99%  LMP 09/28/2014  Physical Exam  Constitutional: She is oriented to person, place, and time and well-developed, well-nourished, and in no distress.  HENT:  Head: Normocephalic and atraumatic.  Right Ear: Tympanic membrane, external ear and ear canal normal.  Left Ear: Tympanic membrane, external ear and ear canal normal.  Nose: Mucosal edema and rhinorrhea present. Right sinus exhibits maxillary sinus tenderness. Left sinus exhibits maxillary sinus tenderness.  Mouth/Throat: Uvula is midline, oropharynx is clear and moist and mucous membranes are normal.  Eyes: Conjunctivae are normal. Pupils are equal, round, and reactive to light.  Neck: Neck supple. No thyromegaly present.  Cardiovascular: Normal rate, regular rhythm, normal heart sounds and intact distal pulses.   Pulmonary/Chest: Effort normal and breath sounds normal. No respiratory distress. She has no wheezes. She has no rales. She exhibits no tenderness.  Lymphadenopathy:    She has no cervical adenopathy.  Neurological: She is alert and oriented to person, place, and time.  Skin: Skin is warm and dry. No rash noted.  Psychiatric: Affect normal.  Vitals reviewed.  Recent Results (from the past 2160 hour(s))  Lipid panel     Status: Abnormal   Collection Time: 07/20/14  4:29 PM  Result Value Ref Range   Cholesterol 168 0 - 200 mg/dL    Comment: ATP III Classification       Desirable:  < 200 mg/dL               Borderline High:  200 - 239 mg/dL          High:  > = 161 mg/dL   Triglycerides 096.0 (H) 0.0 - 149.0 mg/dL    Comment: Normal:  <454 mg/dLBorderline High:  150 - 199 mg/dL   HDL 09.81 >19.14 mg/dL   VLDL 78.2 0.0 - 95.6 mg/dL   LDL Cholesterol 85 0 - 99 mg/dL   Total CHOL/HDL Ratio 3     Comment:                Men          Women1/2 Average Risk     3.4          3.3Average Risk          5.0          4.42X Average Risk          9.6           7.13X Average Risk          15.0          11.0                       NonHDL 118.90     Comment: NOTE:  Non-HDL goal should be 30 mg/dL higher than patient's LDL goal (i.e. LDL goal of < 70 mg/dL, would have non-HDL goal of < 100 mg/dL)  Basic metabolic panel     Status: None   Collection Time: 07/20/14  4:29 PM  Result Value Ref Range   Sodium 135 135 - 145 mEq/L   Potassium 4.0 3.5 - 5.1 mEq/L   Chloride 105 96 - 112 mEq/L   CO2 23 19 - 32 mEq/L   Glucose, Bld 71 70 - 99 mg/dL   BUN 13 6 - 23 mg/dL   Creatinine, Ser 0.6 0.4 - 1.2 mg/dL   Calcium 9.0 8.4 - 21.3 mg/dL   GFR 086.57 >84.69 mL/min  TSH     Status: None   Collection Time: 07/20/14  4:29 PM  Result Value Ref Range   TSH 0.77 0.35 - 4.50 uIU/mL  Hepatic function panel     Status: None   Collection Time: 07/20/14  4:29 PM  Result Value Ref Range   Total Bilirubin 0.3 0.2 - 1.2 mg/dL   Bilirubin, Direct 0.0 0.0 - 0.3 mg/dL   Alkaline Phosphatase 50 39 - 117 U/L   AST 20 0 - 37 U/L   ALT 11 0 - 35 U/L   Total Protein 7.4 6.0 - 8.3 g/dL   Albumin 4.3 3.5 - 5.2 g/dL  CBC with Differential     Status: Abnormal   Collection Time: 07/20/14  4:29 PM  Result Value Ref Range   WBC 6.2 4.0 - 10.5 K/uL   RBC 4.13 3.87 - 5.11 Mil/uL   Hemoglobin 10.2 (L) 12.0 - 15.0 g/dL   HCT 62.9 (L) 52.8 - 41.3 %   MCV 78.8 78.0 - 100.0 fl   MCHC 31.4 30.0 - 36.0 g/dL   RDW 24.4 (H) 01.0 -  15.5 %   Platelets 314.0 150.0 - 400.0 K/uL   Neutrophils Relative % 56.1 43.0 - 77.0 %   Lymphocytes Relative 33.9 12.0 - 46.0 %   Monocytes Relative 7.3 3.0 - 12.0 %   Eosinophils Relative 2.2 0.0 - 5.0 %   Basophils Relative 0.5 0.0 - 3.0 %   Neutro Abs 3.5 1.4 - 7.7 K/uL   Lymphs Abs 2.1 0.7 - 4.0 K/uL   Monocytes Absolute 0.5 0.1 - 1.0 K/uL   Eosinophils Absolute 0.1 0.0 - 0.7 K/uL   Basophils Absolute 0.0 0.0 - 0.1 K/uL  Vitamin D (25 hydroxy)     Status: Abnormal   Collection Time: 07/20/14  4:29 PM  Result Value Ref Range   VITD 21.16 (L)  30.00 - 100.00 ng/mL  POCT Influenza A/B     Status: Abnormal   Collection Time: 09/29/14 11:58 AM  Result Value Ref Range   Influenza A, POC Positive    Influenza B, POC Negative     Assessment/Plan: Influenza A Unfortunately patient is outside of the timeframe where Tamiflu will be beneficial. Supportive measures discussed with patient. Rest and hydration will be daily. Patient also being treated for superimposed acute bacterial sinusitis. Return precautions discussed with patient.   Acute bacterial sinusitis Rx Augmentin. Increase fluid intake. Rest. Saline nasal spray. Rx Flonase. Continue other medications as directed. Use Mucinex for congestion. Place humidifier in bedroom. Return precautions discussed with patient.

## 2014-09-29 NOTE — Patient Instructions (Signed)
Please take antibiotic as directed.  Increase fluid intake.  Use Saline nasal spray.  Take a daily multivitamin. Use Plain Mucinex for congestion.  Place a humidifier in the bedroom.  Please call or return clinic if symptoms are not improving.  Sinusitis Sinusitis is redness, soreness, and swelling (inflammation) of the paranasal sinuses. Paranasal sinuses are air pockets within the bones of your face (beneath the eyes, the middle of the forehead, or above the eyes). In healthy paranasal sinuses, mucus is able to drain out, and air is able to circulate through them by way of your nose. However, when your paranasal sinuses are inflamed, mucus and air can become trapped. This can allow bacteria and other germs to grow and cause infection. Sinusitis can develop quickly and last only a short time (acute) or continue over a long period (chronic). Sinusitis that lasts for more than 12 weeks is considered chronic.  CAUSES  Causes of sinusitis include:  Allergies.  Structural abnormalities, such as displacement of the cartilage that separates your nostrils (deviated septum), which can decrease the air flow through your nose and sinuses and affect sinus drainage.  Functional abnormalities, such as when the small hairs (cilia) that line your sinuses and help remove mucus do not work properly or are not present. SYMPTOMS  Symptoms of acute and chronic sinusitis are the same. The primary symptoms are pain and pressure around the affected sinuses. Other symptoms include:  Upper toothache.  Earache.  Headache.  Bad breath.  Decreased sense of smell and taste.  A cough, which worsens when you are lying flat.  Fatigue.  Fever.  Thick drainage from your nose, which often is green and may contain pus (purulent).  Swelling and warmth over the affected sinuses. DIAGNOSIS  Your caregiver will perform a physical exam. During the exam, your caregiver may:  Look in your nose for signs of abnormal  growths in your nostrils (nasal polyps).  Tap over the affected sinus to check for signs of infection.  View the inside of your sinuses (endoscopy) with a special imaging device with a light attached (endoscope), which is inserted into your sinuses. If your caregiver suspects that you have chronic sinusitis, one or more of the following tests may be recommended:  Allergy tests.  Nasal culture A sample of mucus is taken from your nose and sent to a lab and screened for bacteria.  Nasal cytology A sample of mucus is taken from your nose and examined by your caregiver to determine if your sinusitis is related to an allergy. TREATMENT  Most cases of acute sinusitis are related to a viral infection and will resolve on their own within 10 days. Sometimes medicines are prescribed to help relieve symptoms (pain medicine, decongestants, nasal steroid sprays, or saline sprays).  However, for sinusitis related to a bacterial infection, your caregiver will prescribe antibiotic medicines. These are medicines that will help kill the bacteria causing the infection.  Rarely, sinusitis is caused by a fungal infection. In theses cases, your caregiver will prescribe antifungal medicine. For some cases of chronic sinusitis, surgery is needed. Generally, these are cases in which sinusitis recurs more than 3 times per year, despite other treatments. HOME CARE INSTRUCTIONS   Drink plenty of water. Water helps thin the mucus so your sinuses can drain more easily.  Use a humidifier.  Inhale steam 3 to 4 times a day (for example, sit in the bathroom with the shower running).  Apply a warm, moist washcloth to your face 3  to 4 times a day, or as directed by your caregiver.  Use saline nasal sprays to help moisten and clean your sinuses.  Take over-the-counter or prescription medicines for pain, discomfort, or fever only as directed by your caregiver. SEEK IMMEDIATE MEDICAL CARE IF:  You have increasing pain or  severe headaches.  You have nausea, vomiting, or drowsiness.  You have swelling around your face.  You have vision problems.  You have a stiff neck.  You have difficulty breathing. MAKE SURE YOU:   Understand these instructions.  Will watch your condition.  Will get help right away if you are not doing well or get worse. Document Released: 07/24/2005 Document Revised: 10/16/2011 Document Reviewed: 08/08/2011 [employer] Patient Information 2014 [employer].   

## 2014-10-16 ENCOUNTER — Other Ambulatory Visit: Payer: Self-pay | Admitting: Family Medicine

## 2014-10-16 NOTE — Telephone Encounter (Signed)
Med filled.  

## 2014-12-11 ENCOUNTER — Encounter: Payer: Self-pay | Admitting: Gastroenterology

## 2014-12-30 ENCOUNTER — Other Ambulatory Visit: Payer: Self-pay | Admitting: Family Medicine

## 2014-12-30 NOTE — Telephone Encounter (Signed)
Med filled.  

## 2015-02-26 ENCOUNTER — Ambulatory Visit (INDEPENDENT_AMBULATORY_CARE_PROVIDER_SITE_OTHER): Payer: Self-pay | Admitting: Physician Assistant

## 2015-02-26 ENCOUNTER — Encounter: Payer: Self-pay | Admitting: Physician Assistant

## 2015-02-26 ENCOUNTER — Encounter: Payer: Self-pay | Admitting: Gastroenterology

## 2015-02-26 VITALS — BP 104/72 | HR 88 | Temp 98.1°F | Ht 60.0 in | Wt 130.6 lb

## 2015-02-26 DIAGNOSIS — J00 Acute nasopharyngitis [common cold]: Secondary | ICD-10-CM

## 2015-02-26 DIAGNOSIS — J45901 Unspecified asthma with (acute) exacerbation: Secondary | ICD-10-CM

## 2015-02-26 DIAGNOSIS — J208 Acute bronchitis due to other specified organisms: Principal | ICD-10-CM

## 2015-02-26 DIAGNOSIS — B9689 Other specified bacterial agents as the cause of diseases classified elsewhere: Secondary | ICD-10-CM

## 2015-02-26 MED ORDER — PREDNISONE 20 MG PO TABS: 40.0000 mg | ORAL_TABLET | Freq: Every day | ORAL | Status: DC

## 2015-02-26 MED ORDER — DOXYCYCLINE HYCLATE 100 MG PO CAPS: 100.0000 mg | ORAL_CAPSULE | Freq: Two times a day (BID) | ORAL | Status: DC

## 2015-02-26 MED ORDER — HYDROCOD POLST-CPM POLST ER 10-8 MG/5ML PO SUER
5.0000 mL | Freq: Two times a day (BID) | ORAL | Status: DC | PRN
Start: 2015-02-26 — End: 2015-07-20

## 2015-02-26 MED ORDER — IPRATROPIUM-ALBUTEROL 0.5-2.5 (3) MG/3ML IN SOLN: 3.0000 mL | Freq: Four times a day (QID) | RESPIRATORY_TRACT | Status: DC

## 2015-02-26 NOTE — Progress Notes (Signed)
   Patient presents to clinic today c/o productive cough and chest congestion x 4 weeks. Endorses sputum has become dark green. Denies fever, chills, chest pain. Notes shortness of breath due to nasal congestion and when coughing. Has history of asthma and has been using Albuterol every 4 hours. Has been taking some of her husband's Augmentin without relief in symptoms.  Past Medical History  Diagnosis Date  . Endometriosis   . Hypothyroidism     Current Outpatient Prescriptions on File Prior to Visit  Medication Sig Dispense Refill  . albuterol (PROAIR HFA) 108 (90 BASE) MCG/ACT inhaler Inhale 2 puffs into the lungs every 4 (four) hours as needed for wheezing. 1 Inhaler 6  . Azelastine HCl 0.15 % SOLN use 2 sprays in each  nostril two times daily 90 mL 1  . fluticasone (FLONASE) 50 MCG/ACT nasal spray USE 1 TO 2 SPRAYS INTO THE  NOSE DAILY 32 g 2  . levothyroxine (SYNTHROID, LEVOTHROID) 88 MCG tablet Take 1 tablet by mouth  daily 90 tablet 1  . montelukast (SINGULAIR) 10 MG tablet Take 1 tablet by mouth at  bedtime 90 tablet 1  . SUMAtriptan (IMITREX) 50 MG tablet Take 1 tablet (50 mg total) by mouth as needed. May repeat in 2 hours if sxs persist.disp 1 month supply (Patient not taking: Reported on 02/26/2015) 30 tablet 3   No current facility-administered medications on file prior to visit.    No Known Allergies  Family History  Problem Relation Age of Onset  . Cancer Brother 62    colon cancer  . Hyperlipidemia Mother   . Hypertension Father   . Hyperlipidemia Father   . Heart attack Father   . Diabetes Father     History   Social History  . Marital Status: Married    Spouse Name: N/A  . Number of Children: N/A  . Years of Education: N/A   Social History Main Topics  . Smoking status: Never Smoker   . Smokeless tobacco: Not on file  . Alcohol Use: Yes     Comment: occ.  . Drug Use: No  . Sexual Activity: Not on file   Other Topics Concern  . None   Social  History Narrative   Review of Systems - See HPI.  All other ROS are negative.  BP 104/72 mmHg  Pulse 88  Temp(Src) 98.1 F (36.7 C) (Oral)  Ht 5' (1.524 m)  Wt 130 lb 9.6 oz (59.24 kg)  BMI 25.51 kg/m2  SpO2 99%  LMP 02/13/2015  Physical Exam  Constitutional: She is well-developed, well-nourished, and in no distress.  HENT:  Head: Normocephalic and atraumatic.  Right Ear: External ear normal.  Left Ear: External ear normal.  Nose: Nose normal.  Mouth/Throat: Oropharynx is clear and moist. No oropharyngeal exudate.  TM within normal limits bilaterally.  Eyes: Conjunctivae are normal.  Neck: Neck supple.  Cardiovascular: Normal rate, regular rhythm, normal heart sounds and intact distal pulses.   Pulmonary/Chest: Effort normal. No respiratory distress. She has wheezes. She exhibits no tenderness.  Lymphadenopathy:    She has no cervical adenopathy.  Skin: Skin is warm and dry. No rash noted.  Psychiatric: Affect normal.  Vitals reviewed.  Assessment/Plan: Acute bacterial bronchitis W/ exacerbation of reactive airway disease. Duoneb given. Rx Doxycycline. Rx Tussionex. Rx 5-day burst of prednisone. Supportive measures discussed. Follow-up on Monday if marked improvement is not noted.

## 2015-02-26 NOTE — Assessment & Plan Note (Signed)
W/ exacerbation of reactive airway disease. Duoneb given. Rx Doxycycline. Rx Tussionex. Rx 5-day burst of prednisone. Supportive measures discussed. Follow-up on Monday if marked improvement is not noted.

## 2015-02-26 NOTE — Progress Notes (Signed)
Pre visit review using our clinic review tool, if applicable. No additional management support is needed unless otherwise documented below in the visit note. 

## 2015-02-26 NOTE — Patient Instructions (Signed)
Please take the Doxycycline as directed. Increase fluid intake and get plenty of rest. Get some plain Mucinex and begin taking twice daily. Use Tussionex as directed for cough. The prednisone burst will help open up your lungs. Continue asthma medications as directed.  Follow-up if symptoms not improving by Monday.

## 2015-03-02 ENCOUNTER — Encounter: Payer: Self-pay | Admitting: Physician Assistant

## 2015-03-02 MED ORDER — BECLOMETHASONE DIPROPIONATE 80 MCG/ACT IN AERS: 1.0000 | INHALATION_SPRAY | Freq: Two times a day (BID) | RESPIRATORY_TRACT | Status: DC

## 2015-03-04 ENCOUNTER — Encounter: Payer: Self-pay | Admitting: Family Medicine

## 2015-03-04 ENCOUNTER — Telehealth: Payer: Self-pay | Admitting: *Deleted

## 2015-03-04 MED ORDER — FLUTICASONE PROPIONATE 50 MCG/ACT NA SUSP: NASAL | Status: DC

## 2015-03-04 MED ORDER — FLUTICASONE PROPIONATE 50 MCG/ACT NA SUSP: NASAL | Status: AC

## 2015-03-04 NOTE — Telephone Encounter (Signed)
Rx filled

## 2015-03-04 NOTE — Telephone Encounter (Signed)
Patient requesting Flonase to local pharmacy per MyChart message.  Rx sent.

## 2015-04-13 ENCOUNTER — Encounter: Payer: Self-pay | Admitting: Family Medicine

## 2015-04-13 DIAGNOSIS — M25512 Pain in left shoulder: Secondary | ICD-10-CM

## 2015-04-13 NOTE — Telephone Encounter (Signed)
Referral placed.

## 2015-04-29 ENCOUNTER — Telehealth: Payer: Self-pay | Admitting: *Deleted

## 2015-04-29 NOTE — Telephone Encounter (Signed)
PA for QVAR initiated. Awaiting determination. JG//CMA  

## 2015-05-03 ENCOUNTER — Encounter: Payer: Self-pay | Admitting: General Practice

## 2015-05-03 ENCOUNTER — Encounter: Payer: Self-pay | Admitting: Family Medicine

## 2015-05-03 MED ORDER — FLUNISOLIDE HFA 80 MCG/ACT IN AERS: 2.0000 | INHALATION_SPRAY | Freq: Two times a day (BID) | RESPIRATORY_TRACT | Status: DC

## 2015-05-06 ENCOUNTER — Telehealth: Payer: Self-pay | Admitting: Family Medicine

## 2015-05-06 MED ORDER — FLUTICASONE PROPIONATE HFA 110 MCG/ACT IN AERO: 1.0000 | INHALATION_SPRAY | Freq: Two times a day (BID) | RESPIRATORY_TRACT | Status: DC

## 2015-05-06 NOTE — Telephone Encounter (Signed)
Flovent- , 1 puff bid

## 2015-05-06 NOTE — Telephone Encounter (Signed)
Medication filled to pharmacy as requested.   

## 2015-05-06 NOTE — Telephone Encounter (Signed)
Please advise on which medication you would prefer?

## 2015-05-06 NOTE — Telephone Encounter (Signed)
Caller name: Mary Relation to pt: [facility]  Call back number: [phone]  Pharmacy: [facility]   Reason for call:  As per pharmacist patient would like levothyroxine (SYNTHROID, LEVOTHROID) 88 MCG tablet transferred to retail pharmacy. [facility] states manufacturer would be fandoze is this ok. Please advise.

## 2015-05-06 NOTE — Telephone Encounter (Signed)
Pt called stating that the only 2 meds her insurance will cover without prior auth are Asmanex and Flovent. She said she is leaving town today at 1:00pm and needs to have meds before she leaves. She said her and Shanda Bumps were emailing & she sent an email back but didn't get a response 05/03/15.   Please send to NEW Pharmacy:  [facility] on [address] in [place]

## 2015-05-06 NOTE — Telephone Encounter (Signed)
Called and gave the verbal ok for pt to receive that manufacturer.

## 2015-05-13 NOTE — Telephone Encounter (Signed)
flovent inhaler was sent in on 9.29.16 1 inhaler with 12 refills

## 2015-05-13 NOTE — Telephone Encounter (Signed)
PA denied. Covered alternatives include Asmanex and Flovent. Please advise. JG//CMA

## 2015-05-13 NOTE — Telephone Encounter (Signed)
Script was already sent for [employer]

## 2015-06-09 ENCOUNTER — Encounter: Payer: Self-pay | Admitting: Family Medicine

## 2015-06-09 DIAGNOSIS — Z1211 Encounter for screening for malignant neoplasm of colon: Secondary | ICD-10-CM

## 2015-06-11 ENCOUNTER — Ambulatory Visit (INDEPENDENT_AMBULATORY_CARE_PROVIDER_SITE_OTHER): Payer: BLUE CROSS/BLUE SHIELD | Admitting: Medical

## 2015-06-11 ENCOUNTER — Encounter: Payer: Self-pay | Admitting: Medical

## 2015-06-11 ENCOUNTER — Other Ambulatory Visit: Payer: Self-pay

## 2015-06-11 VITALS — BP 112/78 | HR 78 | Temp 98.4°F | Ht 60.0 in | Wt 129.0 lb

## 2015-06-11 DIAGNOSIS — B353 Tinea pedis: Secondary | ICD-10-CM | POA: Diagnosis not present

## 2015-06-11 NOTE — Progress Notes (Signed)
Subjective:    Patient ID: Teresa Guzman, female    DOB: [birth year], 34 y.o.   MRN: [id number]  HPI  Pt in with some rt foot. Pt has a lot of itching to feet. Pt states symptoms since June. Pt has soaked foot in epson salt. Also tried lamisil, tinactin, and gold bond powder. This has not helped. Pt states this is getting worse progressively.  Pt also tried triamcinolone and it did not improve the rash.   Chronic recurrent itching and cracking of rt foot.    Review of Systems  Constitutional: Negative for fever, chills and fatigue.  Respiratory: Negative for cough, chest tightness, shortness of breath and wheezing.   Cardiovascular: Negative for chest pain and palpitations.  Skin: Positive for rash.  Neurological: Negative for dizziness and headaches.  Psychiatric/Behavioral: Negative for behavioral problems and confusion.    Past Medical History  Diagnosis Date  . Endometriosis   . Hypothyroidism     Social History   Social History  . Marital Status: Married    Spouse Name: N/A  . Number of Children: N/A  . Years of Education: N/A   Occupational History  . Not on file.   Social History Main Topics  . Smoking status: Never Smoker   . Smokeless tobacco: Not on file  . Alcohol Use: Yes     Comment: occ.  . Drug Use: No  . Sexual Activity: Not on file   Other Topics Concern  . Not on file   Social History Narrative    Past Surgical History  Procedure Laterality Date  . Arm surgery Left 1987  . Cyst removal hand      Family History  Problem Relation Age of Onset  . Cancer Brother 27    colon cancer  . Hyperlipidemia Mother   . Hypertension Father   . Hyperlipidemia Father   . Heart attack Father   . Diabetes Father     No Known Allergies  Current Outpatient Prescriptions on File Prior to Visit  Medication Sig Dispense Refill  . albuterol (PROAIR HFA) 108 (90 BASE) MCG/ACT inhaler Inhale 2 puffs into the lungs every 4 (four) hours as needed for  wheezing. 1 Inhaler 6  . Azelastine HCl 0.15 % SOLN use 2 sprays in each  nostril two times daily 90 mL 1  . chlorpheniramine-HYDROcodone (TUSSIONEX PENNKINETIC ER) 10-8 MG/5ML SUER Take 5 mLs by mouth every 12 (twelve) hours as needed for cough. 140 mL 0  . fluticasone (FLONASE) 50 MCG/ACT nasal spray USE 1 TO 2 SPRAYS INTO THE  NOSE DAILY 32 g 2  . fluticasone (FLOVENT HFA) 110 MCG/ACT inhaler Inhale 1 puff into the lungs 2 (two) times daily. 1 Inhaler 12  . levothyroxine (SYNTHROID, LEVOTHROID) 88 MCG tablet Take 1 tablet by mouth  daily 90 tablet 1  . montelukast (SINGULAIR) 10 MG tablet Take 1 tablet by mouth at  bedtime 90 tablet 1   Current Facility-Administered Medications on File Prior to Visit  Medication Dose Route Frequency Provider Last Rate Last Dose  . ipratropium-albuterol (DUONEB) 0.5-2.5 (3) MG/3ML nebulizer solution 3 mL  3 mL Nebulization Q6H Waldon Merl, PA-C        BP 112/78 mmHg  Pulse 78  Temp(Src) 98.4 F (36.9 C) (Oral)  Ht 5' (1.524 m)  Wt 129 lb (58.514 kg)  BMI 25.19 kg/m2  SpO2 98%       Objective:   Physical Exam   General- No acute distress. Pleasant  patient. . Rt foot- distal foot and toes dry flaky appearance with moderate  Cracking between toes.. One are between distal 1st and 2nd metatarsal area looks like may be plantar wart.      Assessment & Plan:  You may have severe case of fungal infection vs dermatitis. Would recommend continuing lamisil but since you failed various treatments will refer you to dermatolgist.  Follow up her as needed.

## 2015-06-11 NOTE — Patient Instructions (Signed)
You may have severe case of fungal infection vs dermatitis. Would recommend continuing lamisil but since you failed various treatments will refer you to dermatolgist.  Follow up her as needed.

## 2015-06-11 NOTE — Progress Notes (Signed)
Pre visit review using our clinic review tool, if applicable. No additional management support is needed unless otherwise documented below in the visit note. 

## 2015-06-15 ENCOUNTER — Telehealth: Payer: Self-pay | Admitting: Family Medicine

## 2015-06-15 NOTE — Telephone Encounter (Signed)
error 

## 2015-06-21 ENCOUNTER — Encounter: Payer: Self-pay | Admitting: Family Medicine

## 2015-06-21 ENCOUNTER — Ambulatory Visit (INDEPENDENT_AMBULATORY_CARE_PROVIDER_SITE_OTHER): Payer: BLUE CROSS/BLUE SHIELD | Admitting: Family Medicine

## 2015-06-21 ENCOUNTER — Encounter: Payer: Self-pay | Admitting: General Practice

## 2015-06-21 VITALS — BP 112/80 | HR 91 | Temp 98.0°F | Resp 16 | Ht 60.0 in | Wt 132.0 lb

## 2015-06-21 DIAGNOSIS — R21 Rash and other nonspecific skin eruption: Secondary | ICD-10-CM | POA: Diagnosis not present

## 2015-06-21 DIAGNOSIS — L089 Local infection of the skin and subcutaneous tissue, unspecified: Secondary | ICD-10-CM | POA: Diagnosis not present

## 2015-06-21 MED ORDER — PERMETHRIN 5 % EX CREA: 1.0000 "application " | TOPICAL_CREAM | Freq: Once | CUTANEOUS | Status: DC

## 2015-06-21 MED ORDER — CEPHALEXIN 500 MG PO CAPS: 500.0000 mg | ORAL_CAPSULE | Freq: Three times a day (TID) | ORAL | Status: AC

## 2015-06-21 MED ORDER — CLOTRIMAZOLE-BETAMETHASONE 1-0.05 % EX CREA: 1.0000 "application " | TOPICAL_CREAM | Freq: Two times a day (BID) | CUTANEOUS | Status: DC

## 2015-06-21 NOTE — Progress Notes (Signed)
Pre visit review using our clinic review tool, if applicable. No additional management support is needed unless otherwise documented below in the visit note. 

## 2015-06-21 NOTE — Progress Notes (Signed)
   Subjective:    Patient ID: Teresa Guzman, female    DOB: 05/08/1981, 34 y.o.   MRN: [id number]  HPI Peeling skin- R foot, pt reports sxs started in July.  Attempted topical Eczema creams and athlete's foot creams w/o relief.  Pt noted a few bumps on both hands that are very itchy starting 10 days ago.  These are now worsening.  No one at home w/ similar bumps or itching.  Foot is oozing, cracked, painful, and itchy.  Pt has derm appt for Wednesday.   Review of Systems For ROS see HPI     Objective:   Physical Exam  Constitutional: She is oriented to person, place, and time. She appears well-developed and well-nourished. No distress.  HENT:  Head: Normocephalic and atraumatic.  Neurological: She is alert and oriented to person, place, and time.  Skin:  Plantar surface of R foot is blistered, cracked, peeling, w/ oozing between toes and erythema consistent w/ secondary infxn.  Bilateral hands w/ itchy, vesicular lesions extending in linear pattern up forearms consistent w/ scabies burrows  Vitals reviewed.         Assessment & Plan:

## 2015-06-21 NOTE — Patient Instructions (Signed)
Follow up as needed Keep your derm appt on Wednesday Apply the elimite cream from jaw down- cover entire body and sleep in it overnight.  Wash all sheets and towels after you shower tomorrow morning Start the Keflex 3x/day for infection of the foot Apply the Lotrisone cream twice daily Call with any questions or concerns If you want to join us at the new [place] office, any scheduled appointments will automatically transfer and we will see you at [address], [place], [place] [id number] (OPENING 08/10/15) Hang in there!!!

## 2015-06-21 NOTE — Assessment & Plan Note (Signed)
Rash on palms and forearms consistent w/ scabies infxn.  Reviewed treatment and need to wash sheets/towels in order to avoid spreading to other family members.  Pt expressed understanding and is in agreement w/ plan.

## 2015-06-21 NOTE — Assessment & Plan Note (Signed)
New.  Pt's apparent fungal infxn of R foot now has secondary bacterial infxn.  Start keflex.  Topical Lotrisone cream.  Reviewed supportive care and red flags that should prompt return.  Pt expressed understanding and is in agreement w/ plan.

## 2015-07-20 ENCOUNTER — Telehealth: Payer: Self-pay | Admitting: Behavioral Health

## 2015-07-20 ENCOUNTER — Encounter: Payer: Self-pay | Admitting: Behavioral Health

## 2015-07-20 NOTE — Telephone Encounter (Signed)
Pre-Visit Call completed with patient and chart updated.   Pre-Visit Info documented in Specialty Comments under [employer].    

## 2015-07-21 ENCOUNTER — Ambulatory Visit (INDEPENDENT_AMBULATORY_CARE_PROVIDER_SITE_OTHER): Payer: BLUE CROSS/BLUE SHIELD | Admitting: Family Medicine

## 2015-07-21 ENCOUNTER — Encounter: Payer: Self-pay | Admitting: Family Medicine

## 2015-07-21 ENCOUNTER — Other Ambulatory Visit (HOSPITAL_COMMUNITY)
Admission: RE | Admit: 2015-07-21 | Discharge: 2015-07-21 | Disposition: A | Discharge status: Home / Self Care | Payer: BLUE CROSS/BLUE SHIELD | Source: Ambulatory Visit | Attending: Family Medicine | Admitting: Family Medicine

## 2015-07-21 ENCOUNTER — Other Ambulatory Visit: Payer: Self-pay | Admitting: General Practice

## 2015-07-21 VITALS — BP 110/81 | HR 92 | Temp 98.0°F | Resp 16 | Ht 60.0 in | Wt 131.0 lb

## 2015-07-21 DIAGNOSIS — E039 Hypothyroidism, unspecified: Secondary | ICD-10-CM

## 2015-07-21 DIAGNOSIS — Z01419 Encounter for gynecological examination (general) (routine) without abnormal findings: Secondary | ICD-10-CM | POA: Diagnosis present

## 2015-07-21 DIAGNOSIS — Z124 Encounter for screening for malignant neoplasm of cervix: Secondary | ICD-10-CM | POA: Diagnosis not present

## 2015-07-21 DIAGNOSIS — Z1151 Encounter for screening for human papillomavirus (HPV): Secondary | ICD-10-CM | POA: Diagnosis present

## 2015-07-21 DIAGNOSIS — Z0000 Encounter for general adult medical examination without abnormal findings: Secondary | ICD-10-CM

## 2015-07-21 LAB — CBC WITH DIFFERENTIAL/PLATELET
Basophils Absolute: 0.1 10*3/uL (ref 0.0–0.1)
Basophils Relative: 0.9 % (ref 0.0–3.0)
Eosinophils Absolute: 0.1 10*3/uL (ref 0.0–0.7)
Eosinophils Relative: 2.2 % (ref 0.0–5.0)
HCT: 37.9 % (ref 36.0–46.0)
Hemoglobin: 12.4 g/dL (ref 12.0–15.0)
Lymphocytes Relative: 27.1 % (ref 12.0–46.0)
Lymphs Abs: 1.7 10*3/uL (ref 0.7–4.0)
MCHC: 32.8 g/dL (ref 30.0–36.0)
MCV: 90.9 fl (ref 78.0–100.0)
Monocytes Absolute: 0.5 10*3/uL (ref 0.1–1.0)
Monocytes Relative: 7.4 % (ref 3.0–12.0)
Neutro Abs: 4 10*3/uL (ref 1.4–7.7)
Neutrophils Relative %: 62.4 % (ref 43.0–77.0)
Platelets: 259 10*3/uL (ref 150.0–400.0)
RBC: 4.17 Mil/uL (ref 3.87–5.11)
RDW: 15 % (ref 11.5–15.5)
WBC: 6.4 10*3/uL (ref 4.0–10.5)

## 2015-07-21 LAB — BASIC METABOLIC PANEL
BUN: 10 mg/dL (ref 6–23)
CO2: 30 mEq/L (ref 19–32)
Calcium: 9.2 mg/dL (ref 8.4–10.5)
Chloride: 103 mEq/L (ref 96–112)
Creatinine, Ser: 0.81 mg/dL (ref 0.40–1.20)
GFR: 84.98 mL/min (ref >60.00)
Glucose, Bld: 74 mg/dL (ref 70–99)
Potassium: 3.9 mEq/L (ref 3.5–5.1)
Sodium: 139 mEq/L (ref 135–145)

## 2015-07-21 LAB — T4, FREE: Free T4: 1 ng/dL (ref 0.60–1.60)

## 2015-07-21 LAB — LIPID PANEL
Cholesterol: 152 mg/dL (ref 0–200)
HDL: 65.3 mg/dL (ref >39.00)
LDL Cholesterol: 69 mg/dL (ref 0–99)
NonHDL: 86.46
Total CHOL/HDL Ratio: 2
Triglycerides: 89 mg/dL (ref 0.0–149.0)
VLDL: 17.8 mg/dL (ref 0.0–40.0)

## 2015-07-21 LAB — HEPATIC FUNCTION PANEL
ALT: 9 U/L (ref 0–35)
AST: 18 U/L (ref 0–37)
Albumin: 4.1 g/dL (ref 3.5–5.2)
Alkaline Phosphatase: 57 U/L (ref 39–117)
Bilirubin, Direct: 0.1 mg/dL (ref 0.0–0.3)
Total Bilirubin: 0.3 mg/dL (ref 0.2–1.2)
Total Protein: 7.2 g/dL (ref 6.0–8.3)

## 2015-07-21 LAB — TSH: TSH: 4.77 u[IU]/mL — ABNORMAL HIGH (ref 0.35–4.50)

## 2015-07-21 LAB — VITAMIN D 25 HYDROXY (VIT D DEFICIENCY, FRACTURES): VITD: 19.3 ng/mL — ABNORMAL LOW (ref 30.00–100.00)

## 2015-07-21 LAB — T3, FREE: T3, Free: 2.9 pg/mL (ref 2.3–4.2)

## 2015-07-21 MED ORDER — LEVOTHYROXINE SODIUM 100 MCG PO TABS: 100.0000 ug | ORAL_TABLET | Freq: Every day | ORAL | Status: DC

## 2015-07-21 MED ORDER — VITAMIN D (ERGOCALCIFEROL) 1.25 MG (50000 UNIT) PO CAPS: 50000.0000 [IU] | ORAL_CAPSULE | ORAL | Status: DC

## 2015-07-21 NOTE — Assessment & Plan Note (Signed)
Pt continues to have skin sxs and dermatology suggested she have T3/T4 checked in addition to TSH level.  Labs ordered

## 2015-07-21 NOTE — Patient Instructions (Signed)
Follow up in 1 year or as needed We'll notify you of your lab results and make any changes if needed Call and schedule your colonoscopy w/ [facility] [phone] Keep up the good work on healthy diet and regular exercise- you look great! Call with any questions or concerns If you want to join us at the new [place] office, any scheduled appointments will automatically transfer and we will see you at [address], [place], [place] [id number] (OPENING 08/10/15) Happy Holidays!!!

## 2015-07-21 NOTE — Progress Notes (Signed)
Pre visit review using our clinic review tool, if applicable. No additional management support is needed unless otherwise documented below in the visit note. 

## 2015-07-21 NOTE — Progress Notes (Signed)
   Subjective:    Patient ID: Teresa GrinderDawn T Harpole, female    DOB: 01/03/1981, 34 y.o.   MRN: [id number]  HPI CPE- due for pap and colonoscopy (plans to schedule)   Review of Systems Patient reports no vision/ hearing changes, adenopathy,fever, weight change,  persistant/recurrent hoarseness , swallowing issues, chest pain, palpitations, edema, persistant/recurrent cough, hemoptysis, dyspnea (rest/exertional/paroxysmal nocturnal), gastrointestinal bleeding (melena, rectal bleeding), abdominal pain, significant heartburn, bowel changes, GU symptoms (dysuria, hematuria, incontinence), Gyn symptoms (abnormal  bleeding, pain),  syncope, focal weakness, memory loss, numbness & tingling, skin/hair/nail changes, abnormal bruising or bleeding, anxiety, or depression.     Objective:   Physical Exam        Assessment & Plan:

## 2015-07-21 NOTE — Assessment & Plan Note (Signed)
Pap collected. 

## 2015-07-21 NOTE — Assessment & Plan Note (Signed)
Pt's PE WNL.  Due for repeat colonoscopy- pt plans to schedule.  Pap done today.  Check labs.  Anticipatory guidance provided.

## 2015-07-22 ENCOUNTER — Other Ambulatory Visit: Payer: Self-pay | Admitting: Family Medicine

## 2015-07-22 LAB — CYTOLOGY - PAP

## 2015-07-22 MED ORDER — FLUCONAZOLE 150 MG PO TABS: 150.0000 mg | ORAL_TABLET | Freq: Once | ORAL | Status: DC

## 2015-07-27 ENCOUNTER — Encounter: Payer: Self-pay | Admitting: Gastroenterology

## 2015-08-08 DIAGNOSIS — I7774 Dissection of vertebral artery: Secondary | ICD-10-CM

## 2015-08-08 HISTORY — DX: Dissection of vertebral artery: I77.74

## 2015-09-09 ENCOUNTER — Emergency Department (HOSPITAL_COMMUNITY): Payer: BLUE CROSS/BLUE SHIELD

## 2015-09-09 ENCOUNTER — Encounter (HOSPITAL_COMMUNITY): Payer: Self-pay

## 2015-09-09 ENCOUNTER — Inpatient Hospital Stay (HOSPITAL_COMMUNITY)
Admission: EM | Admit: 2015-09-09 | Discharge: 2015-09-11 | DRG: 064 | Disposition: A | Discharge status: Home / Self Care | Payer: BLUE CROSS/BLUE SHIELD | Attending: Internal Medicine | Admitting: Internal Medicine

## 2015-09-09 DIAGNOSIS — I639 Cerebral infarction, unspecified: Secondary | ICD-10-CM | POA: Diagnosis present

## 2015-09-09 DIAGNOSIS — Z8673 Personal history of transient ischemic attack (TIA), and cerebral infarction without residual deficits: Secondary | ICD-10-CM | POA: Insufficient documentation

## 2015-09-09 DIAGNOSIS — I63031 Cerebral infarction due to thrombosis of right carotid artery: Secondary | ICD-10-CM

## 2015-09-09 DIAGNOSIS — I7774 Dissection of vertebral artery: Secondary | ICD-10-CM | POA: Diagnosis not present

## 2015-09-09 DIAGNOSIS — I638 Other cerebral infarction: Secondary | ICD-10-CM | POA: Diagnosis not present

## 2015-09-09 DIAGNOSIS — R260 Ataxic gait: Secondary | ICD-10-CM | POA: Diagnosis present

## 2015-09-09 DIAGNOSIS — J45909 Unspecified asthma, uncomplicated: Secondary | ICD-10-CM | POA: Diagnosis present

## 2015-09-09 DIAGNOSIS — E039 Hypothyroidism, unspecified: Secondary | ICD-10-CM | POA: Diagnosis present

## 2015-09-09 DIAGNOSIS — R29701 NIHSS score 1: Secondary | ICD-10-CM | POA: Diagnosis present

## 2015-09-09 DIAGNOSIS — G8191 Hemiplegia, unspecified affecting right dominant side: Secondary | ICD-10-CM | POA: Diagnosis present

## 2015-09-09 DIAGNOSIS — D649 Anemia, unspecified: Secondary | ICD-10-CM | POA: Diagnosis present

## 2015-09-09 DIAGNOSIS — N179 Acute kidney failure, unspecified: Secondary | ICD-10-CM | POA: Diagnosis present

## 2015-09-09 DIAGNOSIS — Z7951 Long term (current) use of inhaled steroids: Secondary | ICD-10-CM

## 2015-09-09 DIAGNOSIS — Z8249 Family history of ischemic heart disease and other diseases of the circulatory system: Secondary | ICD-10-CM

## 2015-09-09 DIAGNOSIS — N809 Endometriosis, unspecified: Secondary | ICD-10-CM | POA: Diagnosis present

## 2015-09-09 DIAGNOSIS — R760 Raised antibody titer: Secondary | ICD-10-CM | POA: Diagnosis present

## 2015-09-09 DIAGNOSIS — R531 Weakness: Secondary | ICD-10-CM | POA: Diagnosis not present

## 2015-09-09 DIAGNOSIS — I63541 Cerebral infarction due to unspecified occlusion or stenosis of right cerebellar artery: Secondary | ICD-10-CM | POA: Diagnosis not present

## 2015-09-09 DIAGNOSIS — I63111 Cerebral infarction due to embolism of right vertebral artery: Secondary | ICD-10-CM

## 2015-09-09 DIAGNOSIS — I63341 Cerebral infarction due to thrombosis of right cerebellar artery: Secondary | ICD-10-CM | POA: Diagnosis present

## 2015-09-09 HISTORY — DX: Cerebral infarction, unspecified: I63.9

## 2015-09-09 LAB — CBG MONITORING, ED: Glucose-Capillary: 75 mg/dL (ref 65–99)

## 2015-09-09 LAB — I-STAT BETA HCG BLOOD, ED (MC, WL, AP ONLY): I-stat hCG, quantitative: <5 m[IU]/mL (ref <5)

## 2015-09-09 LAB — COMPREHENSIVE METABOLIC PANEL
ALT: 11 U/L — ABNORMAL LOW (ref 14–54)
AST: 25 U/L (ref 15–41)
Albumin: 4.4 g/dL (ref 3.5–5.0)
Alkaline Phosphatase: 58 U/L (ref 38–126)
Anion gap: 12 (ref 5–15)
BUN: 14 mg/dL (ref 6–20)
CO2: 24 mmol/L (ref 22–32)
Calcium: 9.4 mg/dL (ref 8.9–10.3)
Chloride: 100 mmol/L — ABNORMAL LOW (ref 101–111)
Creatinine, Ser: 1.04 mg/dL — ABNORMAL HIGH (ref 0.44–1.00)
GFR calc Af Amer: >60 mL/min (ref >60)
GFR calc non Af Amer: >60 mL/min (ref >60)
Glucose, Bld: 79 mg/dL (ref 65–99)
Potassium: 3.8 mmol/L (ref 3.5–5.1)
Sodium: 136 mmol/L (ref 135–145)
Total Bilirubin: 0.7 mg/dL (ref 0.3–1.2)
Total Protein: 7.4 g/dL (ref 6.5–8.1)

## 2015-09-09 LAB — PROTIME-INR
INR: 1.08 (ref 0.00–1.49)
Prothrombin Time: 14.2 seconds (ref 11.6–15.2)

## 2015-09-09 LAB — I-STAT CHEM 8, ED
BUN: 19 mg/dL (ref 6–20)
Calcium, Ion: 1.14 mmol/L (ref 1.12–1.23)
Chloride: 99 mmol/L — ABNORMAL LOW (ref 101–111)
Creatinine, Ser: 1.2 mg/dL — ABNORMAL HIGH (ref 0.44–1.00)
Glucose, Bld: 75 mg/dL (ref 65–99)
HCT: 43 % (ref 36.0–46.0)
Hemoglobin: 14.6 g/dL (ref 12.0–15.0)
Potassium: 3.7 mmol/L (ref 3.5–5.1)
Sodium: 136 mmol/L (ref 135–145)
TCO2: 25 mmol/L (ref 0–100)

## 2015-09-09 LAB — CBC
HCT: 37.9 % (ref 36.0–46.0)
Hemoglobin: 13.1 g/dL (ref 12.0–15.0)
MCH: 30.1 pg (ref 26.0–34.0)
MCHC: 34.6 g/dL (ref 30.0–36.0)
MCV: 87.1 fL (ref 78.0–100.0)
Platelets: 297 10*3/uL (ref 150–400)
RBC: 4.35 MIL/uL (ref 3.87–5.11)
RDW: 13.2 % (ref 11.5–15.5)
WBC: 11.1 10*3/uL — ABNORMAL HIGH (ref 4.0–10.5)

## 2015-09-09 LAB — APTT: aPTT: 26 seconds (ref 24–37)

## 2015-09-09 LAB — I-STAT TROPONIN, ED: Troponin i, poc: 0 ng/mL (ref 0.00–0.08)

## 2015-09-09 LAB — DIFFERENTIAL
Basophils Absolute: 0 10*3/uL (ref 0.0–0.1)
Basophils Relative: 0 %
Eosinophils Absolute: 0 10*3/uL (ref 0.0–0.7)
Eosinophils Relative: 0 %
Lymphocytes Relative: 9 %
Lymphs Abs: 1 10*3/uL (ref 0.7–4.0)
Monocytes Absolute: 0.8 10*3/uL (ref 0.1–1.0)
Monocytes Relative: 7 %
Neutro Abs: 9.2 10*3/uL — ABNORMAL HIGH (ref 1.7–7.7)
Neutrophils Relative %: 84 %

## 2015-09-09 LAB — SEDIMENTATION RATE: Sed Rate: 11 mm/hr (ref 0–22)

## 2015-09-09 MED ORDER — ACETAMINOPHEN 325 MG PO TABS
650.0000 mg | ORAL_TABLET | Freq: Four times a day (QID) | ORAL | Status: DC | PRN
  Administered 2015-09-09 – 2015-09-10 (×2): 650 mg via ORAL
  Filled 2015-09-09 (×2): qty 2

## 2015-09-09 MED ORDER — ASPIRIN EC 325 MG PO TBEC
325.0000 mg | DELAYED_RELEASE_TABLET | Freq: Every day | ORAL | Status: DC
  Administered 2015-09-09 – 2015-09-11 (×3): 325 mg via ORAL
  Filled 2015-09-09 (×3): qty 1

## 2015-09-09 MED ORDER — FLUTICASONE PROPIONATE HFA 110 MCG/ACT IN AERO: 1.0000 | INHALATION_SPRAY | Freq: Two times a day (BID) | RESPIRATORY_TRACT | Status: DC

## 2015-09-09 MED ORDER — GADOBENATE DIMEGLUMINE 529 MG/ML IV SOLN
13.0000 mL | Freq: Once | INTRAVENOUS | Status: AC | PRN
  Administered 2015-09-09: 13 mL via INTRAVENOUS

## 2015-09-09 MED ORDER — STROKE: EARLY STAGES OF RECOVERY BOOK: Freq: Once | Status: DC

## 2015-09-09 MED ORDER — LEVOTHYROXINE SODIUM 100 MCG PO TABS
100.0000 ug | ORAL_TABLET | Freq: Every day | ORAL | Status: DC
  Administered 2015-09-10 – 2015-09-11 (×2): 100 ug via ORAL
  Filled 2015-09-09 (×2): qty 1

## 2015-09-09 MED ORDER — SODIUM CHLORIDE 0.9 % IV SOLN
INTRAVENOUS | Status: DC
  Administered 2015-09-09: 15:00:00 via INTRAVENOUS

## 2015-09-09 MED ORDER — CLOPIDOGREL 5 MG/ML PEDIATRIC ORAL SUSPENSION: 75.0000 mg | Freq: Every day | ORAL | Status: DC

## 2015-09-09 MED ORDER — BUDESONIDE 0.25 MG/2ML IN SUSP
0.2500 mg | Freq: Two times a day (BID) | RESPIRATORY_TRACT | Status: DC
  Filled 2015-09-09: qty 2

## 2015-09-09 MED ORDER — SODIUM CHLORIDE 0.9 % IV BOLUS (SEPSIS)
1000.0000 mL | Freq: Once | INTRAVENOUS | Status: AC
  Administered 2015-09-09: 1000 mL via INTRAVENOUS

## 2015-09-09 MED ORDER — ENOXAPARIN SODIUM 40 MG/0.4ML ~~LOC~~ SOLN
40.0000 mg | SUBCUTANEOUS | Status: DC
  Administered 2015-09-09 – 2015-09-10 (×2): 40 mg via SUBCUTANEOUS
  Filled 2015-09-09 (×2): qty 0.4

## 2015-09-09 MED ORDER — MONTELUKAST SODIUM 10 MG PO TABS
10.0000 mg | ORAL_TABLET | Freq: Every day | ORAL | Status: DC
  Administered 2015-09-09 – 2015-09-10 (×2): 10 mg via ORAL
  Filled 2015-09-09 (×2): qty 1

## 2015-09-09 MED ORDER — CLOPIDOGREL BISULFATE 75 MG PO TABS
75.0000 mg | ORAL_TABLET | Freq: Every day | ORAL | Status: DC
  Administered 2015-09-09 – 2015-09-11 (×3): 75 mg via ORAL
  Filled 2015-09-09 (×3): qty 1

## 2015-09-09 MED ORDER — ONDANSETRON HCL 4 MG/2ML IJ SOLN
4.0000 mg | Freq: Four times a day (QID) | INTRAMUSCULAR | Status: DC | PRN
  Administered 2015-09-09 – 2015-09-10 (×2): 4 mg via INTRAVENOUS
  Filled 2015-09-09 (×2): qty 2

## 2015-09-09 NOTE — Progress Notes (Signed)
Pt received at this time alert, verbal with no noted distress. Neuro intact. Pt denies pain or discomfort. Telemetry monitoring. Pt oriented to room. Safety measures in place. Call bell within reach. Family at bedside. Will continue to monitor.

## 2015-09-09 NOTE — Code Documentation (Signed)
34yo female arriving to [facility] at 1 via private vehicle.  Patient reports arriving to the gym for her workout and feeling lightheaded and leaning to the right.  She completed her workout and went home.  She reports falling in the shower and having difficulty getting up.  Patient presented to the ED where a code stroke was called.  Patient to CT.  Stroke team to the bedside.  NIHSS 1, see documentation for details and code stroke times.  Patient with right leg drift. Of note, patient with h/o migraines and reports HA but not typical of her migraines.  Patient is tearful.  Dr. Amada Jupiter at the bedside.  Patient's symptoms are too mild to treat with tPA at this time, however, patient remains in the window to treat with tPA until 1015 should symptoms worsen.  Bedside handoff with ED RN Clydie Braun.

## 2015-09-09 NOTE — ED Notes (Signed)
Pt states had an episode of dizziness that started at 0545-- with leaning to the right and coordination seems off-- was walking into wall, when getting out of shower, right leg kept giving out, with vomiting.,

## 2015-09-09 NOTE — Progress Notes (Signed)
Pt c/o nausea. No order for antiemetic. Paged Md received new order for Zofran  IV. No noted distress. Cool wash cloth applied to head. Will continue to monitor.

## 2015-09-09 NOTE — ED Provider Notes (Signed)
CSN: [id number]     Arrival date & time 09/09/15  1478 History   First MD Initiated Contact with Patient 09/09/15 [phone]     Chief Complaint  Patient presents with  . Dizziness  . stroke like symptoms     @EDPCLEARED @ (Consider location/radiation/quality/duration/timing/severity/associated sxs/prior Treatment) HPI Patient presents with "falling to the right" and right-sided upper and lower extremity weakness. Patient states she was sitting in her car when symptoms began. She tried to walk but states she was running into walls and different objects. She proceeded to try to work out but was unable to. At home and sensation shower. It fell at this point. Husband states the patient had right grip strength weakness and was unable to stand on the right leg. Patient denies any [place] this time but states she woke with a "crick" in her neck on the right side 2 days ago. Denies any recent trauma. At several episodes of vomiting which is now resolved. Patient denies any visual changes. Denies any speech changes. Has persistent right grip strength weakness. Past Medical History  Diagnosis Date  . Endometriosis   . Hypothyroidism    Past Surgical History  Procedure Laterality Date  . Arm surgery Left 1987  . Cyst removal hand     Family History  Problem Relation Age of Onset  . Cancer Brother 22    colon cancer  . Hyperlipidemia Mother   . Hypertension Father   . Hyperlipidemia Father   . Heart attack Father   . Diabetes Father    Social History  Substance Use Topics  . Smoking status: Never Smoker   . Smokeless tobacco: None  . Alcohol Use: No     Comment: occ.   OB History    No data available     Review of Systems  Constitutional: Negative for fever and chills.  HENT: Negative for ear pain.   Eyes: Negative for visual disturbance.  Respiratory: Negative for shortness of breath.   Cardiovascular: Negative for chest pain.  Gastrointestinal: Positive for nausea and vomiting.  Negative for abdominal pain and diarrhea.  Musculoskeletal: Positive for neck pain. Negative for back pain and neck stiffness.  Skin: Negative for rash and wound.  Neurological: Positive for dizziness and weakness. Negative for syncope, light-headedness, numbness and headaches.  Psychiatric/Behavioral: The patient is nervous/anxious.   All other systems reviewed and are negative.     Allergies  Review of patient's allergies indicates no known allergies.  Home Medications   Prior to Admission medications   Medication Sig Start Date End Date Taking? Authorizing Provider  Azelastine HCl 0.15 % SOLN use 2 sprays in each  nostril two times daily 06/05/14  Yes Sheliah Hatch, MD  fluticasone [facility]) 50 MCG/ACT nasal spray USE 1 TO 2 SPRAYS INTO THE  NOSE DAILY 03/04/15  Yes Sheliah Hatch, MD  fluticasone (FLOVENT HFA) 110 MCG/ACT inhaler Inhale 1 puff into the lungs 2 (two) times daily. 05/06/15  Yes Sheliah Hatch, MD  levothyroxine (SYNTHROID, LEVOTHROID) 100 MCG tablet Take 1 tablet (100 mcg total) by mouth daily. 07/21/15  Yes Sheliah Hatch, MD  loratadine-pseudoephedrine (CLARITIN-D 12-HOUR) 5-120 MG tablet Take 1 tablet by mouth 2 (two) times daily.   Yes Historical Provider, MD  montelukast (SINGULAIR) 10 MG tablet Take 1 tablet by mouth at  bedtime 12/30/14  Yes Sheliah Hatch, MD  Multiple Vitamin (MULTIVITAMIN WITH MINERALS) TABS tablet Take 1 tablet by mouth daily.   Yes Historical Provider, MD  saccharomyces boulardii (FLORASTOR) 250 MG capsule Take 250 mg by mouth 2 (two) times daily.   Yes Historical Provider, MD  TURMERIC PO Take 1 Dose by mouth daily.   Yes Historical Provider, MD  UNABLE TO FIND Take 1 Dose by mouth daily as needed (pre-workout). C4 Pre Workout Powder: Vitamin C (as Ascorbic Acid), Niacin (as Niacinamide), Vitamin B6 (as Pyridoxal-5-Phosphate), Folic Acid, Vitamin B12 (as Methylcobalamin), Calcium, Beta Alanine, Creatine Nitrate, Arginine  AKG, Explosive Energy Blend, N-Acetyl-L-tyrosine, Caffeine Anhydrous (150mg ), Velvet Bean (Mucuna pruriens) seed extract, (standardized for L-Dopa), TeaCor Tetramethyluric Acid   Yes Historical Provider, MD  Vitamin D, Ergocalciferol, (DRISDOL) 50000 UNITS CAPS capsule Take 1 capsule (50,000 Units total) by mouth every 7 (seven) days. 07/21/15  Yes Sheliah Hatch, MD   BP 117/71 mmHg  Pulse 111  Temp(Src) 97.6 F (36.4 C) (Oral)  Resp 22  SpO2 100%  LMP 08/22/2015 Physical Exam  Constitutional: She is oriented to person, place, and time. She appears well-developed and well-nourished. No distress.  Patient is quite anxious  HENT:  Head: Normocephalic and atraumatic.  Mouth/Throat: Oropharynx is clear and moist. No oropharyngeal exudate.  Eyes: EOM are normal. Pupils are equal, round, and reactive to light.  No nystagmus noted  Neck: Normal range of motion. Neck supple.  No bruits or exacerbation of symptoms with range of motion of the neck.  Cardiovascular: Normal rate and regular rhythm.   Pulmonary/Chest: Effort normal and breath sounds normal. No stridor. No respiratory distress. She has no wheezes. She has no rales.  Abdominal: Soft. Bowel sounds are normal. She exhibits no distension and no mass. There is no tenderness. There is no rebound and no guarding.  Musculoskeletal: Normal range of motion. She exhibits no edema or tenderness.  Neurological: She is alert and oriented to person, place, and time.  Patient is alert and oriented x3 with clear, goal oriented speech. Patient has notable right grip strength weakness compared to the left. Proximal muscle strength on the right upper extremity is symmetric with the left upper extremity. There is no drift. Patient with mild drift to the right lower extremity compared to the left. Sensation is intact to light touch. Bilateral finger-to-nose is normal with no signs of dysmetria.   Skin: Skin is warm and dry. No rash noted. No erythema.   Nursing note and vitals reviewed.   ED Course  Procedures (including critical care time) Labs Review Labs Reviewed  CBC - Abnormal; Notable for the following:    WBC 11.1 (*)    All other components within normal limits  DIFFERENTIAL - Abnormal; Notable for the following:    Neutro Abs 9.2 (*)    All other components within normal limits  COMPREHENSIVE METABOLIC PANEL - Abnormal; Notable for the following:    Chloride 100 (*)    Creatinine, Ser 1.04 (*)    ALT 11 (*)    All other components within normal limits  I-STAT CHEM 8, ED - Abnormal; Notable for the following:    Chloride 99 (*)    Creatinine, Ser 1.20 (*)    All other components within normal limits  PROTIME-INR  APTT  CBG MONITORING, ED  I-STAT TROPOININ, ED  CBG MONITORING, ED  I-STAT BETA HCG BLOOD, ED ([facility], [facility], [facility] ONLY)    Imaging Review Ct Head Wo Contrast  09/09/2015  ADDENDUM REPORT: 09/09/2015 09:41 ADDENDUM: Study discussed by telephone with Dr. Ritta Slot on 09/09/2015 at 0935 hours. Electronically Signed   By: Odessa Fleming  M.D.   On: 09/09/2015 09:41  09/09/2015  CLINICAL DATA:  35 year old female code stroke. Acute onset dizziness and leaning to the right this morning. Initial encounter. EXAM: CT HEAD WITHOUT CONTRAST TECHNIQUE: Contiguous axial images were obtained from the base of the skull through the vertex without intravenous contrast. COMPARISON:  None. FINDINGS: Visualized paranasal sinuses and mastoids are clear. No osseous abnormality identified. Visualized orbits and scalp soft tissues are within normal limits. Cerebral volume is normal. No midline shift, ventriculomegaly, mass effect, evidence of mass lesion, intracranial hemorrhage or evidence of cortically based acute infarction. Gray-white matter differentiation is within normal limits throughout the brain. Dominant appearing distal left vertebral artery. No suspicious intracranial vascular hyperdensity. IMPRESSION: Normal noncontrast CT appearance  of the brain. Electronically Signed: By: Odessa Fleming M.D. On: 09/09/2015 09:32   Mr Shirlee Latch Wo Contrast  09/09/2015  CLINICAL DATA:  35 year old female awoke with pain in right neck and subsequently experienced dizziness and fall. Subsequent encounter. EXAM: MRI HEAD WITHOUT AND WITH CONTRAST MRA HEAD WITHOUT CONTRAST MRA NECK WITHOUT AND WITH CONTRAST TECHNIQUE: Multiplanar, multiecho pulse sequences of the brain and surrounding structures were obtained without and with intravenous contrast. Angiographic images of the Circle of Willis were obtained using MRA technique without intravenous contrast. Angiographic images of the neck were obtained using MRA technique without and with intravenous contrast. Carotid stenosis measurements (when applicable) are obtained utilizing NASCET criteria, using the distal internal carotid diameter as the denominator. CONTRAST:  13mL MULTIHANCE GADOBENATE DIMEGLUMINE 529 MG/ML IV SOLN COMPARISON:  None. FINDINGS: MRI HEAD FINDINGS Acute small nonhemorrhagic infarct inferior right cerebellum (measuring up to 1 cm). No intracranial mass or abnormal enhancement. No hydrocephalus. Slightly heterogeneous bone marrow may be normal for this patient. Correlation with CBC to exclude anemia contributing to this appearance may be considered. Cervical medullary junction, pituitary region, pineal region and orbital structures unremarkable. MRA HEAD FINDINGS Right vertebral artery ends in a posterior inferior cerebellar artery distribution. The right vertebral artery appears irregular and narrowed. Question fibromuscular dysplasia with dissection. Ectatic patent left vertebral artery without significant narrowing or regularity. Mildly ectatic basilar artery without significant narrowing or regularity. Nonvisualized anterior inferior cerebellar arteries. Slight irregularity superior cerebellar arteries. Minimal narrowing proximal left posterior cerebral artery. Anterior circulation without medium or  large size vessel significant stenosis or occlusion. Ectatic cavernous segment internal carotid arteries greater on left. Two tiny bulges distal M1 segment left middle cerebral artery/middle cerebral artery bifurcation appear to be origin of vessels rather than small aneurysm. Mild irregularity middle cerebral artery branch vessels may be related to limitations of the present exam. MRA NECK FINDINGS Left vertebral artery arises directly from the aortic arch. Narrowing versus artifact proximal left common carotid artery, left vertebral artery and left subclavian artery. Right vertebral artery is small with slight irregularity distal aspect raises possibility of dissection and possibly of fibromuscular dysplasia. Dominant left vertebral artery is mildly ectatic. No significant stenosis of either carotid bifurcation or either internal carotid artery. IMPRESSION: MRI HEAD Acute small nonhemorrhagic infarct inferior right cerebellum (measuring up to 1 cm). Slightly heterogeneous bone marrow may be normal for this patient. Correlation with CBC to exclude anemia contributing to this appearance may be considered. MRA HEAD Right vertebral artery ends in a posterior inferior cerebellar artery distribution. The right vertebral artery appears irregular and narrowed. Question fibromuscular dysplasia with dissection. Nonvisualized anterior inferior cerebellar arteries. Slight irregularity superior cerebellar arteries. Minimal narrowing proximal left posterior cerebral artery. Ectatic cavernous segment internal carotid arteries greater  on left. Two tiny bulges distal M1 segment left middle cerebral artery/middle cerebral artery bifurcation appear to be origin of vessels rather than small aneurysms. Mild irregularity middle cerebral artery branch vessels may be related to limitations of the present exam. MRA NECK FINDINGS Left vertebral artery arises directly from the aortic arch. Narrowing versus artifact proximal left common  carotid artery, left vertebral artery and left subclavian artery. Right vertebral artery is small with slight irregularity distal aspect raises possibility of dissection and possibly of fibromuscular dysplasia. Dominant left vertebral artery is mildly ectatic. No significant stenosis of either carotid bifurcation or either internal carotid artery. These results were called by telephone at the time of interpretation on 09/09/2015 at 12:38 pm to Dr. Ritta Slot , who verbally acknowledged these results. Electronically Signed   By: Lacy Duverney M.D.   On: 09/09/2015 13:05   Mr Angiogram Neck W Wo Contrast  09/09/2015  CLINICAL DATA:  35 year old female awoke with pain in right neck and subsequently experienced dizziness and fall. Subsequent encounter. EXAM: MRI HEAD WITHOUT AND WITH CONTRAST MRA HEAD WITHOUT CONTRAST MRA NECK WITHOUT AND WITH CONTRAST TECHNIQUE: Multiplanar, multiecho pulse sequences of the brain and surrounding structures were obtained without and with intravenous contrast. Angiographic images of the Circle of Willis were obtained using MRA technique without intravenous contrast. Angiographic images of the neck were obtained using MRA technique without and with intravenous contrast. Carotid stenosis measurements (when applicable) are obtained utilizing NASCET criteria, using the distal internal carotid diameter as the denominator. CONTRAST:  13mL MULTIHANCE GADOBENATE DIMEGLUMINE 529 MG/ML IV SOLN COMPARISON:  None. FINDINGS: MRI HEAD FINDINGS Acute small nonhemorrhagic infarct inferior right cerebellum (measuring up to 1 cm). No intracranial mass or abnormal enhancement. No hydrocephalus. Slightly heterogeneous bone marrow may be normal for this patient. Correlation with CBC to exclude anemia contributing to this appearance may be considered. Cervical medullary junction, pituitary region, pineal region and orbital structures unremarkable. MRA HEAD FINDINGS Right vertebral artery ends in a  posterior inferior cerebellar artery distribution. The right vertebral artery appears irregular and narrowed. Question fibromuscular dysplasia with dissection. Ectatic patent left vertebral artery without significant narrowing or regularity. Mildly ectatic basilar artery without significant narrowing or regularity. Nonvisualized anterior inferior cerebellar arteries. Slight irregularity superior cerebellar arteries. Minimal narrowing proximal left posterior cerebral artery. Anterior circulation without medium or large size vessel significant stenosis or occlusion. Ectatic cavernous segment internal carotid arteries greater on left. Two tiny bulges distal M1 segment left middle cerebral artery/middle cerebral artery bifurcation appear to be origin of vessels rather than small aneurysm. Mild irregularity middle cerebral artery branch vessels may be related to limitations of the present exam. MRA NECK FINDINGS Left vertebral artery arises directly from the aortic arch. Narrowing versus artifact proximal left common carotid artery, left vertebral artery and left subclavian artery. Right vertebral artery is small with slight irregularity distal aspect raises possibility of dissection and possibly of fibromuscular dysplasia. Dominant left vertebral artery is mildly ectatic. No significant stenosis of either carotid bifurcation or either internal carotid artery. IMPRESSION: MRI HEAD Acute small nonhemorrhagic infarct inferior right cerebellum (measuring up to 1 cm). Slightly heterogeneous bone marrow may be normal for this patient. Correlation with CBC to exclude anemia contributing to this appearance may be considered. MRA HEAD Right vertebral artery ends in a posterior inferior cerebellar artery distribution. The right vertebral artery appears irregular and narrowed. Question fibromuscular dysplasia with dissection. Nonvisualized anterior inferior cerebellar arteries. Slight irregularity superior cerebellar arteries.  Minimal narrowing proximal left  posterior cerebral artery. Ectatic cavernous segment internal carotid arteries greater on left. Two tiny bulges distal M1 segment left middle cerebral artery/middle cerebral artery bifurcation appear to be origin of vessels rather than small aneurysms. Mild irregularity middle cerebral artery branch vessels may be related to limitations of the present exam. MRA NECK FINDINGS Left vertebral artery arises directly from the aortic arch. Narrowing versus artifact proximal left common carotid artery, left vertebral artery and left subclavian artery. Right vertebral artery is small with slight irregularity distal aspect raises possibility of dissection and possibly of fibromuscular dysplasia. Dominant left vertebral artery is mildly ectatic. No significant stenosis of either carotid bifurcation or either internal carotid artery. These results were called by telephone at the time of interpretation on 09/09/2015 at 12:38 pm to Dr. Ritta Slot , who verbally acknowledged these results. Electronically Signed   By: Lacy Duverney M.D.   On: 09/09/2015 13:05   Mr Laqueta Jean RU Contrast  09/09/2015  CLINICAL DATA:  35 year old female awoke with pain in right neck and subsequently experienced dizziness and fall. Subsequent encounter. EXAM: MRI HEAD WITHOUT AND WITH CONTRAST MRA HEAD WITHOUT CONTRAST MRA NECK WITHOUT AND WITH CONTRAST TECHNIQUE: Multiplanar, multiecho pulse sequences of the brain and surrounding structures were obtained without and with intravenous contrast. Angiographic images of the Circle of Willis were obtained using MRA technique without intravenous contrast. Angiographic images of the neck were obtained using MRA technique without and with intravenous contrast. Carotid stenosis measurements (when applicable) are obtained utilizing NASCET criteria, using the distal internal carotid diameter as the denominator. CONTRAST:  13mL MULTIHANCE GADOBENATE DIMEGLUMINE 529 MG/ML IV  SOLN COMPARISON:  None. FINDINGS: MRI HEAD FINDINGS Acute small nonhemorrhagic infarct inferior right cerebellum (measuring up to 1 cm). No intracranial mass or abnormal enhancement. No hydrocephalus. Slightly heterogeneous bone marrow may be normal for this patient. Correlation with CBC to exclude anemia contributing to this appearance may be considered. Cervical medullary junction, pituitary region, pineal region and orbital structures unremarkable. MRA HEAD FINDINGS Right vertebral artery ends in a posterior inferior cerebellar artery distribution. The right vertebral artery appears irregular and narrowed. Question fibromuscular dysplasia with dissection. Ectatic patent left vertebral artery without significant narrowing or regularity. Mildly ectatic basilar artery without significant narrowing or regularity. Nonvisualized anterior inferior cerebellar arteries. Slight irregularity superior cerebellar arteries. Minimal narrowing proximal left posterior cerebral artery. Anterior circulation without medium or large size vessel significant stenosis or occlusion. Ectatic cavernous segment internal carotid arteries greater on left. Two tiny bulges distal M1 segment left middle cerebral artery/middle cerebral artery bifurcation appear to be origin of vessels rather than small aneurysm. Mild irregularity middle cerebral artery branch vessels may be related to limitations of the present exam. MRA NECK FINDINGS Left vertebral artery arises directly from the aortic arch. Narrowing versus artifact proximal left common carotid artery, left vertebral artery and left subclavian artery. Right vertebral artery is small with slight irregularity distal aspect raises possibility of dissection and possibly of fibromuscular dysplasia. Dominant left vertebral artery is mildly ectatic. No significant stenosis of either carotid bifurcation or either internal carotid artery. IMPRESSION: MRI HEAD Acute small nonhemorrhagic infarct inferior  right cerebellum (measuring up to 1 cm). Slightly heterogeneous bone marrow may be normal for this patient. Correlation with CBC to exclude anemia contributing to this appearance may be considered. MRA HEAD Right vertebral artery ends in a posterior inferior cerebellar artery distribution. The right vertebral artery appears irregular and narrowed. Question fibromuscular dysplasia with dissection. Nonvisualized anterior inferior cerebellar arteries. Slight  irregularity superior cerebellar arteries. Minimal narrowing proximal left posterior cerebral artery. Ectatic cavernous segment internal carotid arteries greater on left. Two tiny bulges distal M1 segment left middle cerebral artery/middle cerebral artery bifurcation appear to be origin of vessels rather than small aneurysms. Mild irregularity middle cerebral artery branch vessels may be related to limitations of the present exam. MRA NECK FINDINGS Left vertebral artery arises directly from the aortic arch. Narrowing versus artifact proximal left common carotid artery, left vertebral artery and left subclavian artery. Right vertebral artery is small with slight irregularity distal aspect raises possibility of dissection and possibly of fibromuscular dysplasia. Dominant left vertebral artery is mildly ectatic. No significant stenosis of either carotid bifurcation or either internal carotid artery. These results were called by telephone at the time of interpretation on 09/09/2015 at 12:38 pm to Dr. Ritta Slot , who verbally acknowledged these results. Electronically Signed   By: Lacy Duverney M.D.   On: 09/09/2015 13:05   I have personally reviewed and evaluated these images and lab results as part of my medical decision-making.   EKG Interpretation   Date/Time:  Thursday September 09 2015 08:58:26 EST Ventricular Rate:  103 PR Interval:  145 QRS Duration: 90 QT Interval:  333 QTC Calculation: 436 R Axis:   66 Text Interpretation:  Sinus  tachycardia LAE, consider biatrial enlargement  Abnormal R-wave progression, early transition Confirmed by Jeanifer Halliday  MD,  Mahina Salatino ([id number]) on 09/09/2015 9:55:26 AM      MDM   Final diagnoses:  Cerebral infarction due to unspecified mechanism    The new-onset weakness and dizziness concern for possible CVA. Patient may also possibly have vertebral dissection. Neurology at bedside is evaluated the patient. Will get CT MRI and MRA. Recommend discharge home if negative with treatment for complicated migraine.  Evidence of evidence of stroke on MRI. Neurology at bedside. We'll admit to triad.  Loren Racer, MD 09/09/15 1336

## 2015-09-09 NOTE — ED Notes (Signed)
Pt went to the gym this morning after eating breakfast, felt lightheaded (as if room was spinning) and as if "everything was leaning to the right." Pt able to workout, but fell after showering this morning, unsure of LOC. Pt difficulty getting up from floor and difficulty squeezing right hand. Pt c/o pain on right side of neck. Pt hx this type of neck pain w/ migraines. Hx hypothyroidism. Pt denies any other medical hx.

## 2015-09-09 NOTE — ED Notes (Signed)
Husband states that pt could not squeeze with right hand, could not stand on right leg -- still feels tingling per patient.

## 2015-09-09 NOTE — Consult Note (Addendum)
Neurology Consultation Reason for Consult: Right-sided weakness Referring Physician: Ranae Palms, D  CC: Right-sided weakness  History is obtained from: Patient  HPI: Teresa Guzman is a 35 y.o. female who awoke normally this morning, got her kids ready for school. She was waiting for the gym to open around 5:45 AM when she began to experience dizziness, and noticed that she was leaning to the right. She continued with her workout and went home and was taking a shower when she fell. She continued to have symptoms and therefore sought further therapy in the emergency room.  She states that she awoke with a "crick in her neck." This is been persistent and now she has some head pain in the posterior right side. He does get migraines with aura from time to time, the or typically consisting of spots in her vision.  LKW: 5:45 AM tpa given?: no, mild symptoms Premorbid modified rankin scale: 0    ROS: A 14 point ROS was performed and is negative except as noted in the HPI.   Past Medical History  Diagnosis Date  . Endometriosis   . Hypothyroidism      Family History  Problem Relation Age of Onset  . Cancer Brother 46    colon cancer  . Hyperlipidemia Mother   . Hypertension Father   . Hyperlipidemia Father   . Heart attack Father   . Diabetes Father      Social History:  reports that she has never smoked. She does not have any smokeless tobacco history on file. She reports that she does not drink alcohol or use illicit drugs.   Exam: Current vital signs: BP 126/73 mmHg  Pulse 108  Temp(Src) 97.6 F (36.4 C) (Oral)  Resp 17  SpO2 98%  LMP 08/22/2015 Vital signs in last 24 hours: Temp:  [97.6 F (36.4 C)] 97.6 F (36.4 C) (02/02 0907) Pulse Rate:  [108] 108 (02/02 0907) Resp:  [17] 17 (02/02 0907) BP: (126)/(73) 126/73 mmHg (02/02 0907) SpO2:  [98 %] 98 % (02/02 0907)   Physical Exam  Constitutional: Appears well-developed and well-nourished.  Psych: Affect  appropriate to situation Eyes: No scleral injection HENT: No OP obstrucion Head: Normocephalic.  Cardiovascular: Normal rate and regular rhythm.  Respiratory: Effort normal and breath sounds normal to anterior ascultation GI: Soft.  No distension. There is no tenderness.  Skin: WDI  Neuro: Mental Status: Patient is awake, alert, oriented to person, place, month, year, and situation. Patient is able to give a clear and coherent history. No signs of aphasia or neglect Cranial Nerves: II: Visual Fields are full. Pupils are equal, round, and reactive to light.   III,IV, VI: EOMI without ptosis or diploplia.  V: Facial sensation is symmetric to temperature VII: Facial movement is symmetric.  VIII: hearing is intact to voice X: Uvula elevates symmetrically XI: Shoulder shrug is symmetric. XII: tongue is midline without atrophy or fasciculations.  Motor: Tone is normal. Bulk is normal. She did not have any drift of her right arm, she does have mild drift in her right leg. She has 4+/5 strength in both arm and leg. She has marked tremulousness of both. Sensory: Sensation is symmetric to light touch and temperature in the arms and legs. Cerebellar: FNF and HKS are tremulous, but without clear pass pointing.  I have reviewed labs in epic and the results pertinent to this consultation are: Creatinine 1.2  I have reviewed the images obtained: CT head-negative  Impression: 35 year old female with a history  of migraines who presents with mild right-sided weakness and dysequilibrium. Symptoms are relatively mild and therefore I would not recommend IV TPA at this time. Possibilities include complex migraine versus vertebral dissection with ischemic infarct. I would favor performing MRI/MRA, however this is negative and I would not pursue any further workup at this time. If it is positive then  admit for further stroke evaluation.  Recommendations: 1) MRI of the brain, MRA of the head and  neck 2) if positive, then we'll treat for stroke, otherwise would treat as complex migraine.   Ritta Slot, MD [facility] [phone]  If 7pm- 7am, please page neurology on call as listed in [facility]ION.   MRI shows likely vertebral dissection with small ischemic infarct. She will need to be admitted for PT,OT. She has mild renal insufficiency, and unsure if this is AKI. I would favor further imaging with CTA to get a better look at it, but would favor hydration first. She will be treated with antiplatelet therapy.   1) ASA, Plavix 2) a1c, LDL 3) echo 4) no need for dopplers 5) telemetry 6) consider CTA if Cr improves in the AM.  7) PT,OT 8) Stroke team to follow.    Ritta Slot, MD [facility] [phone]  If 7pm- 7am, please page neurology on call as listed in [facility]ION.

## 2015-09-09 NOTE — H&P (Signed)
[facility] History and Physical                                                                                    Teresa Guzman, is a 35 y.o. female  MRN: [id number]   DOB - 1980/11/17  Admit Date - 09/09/2015  Outpatient Primary MD for the patient is [name], MD  Referring MD: Ranae Palms / ER  Consulting M.D: Amada Jupiter / Neurology  PMH: Past Medical History  Diagnosis Date  . Endometriosis   . Hypothyroidism       PSH: Past Surgical History  Procedure Laterality Date  . Arm surgery Left 1987  . Cyst removal hand       CC:  Chief Complaint  Patient presents with  . Dizziness  . stroke like symptoms      HPI: This is a 35 yo female patient with past medical history of anemia in setting of known endometriosis, hypothyroidism and asthma. Patient presented to the ER with complaints of falling to the right and right side weakness. Symptoms began immediately after working out today. She was also noted with gait ataxia and disequilibrium syndrome. While taking a shower she apparently fell. Recently she's had sinus infection symptomatology with sinus pressure and headache and was treated with antibiotics about 2 weeks ago. She awoke this morning with what she describes as a "crick in her neck" on the right side. She reports that since childhood every time she has a migraine she has a similar pain in her neck. She has not had migraine symptomatology recently. Upon my evaluation right-sided symptomatology had resolved but she has not gotten out of bed from the stretcher to check for any ataxia or disequilibrium.  ER Evaluation and treatment: Afebrile, hemodynamically stable, room air saturations 98-100%. CT of the head without contrast: Unremarkable MRI and MRA brain without contrast: Acute small nonhemorrhagic infarct inferior right cerebellum measuring up to 1 cm; right vertebral artery irregular in appearance and narrowed with possibility of underlying fibromuscular  dysplasia with dissection EKG: Sinus tachycardia with ventricular rate in 103 bpm, QTC 436 ms, no acute ischemic changes  Laboratory data: Troponin 0.00, BUN 14 creatinine 1.04-repeat i-STAT electrolyte panel creatinine of 1.2, WBCs 11,100 with neutrophils 84% and absolute neutrophils 9.2%, PT/ INR & PTT normal Normal saline IV bolus 1000 mL Aspirin 325,000,000 g by mouth 1 Plavix 85 mg by mouth 1  Review of Systems   In addition to the HPI above,  No Fever-chills, myalgias or other constitutional symptoms No Headache, changes with Vision or hearing No problems swallowing food or Liquids, indigestion/reflux No Chest pain, Cough or Shortness of Breath, palpitations, orthopnea or DOE No Abdominal pain, N/V; no melena or hematochezia, no dark tarry stools, Bowel movements are regular, No dysuria, hematuria or flank pain No new skin rashes, lesions, masses or bruises, No new joints pains-aches No recent weight gain or loss No polyuria, polydypsia or polyphagia,  *A full 10 point Review of Systems was done, except as stated above, all other Review of Systems were negative.  Social History Social History  Substance Use Topics  . Smoking status: Never Smoker   . Smokeless tobacco: Not  on file  . Alcohol Use: No     Comment: occ.    Resides at: Private residence  Lives with: Spouse  Ambulatory status: Without assistive devices prior to admission   Family History Family History  Problem Relation Age of Onset  . Cancer Brother 107    colon cancer  . Hyperlipidemia Mother   . Hypertension Father   . Hyperlipidemia Father   . Heart attack Father   . Diabetes Father      Prior to Admission medications   Medication Sig Start Date End Date Taking? Authorizing Provider  Azelastine HCl 0.15 % SOLN use 2 sprays in each  nostril two times daily 06/05/14  Yes Sheliah Hatch, MD  fluticasone [facility]) 50 MCG/ACT nasal spray USE 1 TO 2 SPRAYS INTO THE  NOSE DAILY 03/04/15  Yes  Sheliah Hatch, MD  fluticasone (FLOVENT HFA) 110 MCG/ACT inhaler Inhale 1 puff into the lungs 2 (two) times daily. 05/06/15  Yes Sheliah Hatch, MD  levothyroxine (SYNTHROID, LEVOTHROID) 100 MCG tablet Take 1 tablet (100 mcg total) by mouth daily. 07/21/15  Yes Sheliah Hatch, MD  loratadine-pseudoephedrine (CLARITIN-D 12-HOUR) 5-120 MG tablet Take 1 tablet by mouth 2 (two) times daily.   Yes Historical Provider, MD  montelukast (SINGULAIR) 10 MG tablet Take 1 tablet by mouth at  bedtime 12/30/14  Yes Sheliah Hatch, MD  Multiple Vitamin (MULTIVITAMIN WITH MINERALS) TABS tablet Take 1 tablet by mouth daily.   Yes Historical Provider, MD  saccharomyces boulardii (FLORASTOR) 250 MG capsule Take 250 mg by mouth 2 (two) times daily.   Yes Historical Provider, MD  TURMERIC PO Take 1 Dose by mouth daily.   Yes Historical Provider, MD  UNABLE TO FIND Take 1 Dose by mouth daily as needed (pre-workout). C4 Pre Workout Powder: Vitamin C (as Ascorbic Acid), Niacin (as Niacinamide), Vitamin B6 (as Pyridoxal-5-Phosphate), Folic Acid, Vitamin B12 (as Methylcobalamin), Calcium, Beta Alanine, Creatine Nitrate, Arginine AKG, Explosive Energy Blend, N-Acetyl-L-tyrosine, Caffeine Anhydrous ( ), Velvet Bean (Mucuna pruriens) seed extract, (standardized for L-Dopa), TeaCor Tetramethyluric Acid   Yes Historical Provider, MD  Vitamin D, Ergocalciferol, (DRISDOL) 50000 UNITS CAPS capsule Take 1 capsule (50,000 Units total) by mouth every 7 (seven) days. 07/21/15  Yes Sheliah Hatch, MD    No Known Allergies  Physical Exam  Vitals  Blood pressure 118/65, pulse 97, temperature 97.6 F (36.4 C), temperature source Oral, resp. rate 15, last menstrual period 08/22/2015, SpO2 100 %.   General:  In no acute distress, appears healthy and well nourished  Psych:  Anxious affect, tearful, Denies Suicidal or Homicidal ideations, Awake Alert, Oriented X 3. Speech and thought patterns are clear and  appropriate, no apparent short term memory deficits  Neuro:   No focal neurological deficits, CN II through XII intact, Strength 5/5 all 4 extremities, Sensation intact all 4 extremities.  ENT:  Ears and Eyes appear Normal, Conjunctivae clear, PER. Moist oral mucosa without erythema or exudates.  Neck:  Supple, No lymphadenopathy appreciated  Respiratory:  Symmetrical chest wall movement, Good air movement bilaterally, CTAB. Room Air  Cardiac:  RRR, No Murmurs, no LE edema noted, no JVD, No carotid bruits, peripheral pulses palpable at 2+  Abdomen:  Positive bowel sounds, Soft, Non tender, Non distended,  No masses appreciated, no obvious hepatosplenomegaly  Skin:  No Cyanosis, Normal Skin Turgor, No Skin Rash or Bruise.  Extremities: Symmetrical without obvious trauma or injury,  no effusions.  Data Review  CBC  Recent  Labs Lab 09/09/15 0909 09/09/15 0916  WBC 11.1*  --   HGB 13.1 14.6  HCT 37.9 43.0  PLT 297  --   MCV 87.1  --   MCH 30.1  --   MCHC 34.6  --   RDW 13.2  --   LYMPHSABS 1.0  --   MONOABS 0.8  --   EOSABS 0.0  --   BASOSABS 0.0  --     Chemistries   Recent Labs Lab 09/09/15 0909 09/09/15 0916  NA 136 136  K 3.8 3.7  CL 100* 99*  CO2 24  --   GLUCOSE 79 75  BUN 14 19  CREATININE 1.04* 1.20*  CALCIUM 9.4  --   AST 25  --   ALT 11*  --   ALKPHOS 58  --   BILITOT 0.7  --     CrCl cannot be calculated (Unknown ideal weight.).  No results for input(s): TSH, T4TOTAL, T3FREE, THYROIDAB in the last 72 hours.  Invalid input(s): FREET3  Coagulation profile  Recent Labs Lab 09/09/15 0909  INR 1.08    No results for input(s): DDIMER in the last 72 hours.  Cardiac Enzymes No results for input(s): CKMB, TROPONINI, MYOGLOBIN in the last 168 hours.  Invalid input(s): CK  Invalid input(s): POCBNP  Urinalysis    Component Value Date/Time   COLORURINE yellow 05/06/2010 1438   APPEARANCEUR Clear 05/06/2010 1438   LABSPEC 1.010  05/06/2010 1438   PHURINE 6.5 05/06/2010 1438   HGBUR negative 05/06/2010 1438   BILIRUBINUR negative 05/06/2010 1438   UROBILINOGEN 0.2 05/06/2010 1438   NITRITE negative 05/06/2010 1438    Imaging results:   Ct Head Wo Contrast  09/09/2015  ADDENDUM REPORT: 09/09/2015 09:41 ADDENDUM: Study discussed by telephone with Dr. Ritta Slot on 09/09/2015 at 0935 hours. Electronically Signed   By: Odessa Fleming M.D.   On: 09/09/2015 09:41  09/09/2015  CLINICAL DATA:  35 year old female code stroke. Acute onset dizziness and leaning to the right this morning. Initial encounter. EXAM: CT HEAD WITHOUT CONTRAST TECHNIQUE: Contiguous axial images were obtained from the base of the skull through the vertex without intravenous contrast. COMPARISON:  None. FINDINGS: Visualized paranasal sinuses and mastoids are clear. No osseous abnormality identified. Visualized orbits and scalp soft tissues are within normal limits. Cerebral volume is normal. No midline shift, ventriculomegaly, mass effect, evidence of mass lesion, intracranial hemorrhage or evidence of cortically based acute infarction. Gray-white matter differentiation is within normal limits throughout the brain. Dominant appearing distal left vertebral artery. No suspicious intracranial vascular hyperdensity. IMPRESSION: Normal noncontrast CT appearance of the brain. Electronically Signed: By: Odessa Fleming M.D. On: 09/09/2015 09:32   Mr Shirlee Latch Wo Contrast  09/09/2015  CLINICAL DATA:  35 year old female awoke with pain in right neck and subsequently experienced dizziness and fall. Subsequent encounter. EXAM: MRI HEAD WITHOUT AND WITH CONTRAST MRA HEAD WITHOUT CONTRAST MRA NECK WITHOUT AND WITH CONTRAST TECHNIQUE: Multiplanar, multiecho pulse sequences of the brain and surrounding structures were obtained without and with intravenous contrast. Angiographic images of the Circle of Willis were obtained using MRA technique without intravenous contrast. Angiographic  images of the neck were obtained using MRA technique without and with intravenous contrast. Carotid stenosis measurements (when applicable) are obtained utilizing NASCET criteria, using the distal internal carotid diameter as the denominator. CONTRAST:  13mL MULTIHANCE GADOBENATE DIMEGLUMINE 529 MG/ML IV SOLN COMPARISON:  None. FINDINGS: MRI HEAD FINDINGS Acute small nonhemorrhagic infarct inferior right cerebellum (measuring up to 1  cm). No intracranial mass or abnormal enhancement. No hydrocephalus. Slightly heterogeneous bone marrow may be normal for this patient. Correlation with CBC to exclude anemia contributing to this appearance may be considered. Cervical medullary junction, pituitary region, pineal region and orbital structures unremarkable. MRA HEAD FINDINGS Right vertebral artery ends in a posterior inferior cerebellar artery distribution. The right vertebral artery appears irregular and narrowed. Question fibromuscular dysplasia with dissection. Ectatic patent left vertebral artery without significant narrowing or regularity. Mildly ectatic basilar artery without significant narrowing or regularity. Nonvisualized anterior inferior cerebellar arteries. Slight irregularity superior cerebellar arteries. Minimal narrowing proximal left posterior cerebral artery. Anterior circulation without medium or large size vessel significant stenosis or occlusion. Ectatic cavernous segment internal carotid arteries greater on left. Two tiny bulges distal M1 segment left middle cerebral artery/middle cerebral artery bifurcation appear to be origin of vessels rather than small aneurysm. Mild irregularity middle cerebral artery branch vessels may be related to limitations of the present exam. MRA NECK FINDINGS Left vertebral artery arises directly from the aortic arch. Narrowing versus artifact proximal left common carotid artery, left vertebral artery and left subclavian artery. Right vertebral artery is small with  slight irregularity distal aspect raises possibility of dissection and possibly of fibromuscular dysplasia. Dominant left vertebral artery is mildly ectatic. No significant stenosis of either carotid bifurcation or either internal carotid artery. IMPRESSION: MRI HEAD Acute small nonhemorrhagic infarct inferior right cerebellum (measuring up to 1 cm). Slightly heterogeneous bone marrow may be normal for this patient. Correlation with CBC to exclude anemia contributing to this appearance may be considered. MRA HEAD Right vertebral artery ends in a posterior inferior cerebellar artery distribution. The right vertebral artery appears irregular and narrowed. Question fibromuscular dysplasia with dissection. Nonvisualized anterior inferior cerebellar arteries. Slight irregularity superior cerebellar arteries. Minimal narrowing proximal left posterior cerebral artery. Ectatic cavernous segment internal carotid arteries greater on left. Two tiny bulges distal M1 segment left middle cerebral artery/middle cerebral artery bifurcation appear to be origin of vessels rather than small aneurysms. Mild irregularity middle cerebral artery branch vessels may be related to limitations of the present exam. MRA NECK FINDINGS Left vertebral artery arises directly from the aortic arch. Narrowing versus artifact proximal left common carotid artery, left vertebral artery and left subclavian artery. Right vertebral artery is small with slight irregularity distal aspect raises possibility of dissection and possibly of fibromuscular dysplasia. Dominant left vertebral artery is mildly ectatic. No significant stenosis of either carotid bifurcation or either internal carotid artery. These results were called by telephone at the time of interpretation on 09/09/2015 at 12:38 pm to Dr. Ritta Slot , who verbally acknowledged these results. Electronically Signed   By: Lacy Duverney M.D.   On: 09/09/2015 13:05   Mr Angiogram Neck W Wo  Contrast  09/09/2015  CLINICAL DATA:  35 year old female awoke with pain in right neck and subsequently experienced dizziness and fall. Subsequent encounter. EXAM: MRI HEAD WITHOUT AND WITH CONTRAST MRA HEAD WITHOUT CONTRAST MRA NECK WITHOUT AND WITH CONTRAST TECHNIQUE: Multiplanar, multiecho pulse sequences of the brain and surrounding structures were obtained without and with intravenous contrast. Angiographic images of the Circle of Willis were obtained using MRA technique without intravenous contrast. Angiographic images of the neck were obtained using MRA technique without and with intravenous contrast. Carotid stenosis measurements (when applicable) are obtained utilizing NASCET criteria, using the distal internal carotid diameter as the denominator. CONTRAST:  13mL MULTIHANCE GADOBENATE DIMEGLUMINE 529 MG/ML IV SOLN COMPARISON:  None. FINDINGS: MRI HEAD FINDINGS Acute small  nonhemorrhagic infarct inferior right cerebellum (measuring up to 1 cm). No intracranial mass or abnormal enhancement. No hydrocephalus. Slightly heterogeneous bone marrow may be normal for this patient. Correlation with CBC to exclude anemia contributing to this appearance may be considered. Cervical medullary junction, pituitary region, pineal region and orbital structures unremarkable. MRA HEAD FINDINGS Right vertebral artery ends in a posterior inferior cerebellar artery distribution. The right vertebral artery appears irregular and narrowed. Question fibromuscular dysplasia with dissection. Ectatic patent left vertebral artery without significant narrowing or regularity. Mildly ectatic basilar artery without significant narrowing or regularity. Nonvisualized anterior inferior cerebellar arteries. Slight irregularity superior cerebellar arteries. Minimal narrowing proximal left posterior cerebral artery. Anterior circulation without medium or large size vessel significant stenosis or occlusion. Ectatic cavernous segment internal  carotid arteries greater on left. Two tiny bulges distal M1 segment left middle cerebral artery/middle cerebral artery bifurcation appear to be origin of vessels rather than small aneurysm. Mild irregularity middle cerebral artery branch vessels may be related to limitations of the present exam. MRA NECK FINDINGS Left vertebral artery arises directly from the aortic arch. Narrowing versus artifact proximal left common carotid artery, left vertebral artery and left subclavian artery. Right vertebral artery is small with slight irregularity distal aspect raises possibility of dissection and possibly of fibromuscular dysplasia. Dominant left vertebral artery is mildly ectatic. No significant stenosis of either carotid bifurcation or either internal carotid artery. IMPRESSION: MRI HEAD Acute small nonhemorrhagic infarct inferior right cerebellum (measuring up to 1 cm). Slightly heterogeneous bone marrow may be normal for this patient. Correlation with CBC to exclude anemia contributing to this appearance may be considered. MRA HEAD Right vertebral artery ends in a posterior inferior cerebellar artery distribution. The right vertebral artery appears irregular and narrowed. Question fibromuscular dysplasia with dissection. Nonvisualized anterior inferior cerebellar arteries. Slight irregularity superior cerebellar arteries. Minimal narrowing proximal left posterior cerebral artery. Ectatic cavernous segment internal carotid arteries greater on left. Two tiny bulges distal M1 segment left middle cerebral artery/middle cerebral artery bifurcation appear to be origin of vessels rather than small aneurysms. Mild irregularity middle cerebral artery branch vessels may be related to limitations of the present exam. MRA NECK FINDINGS Left vertebral artery arises directly from the aortic arch. Narrowing versus artifact proximal left common carotid artery, left vertebral artery and left subclavian artery. Right vertebral artery is  small with slight irregularity distal aspect raises possibility of dissection and possibly of fibromuscular dysplasia. Dominant left vertebral artery is mildly ectatic. No significant stenosis of either carotid bifurcation or either internal carotid artery. These results were called by telephone at the time of interpretation on 09/09/2015 at 12:38 pm to Dr. Ritta Slot , who verbally acknowledged these results. Electronically Signed   By: Lacy Duverney M.D.   On: 09/09/2015 13:05   Mr Laqueta Jean WU Contrast  09/09/2015  CLINICAL DATA:  35 year old female awoke with pain in right neck and subsequently experienced dizziness and fall. Subsequent encounter. EXAM: MRI HEAD WITHOUT AND WITH CONTRAST MRA HEAD WITHOUT CONTRAST MRA NECK WITHOUT AND WITH CONTRAST TECHNIQUE: Multiplanar, multiecho pulse sequences of the brain and surrounding structures were obtained without and with intravenous contrast. Angiographic images of the Circle of Willis were obtained using MRA technique without intravenous contrast. Angiographic images of the neck were obtained using MRA technique without and with intravenous contrast. Carotid stenosis measurements (when applicable) are obtained utilizing NASCET criteria, using the distal internal carotid diameter as the denominator. CONTRAST:  13mL MULTIHANCE GADOBENATE DIMEGLUMINE 529 MG/ML IV SOLN COMPARISON:  None. FINDINGS: MRI HEAD FINDINGS Acute small nonhemorrhagic infarct inferior right cerebellum (measuring up to 1 cm). No intracranial mass or abnormal enhancement. No hydrocephalus. Slightly heterogeneous bone marrow may be normal for this patient. Correlation with CBC to exclude anemia contributing to this appearance may be considered. Cervical medullary junction, pituitary region, pineal region and orbital structures unremarkable. MRA HEAD FINDINGS Right vertebral artery ends in a posterior inferior cerebellar artery distribution. The right vertebral artery appears irregular and  narrowed. Question fibromuscular dysplasia with dissection. Ectatic patent left vertebral artery without significant narrowing or regularity. Mildly ectatic basilar artery without significant narrowing or regularity. Nonvisualized anterior inferior cerebellar arteries. Slight irregularity superior cerebellar arteries. Minimal narrowing proximal left posterior cerebral artery. Anterior circulation without medium or large size vessel significant stenosis or occlusion. Ectatic cavernous segment internal carotid arteries greater on left. Two tiny bulges distal M1 segment left middle cerebral artery/middle cerebral artery bifurcation appear to be origin of vessels rather than small aneurysm. Mild irregularity middle cerebral artery branch vessels may be related to limitations of the present exam. MRA NECK FINDINGS Left vertebral artery arises directly from the aortic arch. Narrowing versus artifact proximal left common carotid artery, left vertebral artery and left subclavian artery. Right vertebral artery is small with slight irregularity distal aspect raises possibility of dissection and possibly of fibromuscular dysplasia. Dominant left vertebral artery is mildly ectatic. No significant stenosis of either carotid bifurcation or either internal carotid artery. IMPRESSION: MRI HEAD Acute small nonhemorrhagic infarct inferior right cerebellum (measuring up to 1 cm). Slightly heterogeneous bone marrow may be normal for this patient. Correlation with CBC to exclude anemia contributing to this appearance may be considered. MRA HEAD Right vertebral artery ends in a posterior inferior cerebellar artery distribution. The right vertebral artery appears irregular and narrowed. Question fibromuscular dysplasia with dissection. Nonvisualized anterior inferior cerebellar arteries. Slight irregularity superior cerebellar arteries. Minimal narrowing proximal left posterior cerebral artery. Ectatic cavernous segment internal carotid  arteries greater on left. Two tiny bulges distal M1 segment left middle cerebral artery/middle cerebral artery bifurcation appear to be origin of vessels rather than small aneurysms. Mild irregularity middle cerebral artery branch vessels may be related to limitations of the present exam. MRA NECK FINDINGS Left vertebral artery arises directly from the aortic arch. Narrowing versus artifact proximal left common carotid artery, left vertebral artery and left subclavian artery. Right vertebral artery is small with slight irregularity distal aspect raises possibility of dissection and possibly of fibromuscular dysplasia. Dominant left vertebral artery is mildly ectatic. No significant stenosis of either carotid bifurcation or either internal carotid artery. These results were called by telephone at the time of interpretation on 09/09/2015 at 12:38 pm to Dr. Ritta Slot , who verbally acknowledged these results. Electronically Signed   By: Lacy Duverney M.D.   On: 09/09/2015 13:05     EKG: (Independently reviewed)  Sinus tachycardia with ventricular rate in 103 bpm, QTC 436 ms, no acute ischemic changes    Assessment & Plan  Principal Problem:   CVA  -MRI/MRA brain consistent with acute right cerebellar infarct with concerning findings of vertebral artery fibromuscular dysplasia -Admit to telemetry/Obs -Discussed with neurology Dr. Amada Jupiter and will defer carotid duplex in favor of CTA of head and neck once creatinine improved after hydration (ck BMET in am) -DAPT initially with aspirin and Plavix; rounding neurologist to determine long-term goals for therapy -Echocardiogram -ck HgbA1c and lipid panel -PT/OT/SLP evaluation  Active Problems:    AKI  -Current creatinine between 1.4 and  1.2 -This is near baseline of 0.6 and1.2 -Repeat electrolyte panel in a.m. after hydration and consider CTA of head and neck as above -Despite mild elevation in creatinine patient is not hypertensive so this  juncture do not suspect FMD involving the renal arteries    Hypothyroidism -Continue Synthroid    Asthma -Not actively wheezing -Continue preadmission medications    ANEMIA-NOS -Hemoglobin stable and near baseline    DVT Prophylaxis: Lovenox  Family Communication:   Husband at bedside  Code Status:  Full code  Condition:  Stable  Discharge disposition: Anticipate discharge to home environment once stroke evaluation complete; patient will need work note prior to discharge  Time spent in minutes : 60      Evarose Altland L. ANP on 09/09/2015 at 1:59 PM  You may contact me by going to [email or web address] - password [email or web address]  I am available from 7a-7p but please confirm I am on the schedule by going to Amion as above.   After 7p please contact night coverage person covering me after hours  [facility]

## 2015-09-10 ENCOUNTER — Observation Stay (HOSPITAL_COMMUNITY): Payer: BLUE CROSS/BLUE SHIELD

## 2015-09-10 ENCOUNTER — Ambulatory Visit (HOSPITAL_COMMUNITY): Payer: BLUE CROSS/BLUE SHIELD

## 2015-09-10 DIAGNOSIS — I7774 Dissection of vertebral artery: Secondary | ICD-10-CM | POA: Diagnosis present

## 2015-09-10 DIAGNOSIS — I63541 Cerebral infarction due to unspecified occlusion or stenosis of right cerebellar artery: Secondary | ICD-10-CM | POA: Diagnosis present

## 2015-09-10 DIAGNOSIS — Z7951 Long term (current) use of inhaled steroids: Secondary | ICD-10-CM | POA: Diagnosis not present

## 2015-09-10 DIAGNOSIS — N179 Acute kidney failure, unspecified: Secondary | ICD-10-CM | POA: Diagnosis present

## 2015-09-10 DIAGNOSIS — J45909 Unspecified asthma, uncomplicated: Secondary | ICD-10-CM | POA: Diagnosis present

## 2015-09-10 DIAGNOSIS — I63111 Cerebral infarction due to embolism of right vertebral artery: Secondary | ICD-10-CM | POA: Diagnosis not present

## 2015-09-10 DIAGNOSIS — D649 Anemia, unspecified: Secondary | ICD-10-CM | POA: Diagnosis present

## 2015-09-10 DIAGNOSIS — E039 Hypothyroidism, unspecified: Secondary | ICD-10-CM | POA: Diagnosis present

## 2015-09-10 DIAGNOSIS — I6789 Other cerebrovascular disease: Secondary | ICD-10-CM | POA: Diagnosis not present

## 2015-09-10 DIAGNOSIS — G8191 Hemiplegia, unspecified affecting right dominant side: Secondary | ICD-10-CM | POA: Diagnosis present

## 2015-09-10 DIAGNOSIS — I63031 Cerebral infarction due to thrombosis of right carotid artery: Secondary | ICD-10-CM | POA: Diagnosis not present

## 2015-09-10 DIAGNOSIS — R29701 NIHSS score 1: Secondary | ICD-10-CM | POA: Diagnosis present

## 2015-09-10 DIAGNOSIS — Z8249 Family history of ischemic heart disease and other diseases of the circulatory system: Secondary | ICD-10-CM | POA: Diagnosis not present

## 2015-09-10 DIAGNOSIS — N809 Endometriosis, unspecified: Secondary | ICD-10-CM | POA: Diagnosis present

## 2015-09-10 DIAGNOSIS — R760 Raised antibody titer: Secondary | ICD-10-CM | POA: Diagnosis present

## 2015-09-10 DIAGNOSIS — R531 Weakness: Secondary | ICD-10-CM | POA: Diagnosis present

## 2015-09-10 DIAGNOSIS — R260 Ataxic gait: Secondary | ICD-10-CM | POA: Diagnosis present

## 2015-09-10 LAB — LIPID PANEL
Cholesterol: 135 mg/dL (ref 0–200)
HDL: 49 mg/dL (ref >40)
LDL Cholesterol: 76 mg/dL (ref 0–99)
Total CHOL/HDL Ratio: 2.8 RATIO
Triglycerides: 50 mg/dL (ref <150)
VLDL: 10 mg/dL (ref 0–40)

## 2015-09-10 LAB — ANTINUCLEAR ANTIBODIES, IFA: ANA Ab, IFA: POSITIVE — AB

## 2015-09-10 LAB — BASIC METABOLIC PANEL
Anion gap: 9 (ref 5–15)
BUN: 6 mg/dL (ref 6–20)
CO2: 25 mmol/L (ref 22–32)
Calcium: 8.7 mg/dL — ABNORMAL LOW (ref 8.9–10.3)
Chloride: 106 mmol/L (ref 101–111)
Creatinine, Ser: 0.81 mg/dL (ref 0.44–1.00)
GFR calc Af Amer: >60 mL/min (ref >60)
GFR calc non Af Amer: >60 mL/min (ref >60)
Glucose, Bld: 85 mg/dL (ref 65–99)
Potassium: 4.3 mmol/L (ref 3.5–5.1)
Sodium: 140 mmol/L (ref 135–145)

## 2015-09-10 LAB — FANA STAINING PATTERNS: Homogeneous Pattern: 1:80 {titer}

## 2015-09-10 MED ORDER — BUTALBITAL-APAP-CAFFEINE 50-325-40 MG PO TABS
1.0000 | ORAL_TABLET | Freq: Four times a day (QID) | ORAL | Status: DC | PRN
  Administered 2015-09-10 – 2015-09-11 (×3): 1 via ORAL
  Filled 2015-09-10 (×4): qty 1

## 2015-09-10 MED ORDER — ATORVASTATIN CALCIUM 10 MG PO TABS
10.0000 mg | ORAL_TABLET | Freq: Every day | ORAL | Status: DC
  Administered 2015-09-10: 10 mg via ORAL
  Filled 2015-09-10: qty 1

## 2015-09-10 MED ORDER — IOHEXOL 350 MG/ML SOLN
50.0000 mL | Freq: Once | INTRAVENOUS | Status: AC | PRN
  Administered 2015-09-10: 50 mL via INTRAVENOUS

## 2015-09-10 MED ORDER — TRAMADOL HCL 50 MG PO TABS
50.0000 mg | ORAL_TABLET | Freq: Once | ORAL | Status: AC
  Administered 2015-09-10: 50 mg via ORAL
  Filled 2015-09-10: qty 1

## 2015-09-10 MED ORDER — BUTALBITAL-APAP-CAFFEINE 50-325-40 MG PO TABS
1.0000 | ORAL_TABLET | Freq: Two times a day (BID) | ORAL | Status: DC | PRN
  Administered 2015-09-10: 1 via ORAL
  Filled 2015-09-10: qty 1

## 2015-09-10 NOTE — Progress Notes (Signed)
OT Cancellation Note  Patient Details Name: Teresa Guzman MRN: [id number] DOB: September 28, 1980   Cancelled Treatment:    Reason Eval/Treat Not Completed: Patient at procedure or test/ unavailable (CT). Will follow up for OT eval as time allows and pt is appropriate.  Gaye Alken M.S., OTR/L Pager: [phone]   09/10/2015, 11:18 AM

## 2015-09-10 NOTE — Progress Notes (Signed)
PT Cancellation Note  Patient Details Name: CAMBRIA OSTEN MRN: [id number] DOB: Jun 13, 1981   Cancelled Treatment:    Reason Eval/Treat Not Completed: Patient at procedure or test/unavailable   Tashea Othman 09/10/2015, 11:10 AM Pager [phone]

## 2015-09-10 NOTE — Care Management Note (Signed)
Case Management Note  Patient Details  Name: Teresa Guzman MRN: [id number] Date of Birth: 1981/01/13  Subjective/Objective:                    Action/Plan: Patient was admitted with CVA. Lives at home with spouse. Will follow for discharge needs pending PT/OT evals and physician orders.  Expected Discharge Date:                  Expected Discharge Plan:     In-House Referral:     Discharge planning Services     Post Acute Care Choice:    Choice offered to:     DME Arranged:    DME Agency:     HH Arranged:    HH Agency:     Status of Service:  In process, will continue to follow  Medicare Important Message Given:    Date Medicare IM Given:    Medicare IM give by:    Date Additional Medicare IM Given:    Additional Medicare Important Message give by:     If discussed at Long Length of Stay Meetings, dates discussed:    Additional CommentsAnda Kraft, RN 09/10/2015, 11:05 AM [phone]

## 2015-09-10 NOTE — Progress Notes (Signed)
PT Cancellation Note  Patient Details Name: Teresa Guzman MRN: [id number] DOB: 01-27-1981   Cancelled Treatment:    Reason Eval/Treat Not Completed: Patient at procedure or test/unavailable (Physical Therapist's 3 rd attempt to see pt today...has been out of room for testing). Will reattempt 2/4.   Else Habermann 09/10/2015, 3:42 PM  Pager [phone]

## 2015-09-10 NOTE — Progress Notes (Signed)
PATIENT DETAILS Name: Teresa Guzman Age: 35 y.o. Sex: female Date of Birth: 1981/07/20 Admit Date: 09/09/2015 Admitting Physician Henderson Cloud, MD [id number] Beverely Low, MD  Subjective: Feels much better compared to yesterday.  Assessment/Plan: Principal Problem: Acute nonhemorrhagic inferior right cerebellar infarct: Likely secondary to right vertebral artery dissection. May have underlying fibromuscular dysplasia-but dissection could have been precipitated by a vigorous exercise (works out/ [employer]). Continue antiplatelet agents, await further recommendations from neurology. Nonfocal exam, speech clear. Echo pending.  Active Problems: AKI: Resolved with hydration. Likely mild prerenal azotemia.  Hypothyroidism: Continue levothyroxine  History of bronchial asthma: Lungs clear, continue bronchodilators.  Disposition: Remain inpatient  Antimicrobial agents  See below  Anti-infectives    None      DVT Prophylaxis: Prophylactic Lovenox   Code Status: Full code o  Family Communication None  Procedures: None  CONSULTS:  neurology  Time spent 25 minutes-Greater than 50% of this time was spent in counseling, explanation of diagnosis, planning of further management, and coordination of care.  MEDICATIONS: Scheduled Meds: .  stroke: mapping our early stages of recovery book   Does not apply Once  . aspirin EC  325 mg Oral Daily  . budesonide (PULMICORT) nebulizer solution  0.25 mg Nebulization BID  . clopidogrel  75 mg Oral Daily  . enoxaparin (LOVENOX) injection  40 mg Subcutaneous Q24H  . levothyroxine  100 mcg Oral QAC breakfast  . montelukast  10 mg Oral QHS   Continuous Infusions: . sodium chloride 50 mL/hr at 09/09/15 1528   PRN Meds:.acetaminophen, butalbital-acetaminophen-caffeine, ondansetron (ZOFRAN) IV    PHYSICAL EXAM: Vital signs in last 24 hours: Filed Vitals:   09/10/15 0330 09/10/15 0641 09/10/15 1227 09/10/15  1403  BP: 115/61 113/64 121/61 113/61  Pulse: 89 90 92 89  Temp: 98.5 F (36.9 C) 98.1 F (36.7 C) 98.6 F (37 C) 98.7 F (37.1 C)  TempSrc: Oral Oral Oral Oral  Resp: Height:      Weight:      SpO2: 99% 99%  96%    Weight change:  Filed Weights   09/09/15 1517  Weight: 58.06 kg (128 lb)   Body mass index is 25 kg/(m^2).   Gen Exam: Awake and alert with clear speech.   Neck: Supple, No JVD.   Chest: B/L Clear.   CVS: S1 S2 Regular, no murmurs.  Abdomen: soft, BS +, non tender, non distended.  Extremities: no edema, lower extremities warm to touch. Neurologic: Non Focal.   Skin: No Rash.  Wounds: N/A.    Intake/Output from previous day:  Intake/Output Summary (Last 24 hours) at 09/10/15 1412 Last data filed at 09/10/15 8119  Gross per 24 hour  Intake    480 ml  Output      0 ml  Net    480 ml     LAB RESULTS: CBC  Recent Labs Lab 09/09/15 0909 09/09/15 0916  WBC 11.1*  --   HGB 13.1 14.6  HCT 37.9 43.0  PLT 297  --   MCV 87.1  --   MCH 30.1  --   MCHC 34.6  --   RDW 13.2  --   LYMPHSABS 1.0  --   MONOABS 0.8  --   EOSABS 0.0  --   BASOSABS 0.0  --     Chemistries   Recent Labs Lab 09/09/15 0909 09/09/15 1478  09/10/15 0644  NA 136 136 140  K 3.8 3.7 4.3  CL 100* 99* 106  CO2 24  --  25  GLUCOSE 79 75 85  BUN 14 19 6   CREATININE 1.04* 1.20* 0.81  CALCIUM 9.4  --  8.7*    CBG:  Recent Labs Lab 09/09/15 0906  GLUCAP 75    GFR Estimated Creatinine Clearance: 76.5 mL/min (by C-G formula based on Cr of 0.81).  Coagulation profile  Recent Labs Lab 09/09/15 0909  INR 1.08    Cardiac Enzymes No results for input(s): CKMB, TROPONINI, MYOGLOBIN in the last 168 hours.  Invalid input(s): CK  Invalid input(s): POCBNP No results for input(s): DDIMER in the last 72 hours. No results for input(s): HGBA1C in the last 72 hours.  Recent Labs  09/10/15 0644  CHOL 135  HDL 49  LDLCALC 76  TRIG 50  CHOLHDL 2.8    No results for input(s): TSH, T4TOTAL, T3FREE, THYROIDAB in the last 72 hours.  Invalid input(s): FREET3 No results for input(s): VITAMINB12, FOLATE, FERRITIN, TIBC, IRON, RETICCTPCT in the last 72 hours. No results for input(s): LIPASE, AMYLASE in the last 72 hours.  Urine Studies No results for input(s): UHGB, CRYS in the last 72 hours.  Invalid input(s): UACOL, UAPR, USPG, UPH, UTP, UGL, UKET, UBIL, UNIT, UROB, ULEU, UEPI, UWBC, URBC, UBAC, CAST, UCOM, BILUA  MICROBIOLOGY: No results found for this or any previous visit (from the past 240 hour(s)).  RADIOLOGY STUDIES/RESULTS: Ct Head Wo Contrast  09/09/2015  ADDENDUM REPORT: 09/09/2015 09:41 ADDENDUM: Study discussed by telephone with Dr. Ritta Slot on 09/09/2015 at 0935 hours. Electronically Signed   By: Odessa Fleming M.D.   On: 09/09/2015 09:41  09/09/2015  CLINICAL DATA:  35 year old female code stroke. Acute onset dizziness and leaning to the right this morning. Initial encounter. EXAM: CT HEAD WITHOUT CONTRAST TECHNIQUE: Contiguous axial images were obtained from the base of the skull through the vertex without intravenous contrast. COMPARISON:  None. FINDINGS: Visualized paranasal sinuses and mastoids are clear. No osseous abnormality identified. Visualized orbits and scalp soft tissues are within normal limits. Cerebral volume is normal. No midline shift, ventriculomegaly, mass effect, evidence of mass lesion, intracranial hemorrhage or evidence of cortically based acute infarction. Gray-white matter differentiation is within normal limits throughout the brain. Dominant appearing distal left vertebral artery. No suspicious intracranial vascular hyperdensity. IMPRESSION: Normal noncontrast CT appearance of the brain. Electronically Signed: By: Odessa Fleming M.D. On: 09/09/2015 09:32   Ct Angio Neck W/cm &/or Wo/cm  09/10/2015  CLINICAL DATA:  Right-sided weakness and numbness. Vertebral artery dissection. Right-sided neck pain. EXAM: CT  ANGIOGRAPHY NECK TECHNIQUE: Multidetector CT imaging of the neck was performed using the standard protocol during bolus administration of intravenous contrast. Multiplanar CT image reconstructions and MIPs were obtained to evaluate the vascular anatomy. Carotid stenosis measurements (when applicable) are obtained utilizing NASCET criteria, using the distal internal carotid diameter as the denominator. CONTRAST:  50mL OMNIPAQUE IOHEXOL 350 MG/ML SOLN COMPARISON:  MRI brain, MRA head, and MRA neck 09/09/2015. FINDINGS: Aortic arch: The left vertebral artery originates directly from the aortic arch. There is no focal calcification or stenosis in the arch. Right carotid system: The right common carotid artery is within normal limits. The bifurcation is unremarkable. The cervical right ICA is normal. The visualized intracranial ICA is within normal limits as well. Left carotid system: The left common carotid artery is within normal limits. The bifurcation is unremarkable. Cervical left ICA is within normal  limits. Vertebral arteries:The dominant left vertebral artery originates directly from the aortic arch. The left vertebral artery enters at C5-6. The non dominant right vertebral artery arises from the subclavian artery and enters the spinal canal at C6-7. The irregularity evident in the mid cervical right vertebral artery was likely artifactual. There is focal irregular narrowing of the right vertebral artery at the C1-2 level suggesting a focal dissection. There is then segmental irregularity in the remainder of the right vertebral artery which terminates at the PICA. The left vertebral artery goes onto the come the basilar artery without a focal stenosis. Skeleton: Unremarkable Other neck: No focal mucosal or submucosal lesions are present. The vocal cords are midline and symmetric. No significant adenopathy is present. The salivary glands are within normal limits. The lung apices are clear. IMPRESSION: 1. Focal  irregularity and stenosis of the non dominant right vertebral artery at the C1-2 level with segmental irregularity from the out level superiorly to the right PICA origin compatible with focal dissection. 2. The dominant left vertebral artery originates from the aortic arch without any focal stenosis or irregularity. 3. The anterior circulation is within normal limits. Electronically Signed   By: Marin Roberts M.D.   On: 09/10/2015 12:09   Mr Shirlee Latch Wo Contrast  09/09/2015  CLINICAL DATA:  35 year old female awoke with pain in right neck and subsequently experienced dizziness and fall. Subsequent encounter. EXAM: MRI HEAD WITHOUT AND WITH CONTRAST MRA HEAD WITHOUT CONTRAST MRA NECK WITHOUT AND WITH CONTRAST TECHNIQUE: Multiplanar, multiecho pulse sequences of the brain and surrounding structures were obtained without and with intravenous contrast. Angiographic images of the Circle of Willis were obtained using MRA technique without intravenous contrast. Angiographic images of the neck were obtained using MRA technique without and with intravenous contrast. Carotid stenosis measurements (when applicable) are obtained utilizing NASCET criteria, using the distal internal carotid diameter as the denominator. CONTRAST:  13mL MULTIHANCE GADOBENATE DIMEGLUMINE 529 MG/ML IV SOLN COMPARISON:  None. FINDINGS: MRI HEAD FINDINGS Acute small nonhemorrhagic infarct inferior right cerebellum (measuring up to 1 cm). No intracranial mass or abnormal enhancement. No hydrocephalus. Slightly heterogeneous bone marrow may be normal for this patient. Correlation with CBC to exclude anemia contributing to this appearance may be considered. Cervical medullary junction, pituitary region, pineal region and orbital structures unremarkable. MRA HEAD FINDINGS Right vertebral artery ends in a posterior inferior cerebellar artery distribution. The right vertebral artery appears irregular and narrowed. Question fibromuscular dysplasia  with dissection. Ectatic patent left vertebral artery without significant narrowing or regularity. Mildly ectatic basilar artery without significant narrowing or regularity. Nonvisualized anterior inferior cerebellar arteries. Slight irregularity superior cerebellar arteries. Minimal narrowing proximal left posterior cerebral artery. Anterior circulation without medium or large size vessel significant stenosis or occlusion. Ectatic cavernous segment internal carotid arteries greater on left. Two tiny bulges distal M1 segment left middle cerebral artery/middle cerebral artery bifurcation appear to be origin of vessels rather than small aneurysm. Mild irregularity middle cerebral artery branch vessels may be related to limitations of the present exam. MRA NECK FINDINGS Left vertebral artery arises directly from the aortic arch. Narrowing versus artifact proximal left common carotid artery, left vertebral artery and left subclavian artery. Right vertebral artery is small with slight irregularity distal aspect raises possibility of dissection and possibly of fibromuscular dysplasia. Dominant left vertebral artery is mildly ectatic. No significant stenosis of either carotid bifurcation or either internal carotid artery. IMPRESSION: MRI HEAD Acute small nonhemorrhagic infarct inferior right cerebellum (measuring up to 1 cm).  Slightly heterogeneous bone marrow may be normal for this patient. Correlation with CBC to exclude anemia contributing to this appearance may be considered. MRA HEAD Right vertebral artery ends in a posterior inferior cerebellar artery distribution. The right vertebral artery appears irregular and narrowed. Question fibromuscular dysplasia with dissection. Nonvisualized anterior inferior cerebellar arteries. Slight irregularity superior cerebellar arteries. Minimal narrowing proximal left posterior cerebral artery. Ectatic cavernous segment internal carotid arteries greater on left. Two tiny bulges  distal M1 segment left middle cerebral artery/middle cerebral artery bifurcation appear to be origin of vessels rather than small aneurysms. Mild irregularity middle cerebral artery branch vessels may be related to limitations of the present exam. MRA NECK FINDINGS Left vertebral artery arises directly from the aortic arch. Narrowing versus artifact proximal left common carotid artery, left vertebral artery and left subclavian artery. Right vertebral artery is small with slight irregularity distal aspect raises possibility of dissection and possibly of fibromuscular dysplasia. Dominant left vertebral artery is mildly ectatic. No significant stenosis of either carotid bifurcation or either internal carotid artery. These results were called by telephone at the time of interpretation on 09/09/2015 at 12:38 pm to Dr. Ritta Slot , who verbally acknowledged these results. Electronically Signed   By: Lacy Duverney M.D.   On: 09/09/2015 13:05   Mr Angiogram Neck W Wo Contrast  09/09/2015  CLINICAL DATA:  34 year old female awoke with pain in right neck and subsequently experienced dizziness and fall. Subsequent encounter. EXAM: MRI HEAD WITHOUT AND WITH CONTRAST MRA HEAD WITHOUT CONTRAST MRA NECK WITHOUT AND WITH CONTRAST TECHNIQUE: Multiplanar, multiecho pulse sequences of the brain and surrounding structures were obtained without and with intravenous contrast. Angiographic images of the Circle of Willis were obtained using MRA technique without intravenous contrast. Angiographic images of the neck were obtained using MRA technique without and with intravenous contrast. Carotid stenosis measurements (when applicable) are obtained utilizing NASCET criteria, using the distal internal carotid diameter as the denominator. CONTRAST:  13mL MULTIHANCE GADOBENATE DIMEGLUMINE 529 MG/ML IV SOLN COMPARISON:  None. FINDINGS: MRI HEAD FINDINGS Acute small nonhemorrhagic infarct inferior right cerebellum (measuring up to 1 cm).  No intracranial mass or abnormal enhancement. No hydrocephalus. Slightly heterogeneous bone marrow may be normal for this patient. Correlation with CBC to exclude anemia contributing to this appearance may be considered. Cervical medullary junction, pituitary region, pineal region and orbital structures unremarkable. MRA HEAD FINDINGS Right vertebral artery ends in a posterior inferior cerebellar artery distribution. The right vertebral artery appears irregular and narrowed. Question fibromuscular dysplasia with dissection. Ectatic patent left vertebral artery without significant narrowing or regularity. Mildly ectatic basilar artery without significant narrowing or regularity. Nonvisualized anterior inferior cerebellar arteries. Slight irregularity superior cerebellar arteries. Minimal narrowing proximal left posterior cerebral artery. Anterior circulation without medium or large size vessel significant stenosis or occlusion. Ectatic cavernous segment internal carotid arteries greater on left. Two tiny bulges distal M1 segment left middle cerebral artery/middle cerebral artery bifurcation appear to be origin of vessels rather than small aneurysm. Mild irregularity middle cerebral artery branch vessels may be related to limitations of the present exam. MRA NECK FINDINGS Left vertebral artery arises directly from the aortic arch. Narrowing versus artifact proximal left common carotid artery, left vertebral artery and left subclavian artery. Right vertebral artery is small with slight irregularity distal aspect raises possibility of dissection and possibly of fibromuscular dysplasia. Dominant left vertebral artery is mildly ectatic. No significant stenosis of either carotid bifurcation or either internal carotid artery. IMPRESSION: MRI HEAD Acute small nonhemorrhagic  infarct inferior right cerebellum (measuring up to 1 cm). Slightly heterogeneous bone marrow may be normal for this patient. Correlation with CBC to  exclude anemia contributing to this appearance may be considered. MRA HEAD Right vertebral artery ends in a posterior inferior cerebellar artery distribution. The right vertebral artery appears irregular and narrowed. Question fibromuscular dysplasia with dissection. Nonvisualized anterior inferior cerebellar arteries. Slight irregularity superior cerebellar arteries. Minimal narrowing proximal left posterior cerebral artery. Ectatic cavernous segment internal carotid arteries greater on left. Two tiny bulges distal M1 segment left middle cerebral artery/middle cerebral artery bifurcation appear to be origin of vessels rather than small aneurysms. Mild irregularity middle cerebral artery branch vessels may be related to limitations of the present exam. MRA NECK FINDINGS Left vertebral artery arises directly from the aortic arch. Narrowing versus artifact proximal left common carotid artery, left vertebral artery and left subclavian artery. Right vertebral artery is small with slight irregularity distal aspect raises possibility of dissection and possibly of fibromuscular dysplasia. Dominant left vertebral artery is mildly ectatic. No significant stenosis of either carotid bifurcation or either internal carotid artery. These results were called by telephone at the time of interpretation on 09/09/2015 at 12:38 pm to Dr. Ritta Slot , who verbally acknowledged these results. Electronically Signed   By: Lacy Duverney M.D.   On: 09/09/2015 13:05   Mr Laqueta Jean ZO Contrast  09/09/2015  CLINICAL DATA:  34 year old female awoke with pain in right neck and subsequently experienced dizziness and fall. Subsequent encounter. EXAM: MRI HEAD WITHOUT AND WITH CONTRAST MRA HEAD WITHOUT CONTRAST MRA NECK WITHOUT AND WITH CONTRAST TECHNIQUE: Multiplanar, multiecho pulse sequences of the brain and surrounding structures were obtained without and with intravenous contrast. Angiographic images of the Circle of Willis were obtained  using MRA technique without intravenous contrast. Angiographic images of the neck were obtained using MRA technique without and with intravenous contrast. Carotid stenosis measurements (when applicable) are obtained utilizing NASCET criteria, using the distal internal carotid diameter as the denominator. CONTRAST:  13mL MULTIHANCE GADOBENATE DIMEGLUMINE 529 MG/ML IV SOLN COMPARISON:  None. FINDINGS: MRI HEAD FINDINGS Acute small nonhemorrhagic infarct inferior right cerebellum (measuring up to 1 cm). No intracranial mass or abnormal enhancement. No hydrocephalus. Slightly heterogeneous bone marrow may be normal for this patient. Correlation with CBC to exclude anemia contributing to this appearance may be considered. Cervical medullary junction, pituitary region, pineal region and orbital structures unremarkable. MRA HEAD FINDINGS Right vertebral artery ends in a posterior inferior cerebellar artery distribution. The right vertebral artery appears irregular and narrowed. Question fibromuscular dysplasia with dissection. Ectatic patent left vertebral artery without significant narrowing or regularity. Mildly ectatic basilar artery without significant narrowing or regularity. Nonvisualized anterior inferior cerebellar arteries. Slight irregularity superior cerebellar arteries. Minimal narrowing proximal left posterior cerebral artery. Anterior circulation without medium or large size vessel significant stenosis or occlusion. Ectatic cavernous segment internal carotid arteries greater on left. Two tiny bulges distal M1 segment left middle cerebral artery/middle cerebral artery bifurcation appear to be origin of vessels rather than small aneurysm. Mild irregularity middle cerebral artery branch vessels may be related to limitations of the present exam. MRA NECK FINDINGS Left vertebral artery arises directly from the aortic arch. Narrowing versus artifact proximal left common carotid artery, left vertebral artery and  left subclavian artery. Right vertebral artery is small with slight irregularity distal aspect raises possibility of dissection and possibly of fibromuscular dysplasia. Dominant left vertebral artery is mildly ectatic. No significant stenosis of either carotid bifurcation or either  internal carotid artery. IMPRESSION: MRI HEAD Acute small nonhemorrhagic infarct inferior right cerebellum (measuring up to 1 cm). Slightly heterogeneous bone marrow may be normal for this patient. Correlation with CBC to exclude anemia contributing to this appearance may be considered. MRA HEAD Right vertebral artery ends in a posterior inferior cerebellar artery distribution. The right vertebral artery appears irregular and narrowed. Question fibromuscular dysplasia with dissection. Nonvisualized anterior inferior cerebellar arteries. Slight irregularity superior cerebellar arteries. Minimal narrowing proximal left posterior cerebral artery. Ectatic cavernous segment internal carotid arteries greater on left. Two tiny bulges distal M1 segment left middle cerebral artery/middle cerebral artery bifurcation appear to be origin of vessels rather than small aneurysms. Mild irregularity middle cerebral artery branch vessels may be related to limitations of the present exam. MRA NECK FINDINGS Left vertebral artery arises directly from the aortic arch. Narrowing versus artifact proximal left common carotid artery, left vertebral artery and left subclavian artery. Right vertebral artery is small with slight irregularity distal aspect raises possibility of dissection and possibly of fibromuscular dysplasia. Dominant left vertebral artery is mildly ectatic. No significant stenosis of either carotid bifurcation or either internal carotid artery. These results were called by telephone at the time of interpretation on 09/09/2015 at 12:38 pm to Dr. Ritta Slot , who verbally acknowledged these results. Electronically Signed   By: Lacy Duverney  M.D.   On: 09/09/2015 13:05    Jeoffrey Massed, MD  [facility] Pager:[phone]  If 7PM-7AM, please contact night-coverage [email or web address] Password [email or web address] 09/10/2015, 2:12 PM   LOS: 1 day

## 2015-09-10 NOTE — Evaluation (Signed)
Speech Language Pathology Evaluation Patient Details Name: Teresa Guzman MRN: [id number] DOB: August 02, 1981 Today's Date: 09/10/2015 Time: 0920-0940 SLP Time Calculation (min) (ACUTE ONLY): 20 min  Problem List:  Patient Active Problem List   Diagnosis Date Noted  . CVA (cerebral infarction) 09/09/2015  . Asthma 09/09/2015  . AKI (acute kidney injury) (HCC) 09/09/2015  . 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 Vertebral artery dissection (HCC)   . Pap smear for cervical cancer screening 07/21/2015  . Right foot infection 06/21/2015  . Acute bacterial bronchitis 02/26/2015  . Influenza A 09/29/2014  . Acute bacterial sinusitis 09/29/2014  . Paronychia of third finger of right hand 05/06/2014  . Cough variant asthma 05/15/2013  . Left foot pain 12/13/2012  . Traumatic loss of toenail 12/11/2012  . Sinusitis 09/13/2012  . Rash and nonspecific skin eruption 03/25/2012  . Superficial bruising of thigh 03/25/2012  . Dermatitis 02/23/2012  . Screening for malignant neoplasm of the cervix 12/14/2010  . Physical exam 12/14/2010  . Infertility, female 12/14/2010  . OTHER AND UNSPECIFIED OVARIAN CYST 05/10/2010  . VITAMIN D DEFICIENCY 05/06/2010  . GAS/BLOATING 05/06/2010  . UTI 11/17/2009  . ACUTE BRONCHITIS 08/13/2009  . NAUSEA 08/13/2009  . POLYDIPSIA 05/13/2009  . INJURY OF FACE AND NECK OTHER AND UNSPECIFIED 03/19/2009  . KNEE SPRAIN, LEFT, MEDIAL COLLATERAL LIGAMENT 11/06/2008  . FOOT, PAIN 10/29/2008  . Hypothyroidism 10/02/2008  . ANEMIA-NOS 10/02/2008  . ADD 10/02/2008  . MIGRAINE 10/02/2008  . RHINITIS 10/02/2008  . ABDOMINAL PAIN 10/02/2008  . BIPOLAR AFFECTIVE DISORDER, HX OF 10/02/2008   Past Medical History:  Past Medical History  Diagnosis Date  . Endometriosis   . Hypothyroidism    Past Surgical History:  Past Surgical History  Procedure Laterality Date  . Arm surgery Left 1987  . Cyst removal hand     HPI:  35 yo female adm  to [facility] with right sided weakness/nausea and vomiting. Pt found to have small right cerebellar CVA.  PMH + for endometriosis and hypothyroidism.     Assessment / Plan / Recommendation Clinical Impression  Pt presents with functional cognitive linguistic skills = score on MOCA *Montreal Cognitive Assessement was 27/30 indicating normal ability.  Pt did self correct on naming animal.  Areas of difficulty were with [place] recall with benefiting from category cue for one item, mental math and abstract thought.  Pt's speech was fluent with no dysarthria.  No follow up SLP indicated as pt WNL.  Thanks for this referral.     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[employer])   Cognition  Overall Cognitive Status: Within Functional Limits for tasks assessed Arousal/Alertness: Awake/alert Orientation Level: Oriented X4 Attention: Sustained Sustained Attention: Appears intact Memory:  (pt recalled 4/5 words independently, 1 with category cue) Awareness: Appears intact Problem Solving: Appears intact Safety/Judgment: Appears intact Comments: abstract thinking - 1/2 correct, other pt stated literal similarity     Comprehension  Auditory Comprehension Overall Auditory Comprehension: Appears within functional limits for tasks assessed Yes/No Questions: Within Functional Limits Commands: Within Functional Limits Conversation: Complex Visual Recognition/Discrimination Discrimination: Within Function Limits Reading Comprehension Reading  Status: Not tested    Expression Expression Primary Mode of Expression: Verbal Verbal Expression Overall  Verbal Expression: Appears within functional limits for tasks assessed Initiation: No impairment Repetition: No impairment Naming: No impairment (self corrected x1 immediately) Pragmatics: No impairment Written Expression Dominant Hand: Right Written Expression: Within Functional Limits   Oral / Motor  Oral Motor/Sensory Function Overall Oral Motor/Sensory Function: Within functional limits Motor Speech Overall Motor Speech: Appears within functional limits for tasks assessed Respiration: Within functional limits Resonance: Within functional limits Articulation: Within functional limitis Motor Planning: Witnin functional limits   GO          Functional Assessment Tool Used: MOCA score, clinical judgement Functional Limitations: Spoken language expressive Spoken Language Expression Current Status (I6270): 0 percent impaired, limited or restricted Spoken Language Expression Goal Status (J5009): 0 percent impaired, limited or restricted Spoken Language Expression Discharge Status [phone]): 0 percent impaired, limited or restricted         Donavan Burnet, MS [facility] SLP [phone]

## 2015-09-10 NOTE — Progress Notes (Signed)
Echocardiogram 2D Echocardiogram has been performed.  Nolon Rod 09/10/2015, 3:00 PM

## 2015-09-10 NOTE — Progress Notes (Signed)
STROKE TEAM PROGRESS NOTE   HISTORY OF PRESENT ILLNESS Teresa Guzman is a 35 y.o. female who awoke normally this morning, got her kids ready for school. She was waiting for the gym to open around 5:45 AM when she began to experience dizziness, and noticed that she was leaning to the right (LKW 09/09/2015 at 545a). She continued with her workout and went home and was taking a shower when she fell. She continued to have symptoms and therefore sought further therapy in the emergency room. She states that she awoke with a "crick in her neck." This is been persistent and now she has some head pain in the posterior right side. He does get migraines with aura from time to time, the or typically consisting of spots in her vision. Premorbid modified rankin scale: 0. Patient was not administered TPA secondary to mild symptoms. She was admitted for further evaluation and treatment.   SUBJECTIVE (INTERVAL HISTORY) Her husband is at the bedside but left before round ended.  Overall she feels her condition is rapidly improving. She admitted that she has been doing cross-ups for her work out for the last 8 months. Pretty intensive involving weight lifting, push ups, running, etc. She will frequent body stretching, neck stretching...  She also has migraine headache, always starting from right posterior head and radiating to her right head and right eye. Started at age of 31-19, twice a month, lasting 3-4 days. No medication helps including imitrex. Currently not on any medications. This time she also felt pain from right posterior head on waking up yesterday, continued work out but after that she had difficult walking leaning toward right side and fell not able to get up.    OBJECTIVE Temp:  [97.6 F (36.4 C)-98.6 F (37 C)] 98.1 F (36.7 C) (02/03 0641) Pulse Rate:  [89-111] 90 (02/03 0641) Cardiac Rhythm:  [-] Normal sinus rhythm (02/02 1940) Resp:  [15-24] 18 (02/03 0641) BP: (113-126)/(53-80) 113/64 mmHg (02/03  0641) SpO2:  [97 %-100 %] 99 % (02/03 0641) Weight:  [58.06 kg (128 lb)] 58.06 kg (128 lb) (02/02 1517)  CBC:  Recent Labs Lab 09/09/15 0909 09/09/15 0916  WBC 11.1*  --   NEUTROABS 9.2*  --   HGB 13.1 14.6  HCT 37.9 43.0  MCV 87.1  --   PLT 297  --     Basic Metabolic Panel:  Recent Labs Lab 09/09/15 0909 09/09/15 0916 09/10/15 0644  NA 136 136 140  K 3.8 3.7 4.3  CL 100* 99* 106  CO2 24  --  25  GLUCOSE 79 75 85  BUN 14 19 6   CREATININE 1.04* 1.20* 0.81  CALCIUM 9.4  --  8.7*    Lipid Panel:    Component Value Date/Time   CHOL 135 09/10/2015 0644   TRIG 50 09/10/2015 0644   HDL 49 09/10/2015 0644   CHOLHDL 2.8 09/10/2015 0644   VLDL 10 09/10/2015 0644   LDLCALC 76 09/10/2015 0644   HgbA1c: No results found for: HGBA1C Urine Drug Screen: No results found for: LABOPIA, COCAINSCRNUR, LABBENZ, AMPHETMU, THCU, LABBARB    IMAGING I have personally reviewed the radiological images below and agree with the radiology interpretations.  Ct Head Wo Contrast 09/09/2015   Normal noncontrast CT appearance of the brain.   MRI HEAD  09/09/2015   Acute small nonhemorrhagic infarct inferior right cerebellum (measuring up to 1 cm). Slightly heterogeneous bone marrow may be normal for this patient. Correlation with CBC to exclude anemia  contributing to this appearance may be considered.   MRA HEAD  09/09/2015   Right vertebral artery ends in a posterior inferior cerebellar artery distribution. The right vertebral artery appears irregular and narrowed. Question fibromuscular dysplasia with dissection. Nonvisualized anterior inferior cerebellar arteries. Slight irregularity superior cerebellar arteries. Minimal narrowing proximal left posterior cerebral artery. Ectatic cavernous segment internal carotid arteries greater on left. Two tiny bulges distal M1 segment left middle cerebral artery/middle cerebral artery bifurcation appear to be origin of vessels rather than small aneurysms.  Mild irregularity middle cerebral artery branch vessels may be related to limitations of the present exam.   MRA NECK  09/09/2015   Left vertebral artery arises directly from the aortic arch. Narrowing versus artifact proximal left common carotid artery, left vertebral artery and left subclavian artery. Right vertebral artery is small with slight irregularity distal aspect raises possibility of dissection and possibly of fibromuscular dysplasia. Dominant left vertebral artery is mildly ectatic. No significant stenosis of either carotid bifurcation or either internal carotid artery.   CTA neck 1. Focal irregularity and stenosis of the non dominant right vertebral artery at the C1-2 level with segmental irregularity from the out level superiorly to the right PICA origin compatible with focal dissection. 2. The dominant left vertebral artery originates from the aortic arch without any focal stenosis or irregularity. 3. The anterior circulation is within normal limits.  2D echo - pending   Physical exam  Temp:  [98.1 F (36.7 C)-98.7 F (37.1 C)] 98.7 F (37.1 C) (02/03 1403) Pulse Rate:  [89-99] 89 (02/03 1403) Resp:  [16-20] 18 (02/03 1403) BP: (113-126)/(53-64) 113/61 mmHg (02/03 1403) SpO2:  [96 %-100 %] 96 % (02/03 1403) Weight:  [128 lb (58.06 kg)] 128 lb (58.06 kg) (02/02 1517)  General - Well nourished, well developed, in no apparent distress, but anxious  Ophthalmologic - Sharp disc margins OU.   Cardiovascular - Regular rate and rhythm with no murmur.  Mental Status -  Level of arousal and orientation to time, place, and person were intact. Language including expression, naming, repetition, comprehension was assessed and found intact. Fund of Knowledge was assessed and was intact.  Cranial Nerves II - XII - II - Visual field intact OU. III, IV, VI - Extraocular movements intact. V - Facial sensation intact bilaterally. VII - Facial movement intact bilaterally VIII -  Hearing & vestibular intact bilaterally. X - Palate elevates symmetrically. XI - Chin turning & shoulder shrug intact bilaterally. XII - Tongue protrusion intact.  Motor Strength - The patient's strength was normal in all extremities and pronator drift was absent.  Bulk was normal and fasciculations were absent.   Motor Tone - Muscle tone was assessed at the neck and appendages and was normal.  Reflexes - The patient's reflexes were 1+ in all extremities and she had no pathological reflexes.  Sensory - Light touch, temperature/pinprick were assessed and were symmetrical.    Coordination - The patient had normal movements in the hands and feet with no ataxia or dysmetria.  Tremor was absent  Gait and Station - deferred to PT due to safety concerns   ASSESSMENT/PLAN Teresa Guzman is a 35 y.o. female with history of migraine and hypothyroidism presenting with dizziness, leaning to the R. She did not receive IV t-PA due to mild symptoms.   Stroke:  right cerebellar infarct embolic secondary to right VA dissection likely due to aggressive exercise  Resultant  Mild subjective dizziness  MRI  R cerebellar infarct  MRA right VA stenosis concerning for dissection  CTA neck - right VA focal dissection  2D Echo  pending  LDL 76  HgbA1c pending  Lovenox 40 mg sq daily for VTE prophylaxis  Diet Heart Room service appropriate?: Yes; Fluid consistency:: Thin  No antithrombotic prior to admission, now on aspirin 325 mg daily and clopidogrel 75 mg daily. Continue dural antiplatelet for 3 months and then plavix alone.   Patient counseled to be compliant with her antithrombotic medications  Ongoing aggressive stroke risk factor management  Therapy recommendations:  pending  Disposition:  pending  Hyperlipidemia  Home meds:  none  LDL 76, goal < 70  Add lipitor  daily  Continue statin at discharge  Migraine  Long standing condition  Avoid imitrex due to  stroke  fioricet PRN  Other Active Problems  AKI - resolved after IVF  Hypothyroidism  Asthma  Anemia - NOS  Hospital day # 1  Neurology will sign off. Please call with questions. Pt will follow up with Dr. [name] at [facility] in about 2 months. Thanks for the consult.  Marvel Plan, MD PhD Stroke Neurology 09/10/2015 2:36 PM      To contact Stroke Continuity provider, please refer to [email or web address]. After hours, contact General Neurology

## 2015-09-10 NOTE — Progress Notes (Signed)
OT Cancellation Note  Patient Details Name: Teresa Guzman MRN: [id number] DOB: Dec 31, 1980   Cancelled Treatment:    Reason Eval/Treat Not Completed: Patient at procedure or test/ unavailable (ECHO). 2nd attempt to see pt for OT eval. Will follow up as time allows.  Gaye Alken M.S., OTR/L Pager: [phone]  09/10/2015, 2:35 PM

## 2015-09-11 DIAGNOSIS — N179 Acute kidney failure, unspecified: Secondary | ICD-10-CM

## 2015-09-11 DIAGNOSIS — Z8673 Personal history of transient ischemic attack (TIA), and cerebral infarction without residual deficits: Secondary | ICD-10-CM | POA: Insufficient documentation

## 2015-09-11 LAB — HEMOGLOBIN A1C
Hgb A1c MFr Bld: 5.7 % — ABNORMAL HIGH (ref 4.8–5.6)
Mean Plasma Glucose: 117 mg/dL

## 2015-09-11 MED ORDER — STROKE: EARLY STAGES OF RECOVERY BOOK: Freq: Once | Status: DC

## 2015-09-11 MED ORDER — ATORVASTATIN CALCIUM 10 MG PO TABS: 10.0000 mg | ORAL_TABLET | Freq: Every day | ORAL | Status: DC

## 2015-09-11 MED ORDER — ASPIRIN 325 MG PO TBEC: 325.0000 mg | DELAYED_RELEASE_TABLET | Freq: Every day | ORAL | Status: DC

## 2015-09-11 MED ORDER — CLOPIDOGREL BISULFATE 75 MG PO TABS: 75.0000 mg | ORAL_TABLET | Freq: Every day | ORAL | Status: DC

## 2015-09-11 MED ORDER — TRAMADOL HCL 50 MG PO TABS
50.0000 mg | ORAL_TABLET | Freq: Once | ORAL | Status: AC
  Administered 2015-09-11: 50 mg via ORAL
  Filled 2015-09-11: qty 1

## 2015-09-11 NOTE — Progress Notes (Signed)
OT Cancellation Note  Patient Details Name: SCOTT FIX MRN: [id number] DOB: 1981/02/06   Cancelled Treatment:    Reason Eval/Treat Not Completed: OT screened, no needs identified, will sign off  Angelene Giovanni [place], OTR/L [phone]  09/11/2015, 12:06 PM

## 2015-09-11 NOTE — Care Management Note (Signed)
Case Management Note  Patient Details  Name: Teresa Guzman MRN: [id number] Date of Birth: [birth year]  Subjective/Objective:                  CVA Action/Plan: Discharge planning Expected Discharge Date:  09/11/15               Expected Discharge Plan:  Home/Self Care  In-House Referral:     Discharge planning Services  CM Consult  Post Acute Care Choice:    Choice offered to:     DME Arranged:    DME Agency:     HH Arranged:    [place] Agency:     Status of Service:  Completed, signed off  Medicare Important Message Given:    Date Medicare IM Given:    Medicare IM give by:    Date Additional Medicare IM Given:    Additional Medicare Important Message give by:     If discussed at [place] of Stay Meetings, dates discussed:    Additional Comments: CM received call from RN stating Outpt PT/OT recc and per telephone order from MD, this CM has arranged with [facility].  CM met with pt to confirm.  NO other CM needs were communicated. Dellie Catholic, RN 09/11/2015, 11:21 AM

## 2015-09-11 NOTE — Plan of Care (Signed)
     Teresa Guzman was admitted to the Hospital on 09/09/2015 and Discharged  09/11/2015 and should be excused from work/school   for 10 days starting 09/09/2015 , may return to work/school without any restrictions.Seen by primary care physician prior to returning to work  Call Susa Raring MD, [facility]  [phone] with questions.  Leroy Sea M.D on 09/11/2015,at 11:56 AM  [facility]   Office  [phone]

## 2015-09-11 NOTE — Progress Notes (Signed)
Pt tearful this am about constant headache.  Floricet given during the night.  Tylenol offered to pt this am but pt refused stating "it doesn't help".  MD notified, awaiting new orders.  Will continue to monitor.   Estanislado Emms, RN

## 2015-09-11 NOTE — Discharge Summary (Signed)
Teresa Guzman, is a 35 y.o. female  DOB 12/28/1980  MRN [id number].  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 Endometriosis   . Hypothyroidism     Past Surgical History  Procedure Laterality Date  . Arm surgery Left 1987  . Cyst removal hand         HPI  from the history and physical done on the day of admission:    This is a 35 yo female patient with past medical history of anemia in setting of known endometriosis, hypothyroidism and asthma. Patient presented to the ER with complaints of falling to the right and right side weakness. Symptoms began immediately after working out today. She was also noted with gait ataxia and disequilibrium syndrome. While taking a shower she apparently fell. Recently she's had sinus infection symptomatology with sinus pressure and headache and was treated with antibiotics about 2 weeks ago. She awoke this morning with what she describes as a "crick in her neck" on the right side. She reports that since childhood every time she has a migraine she has a similar pain in her neck.  She has not had migraine symptomatology recently. Upon my evaluation right-sided symptomatology had resolved but she has not gotten out of bed from the stretcher to check for any ataxia or disequilibrium.    Hospital Course:    Acute nonhemorrhagic inferior right cerebellar infarct: Likely secondary to right vertebral artery dissection. May have underlying fibromuscular dysplasia-but dissection could have been precipitated by a vigorous exercise (works out/ [employer]). Although her symptoms started before her exercise session, continue antiplatelet agents both aspirin and Plavix for 3 months, follow with neurology thereafter antiplatelet medications likely to be reduced to Plavix only under the guidance of neurology. LDL was over 70 and has been placed on Lipitor. Echo stable. A1c stable. Will follow with outpatient neurology and PT.   She had nonspecific elevation of ANA with dissection of her right vertebral artery, question underlying fibrodysplasia. One-time outpatient rheumatology follow-up recommended upon discharge.     AKI: Resolved with hydration. Likely mild prerenal azotemia.  Hypothyroidism: Continue levothyroxine  History of bronchial asthma: Lungs clear, continue bronchodilators   Discharge Condition: Stable  Follow UP  Follow-up Information    Follow up with Xu,[name], MD. Schedule an  appointment as soon as possible for a visit in 2 months.   Specialty:  Neurology   Why:  stroke clinic   Contact information:   [address] [place] [address] [phone]       Follow up with [name].   Specialty:  Rehabilitation   Why:  [facility] will call you to schedule an appointment.   Contact information:   [address] [id number] mc [place] [address] [phone]      Follow up with Teresa Rhymes, MD. Schedule an appointment as soon as possible for a visit in 1 week.    Specialty:  Family Medicine   Contact information:   [address] [place] [address] [phone]       Follow up with Guzman,Teresa B, MD. Schedule an appointment as soon as possible for a visit in 1 week.   Specialty:  Rheumatology   Why:  positive ANA   Contact information:   [address] [place] [address] [place] [address] [phone]        Consults obtained - Neuro  Diet and Activity recommendation: See Discharge Instructions below  Discharge Instructions       Discharge Instructions    Ambulatory referral to Neurology    Complete by:  As directed   Pt will follow up with Dr. [name] at [facility] in about 2 months. Thanks.     Diet - low sodium heart healthy    Complete by:  As directed      Discharge instructions    Complete by:  As directed   Follow with Primary MD Teresa Rhymes, MD in 7 days   Get CBC, CMP, ANA, TSH checked  by Primary MD next visit.    Activity: As tolerated with Full fall precautions use walker/cane & assistance as needed   Disposition Home     Diet:   Heart Healthy  .  For Heart failure patients - Check your Weight same time everyday, if you gain over 2 pounds, or you develop in leg swelling, experience more shortness of breath or chest pain, call your Primary MD immediately. Follow Cardiac Low Salt Diet and 1.5 lit/day fluid restriction.   On your next visit with your primary care physician please Get Medicines reviewed and adjusted.   Please request your Prim.MD to go over all Hospital Tests and Procedure/Radiological results at the follow up, please get all Hospital records sent to your Prim MD by signing hospital release before you go home.   If you experience worsening of your admission symptoms, develop shortness of breath, life threatening emergency, suicidal or homicidal thoughts you must seek medical attention immediately by calling 911 or calling your MD immediately  if symptoms less severe.  You Must read complete  instructions/literature along with all the possible adverse reactions/side effects for all the Medicines you take and that have been prescribed to you. Take any new Medicines after you have completely understood and accpet all the possible adverse reactions/side effects.   Do not drive, operating heavy machinery, perform activities at heights, swimming or participation in water activities or provide baby sitting services if your were admitted for syncope or siezures until you have seen by Primary MD or a Neurologist and advised to do so again.  Do not drive when taking Pain medications.    Do not take more than prescribed Pain, Sleep and Anxiety Medications  Special Instructions: If you have smoked or chewed Tobacco  in the last 2 yrs please stop smoking, stop any regular Alcohol  and or any Recreational drug use.  Wear Seat belts while driving.   Please note  You were cared for by a hospitalist during your hospital stay. If you have any questions about your discharge medications or the care you received while you were in the hospital after you are discharged, you can call the unit and asked to speak with the hospitalist on call if the hospitalist that took care of you is not available. Once you are discharged, your primary care physician will handle any further medical issues. Please note that NO REFILLS for any discharge medications will be authorized once you are discharged, as it is imperative that you return to your primary care physician (or establish a relationship with a primary care physician if you do not have one) for your aftercare needs so that they can reassess your need for medications and monitor your lab values.     Increase activity slowly    Complete by:  As directed              Discharge Medications       Medication List    TAKE these medications        aspirin 325 MG EC tablet  Take 1 tablet (325 mg total) by mouth daily.     atorvastatin 10 MG tablet    Commonly known as:  LIPITOR  Take 1 tablet (10 mg total) by mouth daily at 6 PM.     Azelastine HCl 0.15 % Soln  use 2 sprays in each  nostril two times daily     clopidogrel 75 MG tablet  Commonly known as:  PLAVIX  Take 1 tablet (75 mg total) by mouth daily.     fluticasone 110 MCG/ACT inhaler  Commonly known as:  FLOVENT HFA  Inhale 1 puff into the lungs 2 (two) times daily.     fluticasone 50 MCG/ACT nasal spray  Commonly known as:  FLONASE  USE 1 TO 2 SPRAYS INTO THE  NOSE DAILY     levothyroxine 100 MCG tablet  Commonly known as:  SYNTHROID, LEVOTHROID  Take 1 tablet (100 mcg total) by mouth daily.     loratadine-pseudoephedrine 5-120 MG tablet  Commonly known as:  CLARITIN-D 12-hour  Take 1 tablet by mouth 2 (two) times daily.     montelukast 10 MG tablet  Commonly known as:  SINGULAIR  Take 1 tablet by mouth at  bedtime     multivitamin with minerals Tabs tablet  Take 1 tablet by mouth daily.     saccharomyces boulardii 250 MG capsule  Commonly known as:  FLORASTOR  Take 250 mg by mouth 2 (two) times daily.     TURMERIC PO  Take 1 Dose by mouth daily.     UNABLE TO FIND  Take 1 Dose by mouth daily as needed (pre-workout). C4 Pre Workout Powder: Vitamin C (as Ascorbic Acid), Niacin (as Niacinamide), Vitamin B6 (as Pyridoxal-5-Phosphate), Folic Acid, Vitamin B12 (as Methylcobalamin), Calcium, Beta Alanine, Creatine Nitrate, Arginine AKG, Explosive Energy Blend, N-Acetyl-L-tyrosine, Caffeine Anhydrous (150mg ), Velvet Bean (Mucuna pruriens) seed extract, (standardized for L-Dopa), TeaCor Tetramethyluric Acid     Vitamin D (Ergocalciferol) 50000 units Caps capsule  Commonly known as:  DRISDOL  Take 1 capsule (50,000 Units total) by mouth every 7 (seven) days.        Major procedures and Radiology Reports - PLEASE review detailed and final reports for all details,  in brief -   TTE  Left ventricle: The cavity size was normal. Wall thickness was normal.  Systolic function was normal. The estimated ejection fraction was in the range of 60% to 65%. Wall motion was normal; there were no regional wall motion abnormalities.   Ct Head Wo Contrast  09/09/2015  ADDENDUM REPORT: 09/09/2015 09:41 ADDENDUM: Study discussed by telephone with Dr. Ritta Slot on 09/09/2015 at 0935 hours. Electronically Signed   By: Odessa Fleming M.D.   On: 09/09/2015 09:41  09/09/2015  CLINICAL DATA:  35 year old female code stroke. Acute onset dizziness and leaning to the right this morning. Initial encounter. EXAM: CT HEAD WITHOUT CONTRAST TECHNIQUE: Contiguous axial images were obtained from the base of the skull through the vertex without intravenous contrast. COMPARISON:  None. FINDINGS: Visualized paranasal sinuses and mastoids are clear. No osseous abnormality identified. Visualized orbits and scalp soft tissues are within normal limits. Cerebral volume is normal. No midline shift, ventriculomegaly, mass effect, evidence of mass lesion, intracranial hemorrhage or evidence of cortically based acute infarction. Gray-white matter differentiation is within normal limits throughout the brain. Dominant appearing distal left vertebral artery. No suspicious intracranial vascular hyperdensity. IMPRESSION: Normal noncontrast CT appearance of the brain. Electronically Signed: By: Odessa Fleming M.D. On: 09/09/2015 09:32   Ct Angio Neck W/cm &/or Wo/cm  09/10/2015  CLINICAL DATA:  Right-sided weakness and numbness. Vertebral artery dissection. Right-sided neck pain. EXAM: CT ANGIOGRAPHY NECK TECHNIQUE: Multidetector CT imaging of the neck was performed using the standard protocol during bolus administration of intravenous contrast. Multiplanar CT image reconstructions and MIPs were obtained to evaluate the vascular anatomy. Carotid stenosis measurements (when applicable) are obtained utilizing NASCET criteria, using the distal internal carotid diameter as the denominator. CONTRAST:  50mL OMNIPAQUE  IOHEXOL 350 MG/ML SOLN COMPARISON:  MRI brain, MRA head, and MRA neck 09/09/2015. FINDINGS: Aortic arch: The left vertebral artery originates directly from the aortic arch. There is no focal calcification or stenosis in the arch. Right carotid system: The right common carotid artery is within normal limits. The bifurcation is unremarkable. The cervical right ICA is normal. The visualized intracranial ICA is within normal limits as well. Left carotid system: The left common carotid artery is within normal limits. The bifurcation is unremarkable. Cervical left ICA is within normal limits. Vertebral arteries:The dominant left vertebral artery originates directly from the aortic arch. The left vertebral artery enters at C5-6. The non dominant right vertebral artery arises from the subclavian artery and enters the spinal canal at C6-7. The irregularity evident in the mid cervical right vertebral artery was likely artifactual. There is focal irregular narrowing of the right vertebral artery at the C1-2 level suggesting a focal dissection. There is then segmental irregularity in the remainder of the right vertebral artery which terminates at the PICA. The left vertebral artery goes onto the come the basilar artery without a focal stenosis. Skeleton: Unremarkable Other neck: No focal mucosal or submucosal lesions are present. The vocal cords are midline and symmetric. No significant adenopathy is present. The salivary glands are within normal limits. The lung apices are clear. IMPRESSION: 1. Focal irregularity and stenosis of the non dominant right vertebral artery at the C1-2 level with segmental irregularity from the out level superiorly to the right PICA origin compatible with focal dissection. 2. The dominant left vertebral artery originates from the aortic arch without any focal stenosis or irregularity. 3. The anterior circulation is within normal limits. Electronically Signed   By: Virl Son.D.  On:  09/10/2015 12:09   Mr Maxine Glenn Head Wo Contrast  09/09/2015  CLINICAL DATA:  35 year old female awoke with pain in right neck and subsequently experienced dizziness and fall. Subsequent encounter. EXAM: MRI HEAD WITHOUT AND WITH CONTRAST MRA HEAD WITHOUT CONTRAST MRA NECK WITHOUT AND WITH CONTRAST TECHNIQUE: Multiplanar, multiecho pulse sequences of the brain and surrounding structures were obtained without and with intravenous contrast. Angiographic images of the Circle of Willis were obtained using MRA technique without intravenous contrast. Angiographic images of the neck were obtained using MRA technique without and with intravenous contrast. Carotid stenosis measurements (when applicable) are obtained utilizing NASCET criteria, using the distal internal carotid diameter as the denominator. CONTRAST:  13mL MULTIHANCE GADOBENATE DIMEGLUMINE 529 MG/ML IV SOLN COMPARISON:  None. FINDINGS: MRI HEAD FINDINGS Acute small nonhemorrhagic infarct inferior right cerebellum (measuring up to 1 cm). No intracranial mass or abnormal enhancement. No hydrocephalus. Slightly heterogeneous bone marrow may be normal for this patient. Correlation with CBC to exclude anemia contributing to this appearance may be considered. Cervical medullary junction, pituitary region, pineal region and orbital structures unremarkable. MRA HEAD FINDINGS Right vertebral artery ends in a posterior inferior cerebellar artery distribution. The right vertebral artery appears irregular and narrowed. Question fibromuscular dysplasia with dissection. Ectatic patent left vertebral artery without significant narrowing or regularity. Mildly ectatic basilar artery without significant narrowing or regularity. Nonvisualized anterior inferior cerebellar arteries. Slight irregularity superior cerebellar arteries. Minimal narrowing proximal left posterior cerebral artery. Anterior circulation without medium or large size vessel significant stenosis or occlusion.  Ectatic cavernous segment internal carotid arteries greater on left. Two tiny bulges distal M1 segment left middle cerebral artery/middle cerebral artery bifurcation appear to be origin of vessels rather than small aneurysm. Mild irregularity middle cerebral artery branch vessels may be related to limitations of the present exam. MRA NECK FINDINGS Left vertebral artery arises directly from the aortic arch. Narrowing versus artifact proximal left common carotid artery, left vertebral artery and left subclavian artery. Right vertebral artery is small with slight irregularity distal aspect raises possibility of dissection and possibly of fibromuscular dysplasia. Dominant left vertebral artery is mildly ectatic. No significant stenosis of either carotid bifurcation or either internal carotid artery. IMPRESSION: MRI HEAD Acute small nonhemorrhagic infarct inferior right cerebellum (measuring up to 1 cm). Slightly heterogeneous bone marrow may be normal for this patient. Correlation with CBC to exclude anemia contributing to this appearance may be considered. MRA HEAD Right vertebral artery ends in a posterior inferior cerebellar artery distribution. The right vertebral artery appears irregular and narrowed. Question fibromuscular dysplasia with dissection. Nonvisualized anterior inferior cerebellar arteries. Slight irregularity superior cerebellar arteries. Minimal narrowing proximal left posterior cerebral artery. Ectatic cavernous segment internal carotid arteries greater on left. Two tiny bulges distal M1 segment left middle cerebral artery/middle cerebral artery bifurcation appear to be origin of vessels rather than small aneurysms. Mild irregularity middle cerebral artery branch vessels may be related to limitations of the present exam. MRA NECK FINDINGS Left vertebral artery arises directly from the aortic arch. Narrowing versus artifact proximal left common carotid artery, left vertebral artery and left subclavian  artery. Right vertebral artery is small with slight irregularity distal aspect raises possibility of dissection and possibly of fibromuscular dysplasia. Dominant left vertebral artery is mildly ectatic. No significant stenosis of either carotid bifurcation or either internal carotid artery. These results were called by telephone at the time of interpretation on 09/09/2015 at 12:38 pm to Dr. Ritta Slot , who verbally acknowledged these results. Electronically Signed  By: Lacy Duverney M.D.   On: 09/09/2015 13:05   Mr Angiogram Neck W Wo Contrast  09/09/2015  CLINICAL DATA:  35 year old female awoke with pain in right neck and subsequently experienced dizziness and fall. Subsequent encounter. EXAM: MRI HEAD WITHOUT AND WITH CONTRAST MRA HEAD WITHOUT CONTRAST MRA NECK WITHOUT AND WITH CONTRAST TECHNIQUE: Multiplanar, multiecho pulse sequences of the brain and surrounding structures were obtained without and with intravenous contrast. Angiographic images of the Circle of Willis were obtained using MRA technique without intravenous contrast. Angiographic images of the neck were obtained using MRA technique without and with intravenous contrast. Carotid stenosis measurements (when applicable) are obtained utilizing NASCET criteria, using the distal internal carotid diameter as the denominator. CONTRAST:  13mL MULTIHANCE GADOBENATE DIMEGLUMINE 529 MG/ML IV SOLN COMPARISON:  None. FINDINGS: MRI HEAD FINDINGS Acute small nonhemorrhagic infarct inferior right cerebellum (measuring up to 1 cm). No intracranial mass or abnormal enhancement. No hydrocephalus. Slightly heterogeneous bone marrow may be normal for this patient. Correlation with CBC to exclude anemia contributing to this appearance may be considered. Cervical medullary junction, pituitary region, pineal region and orbital structures unremarkable. MRA HEAD FINDINGS Right vertebral artery ends in a posterior inferior cerebellar artery distribution. The right  vertebral artery appears irregular and narrowed. Question fibromuscular dysplasia with dissection. Ectatic patent left vertebral artery without significant narrowing or regularity. Mildly ectatic basilar artery without significant narrowing or regularity. Nonvisualized anterior inferior cerebellar arteries. Slight irregularity superior cerebellar arteries. Minimal narrowing proximal left posterior cerebral artery. Anterior circulation without medium or large size vessel significant stenosis or occlusion. Ectatic cavernous segment internal carotid arteries greater on left. Two tiny bulges distal M1 segment left middle cerebral artery/middle cerebral artery bifurcation appear to be origin of vessels rather than small aneurysm. Mild irregularity middle cerebral artery branch vessels may be related to limitations of the present exam. MRA NECK FINDINGS Left vertebral artery arises directly from the aortic arch. Narrowing versus artifact proximal left common carotid artery, left vertebral artery and left subclavian artery. Right vertebral artery is small with slight irregularity distal aspect raises possibility of dissection and possibly of fibromuscular dysplasia. Dominant left vertebral artery is mildly ectatic. No significant stenosis of either carotid bifurcation or either internal carotid artery. IMPRESSION: MRI HEAD Acute small nonhemorrhagic infarct inferior right cerebellum (measuring up to 1 cm). Slightly heterogeneous bone marrow may be normal for this patient. Correlation with CBC to exclude anemia contributing to this appearance may be considered. MRA HEAD Right vertebral artery ends in a posterior inferior cerebellar artery distribution. The right vertebral artery appears irregular and narrowed. Question fibromuscular dysplasia with dissection. Nonvisualized anterior inferior cerebellar arteries. Slight irregularity superior cerebellar arteries. Minimal narrowing proximal left posterior cerebral artery.  Ectatic cavernous segment internal carotid arteries greater on left. Two tiny bulges distal M1 segment left middle cerebral artery/middle cerebral artery bifurcation appear to be origin of vessels rather than small aneurysms. Mild irregularity middle cerebral artery branch vessels may be related to limitations of the present exam. MRA NECK FINDINGS Left vertebral artery arises directly from the aortic arch. Narrowing versus artifact proximal left common carotid artery, left vertebral artery and left subclavian artery. Right vertebral artery is small with slight irregularity distal aspect raises possibility of dissection and possibly of fibromuscular dysplasia. Dominant left vertebral artery is mildly ectatic. No significant stenosis of either carotid bifurcation or either internal carotid artery. These results were called by telephone at the time of interpretation on 09/09/2015 at 12:38 pm to Dr. Ritta Slot ,  who verbally acknowledged these results. Electronically Signed   By: Lacy Duverney M.D.   On: 09/09/2015 13:05   Mr Laqueta Jean ZO Contrast  09/09/2015  CLINICAL DATA:  35 year old female awoke with pain in right neck and subsequently experienced dizziness and fall. Subsequent encounter. EXAM: MRI HEAD WITHOUT AND WITH CONTRAST MRA HEAD WITHOUT CONTRAST MRA NECK WITHOUT AND WITH CONTRAST TECHNIQUE: Multiplanar, multiecho pulse sequences of the brain and surrounding structures were obtained without and with intravenous contrast. Angiographic images of the Circle of Willis were obtained using MRA technique without intravenous contrast. Angiographic images of the neck were obtained using MRA technique without and with intravenous contrast. Carotid stenosis measurements (when applicable) are obtained utilizing NASCET criteria, using the distal internal carotid diameter as the denominator. CONTRAST:  13mL MULTIHANCE GADOBENATE DIMEGLUMINE 529 MG/ML IV SOLN COMPARISON:  None. FINDINGS: MRI HEAD FINDINGS Acute  small nonhemorrhagic infarct inferior right cerebellum (measuring up to 1 cm). No intracranial mass or abnormal enhancement. No hydrocephalus. Slightly heterogeneous bone marrow may be normal for this patient. Correlation with CBC to exclude anemia contributing to this appearance may be considered. Cervical medullary junction, pituitary region, pineal region and orbital structures unremarkable. MRA HEAD FINDINGS Right vertebral artery ends in a posterior inferior cerebellar artery distribution. The right vertebral artery appears irregular and narrowed. Question fibromuscular dysplasia with dissection. Ectatic patent left vertebral artery without significant narrowing or regularity. Mildly ectatic basilar artery without significant narrowing or regularity. Nonvisualized anterior inferior cerebellar arteries. Slight irregularity superior cerebellar arteries. Minimal narrowing proximal left posterior cerebral artery. Anterior circulation without medium or large size vessel significant stenosis or occlusion. Ectatic cavernous segment internal carotid arteries greater on left. Two tiny bulges distal M1 segment left middle cerebral artery/middle cerebral artery bifurcation appear to be origin of vessels rather than small aneurysm. Mild irregularity middle cerebral artery branch vessels may be related to limitations of the present exam. MRA NECK FINDINGS Left vertebral artery arises directly from the aortic arch. Narrowing versus artifact proximal left common carotid artery, left vertebral artery and left subclavian artery. Right vertebral artery is small with slight irregularity distal aspect raises possibility of dissection and possibly of fibromuscular dysplasia. Dominant left vertebral artery is mildly ectatic. No significant stenosis of either carotid bifurcation or either internal carotid artery. IMPRESSION: MRI HEAD Acute small nonhemorrhagic infarct inferior right cerebellum (measuring up to 1 cm). Slightly  heterogeneous bone marrow may be normal for this patient. Correlation with CBC to exclude anemia contributing to this appearance may be considered. MRA HEAD Right vertebral artery ends in a posterior inferior cerebellar artery distribution. The right vertebral artery appears irregular and narrowed. Question fibromuscular dysplasia with dissection. Nonvisualized anterior inferior cerebellar arteries. Slight irregularity superior cerebellar arteries. Minimal narrowing proximal left posterior cerebral artery. Ectatic cavernous segment internal carotid arteries greater on left. Two tiny bulges distal M1 segment left middle cerebral artery/middle cerebral artery bifurcation appear to be origin of vessels rather than small aneurysms. Mild irregularity middle cerebral artery branch vessels may be related to limitations of the present exam. MRA NECK FINDINGS Left vertebral artery arises directly from the aortic arch. Narrowing versus artifact proximal left common carotid artery, left vertebral artery and left subclavian artery. Right vertebral artery is small with slight irregularity distal aspect raises possibility of dissection and possibly of fibromuscular dysplasia. Dominant left vertebral artery is mildly ectatic. No significant stenosis of either carotid bifurcation or either internal carotid artery. These results were called by telephone at the time of interpretation on 09/09/2015  at 12:38 pm to Dr. Ritta Slot , who verbally acknowledged these results. Electronically Signed   By: Lacy Duverney M.D.   On: 09/09/2015 13:05    Micro Results      No results found for this or any previous visit (from the past 240 hour(s)).     Today   Subjective    Juel Rothman today has mild headache, no chest abdominal pain,no new weakness tingling or numbness, feels much better wants to go home today.    Objective   Blood pressure 124/70, pulse 96, temperature 98.4 F (36.9 C), temperature source Oral, resp.  rate 18, height 5' (1.524 m), weight 58.06 kg (128 lb), last menstrual period 08/22/2015, SpO2 99 %.   Intake/Output Summary (Last 24 hours) at 09/11/15 1132 Last data filed at 09/10/15 1800  Gross per 24 hour  Intake    360 ml  Output      0 ml  Net    360 ml    Exam Awake Alert, Oriented x 3, No new F.N deficits, Normal affect [place].AT,PERRAL Supple Neck,No JVD, No cervical lymphadenopathy appriciated.  Symmetrical Chest wall movement, Good air movement bilaterally, CTAB RRR,No Gallops,Rubs or new Murmurs, No Parasternal Heave +ve B.Sounds, Abd Soft, Non tender, No organomegaly appriciated, No rebound -guarding or rigidity. No Cyanosis, Clubbing or edema, No new Rash or bruise   Data Review   CBC w Diff: Lab Results  Component Value Date   WBC 11.1* 09/09/2015   HGB 14.6 09/09/2015   HCT 43.0 09/09/2015   PLT 297 09/09/2015   LYMPHOPCT 9 09/09/2015   MONOPCT 7 09/09/2015   EOSPCT 0 09/09/2015   BASOPCT 0 09/09/2015    CMP: Lab Results  Component Value Date   NA 140 09/10/2015   K 4.3 09/10/2015   CL 106 09/10/2015   CO2 25 09/10/2015   BUN 6 09/10/2015   CREATININE 0.81 09/10/2015   PROT 7.4 09/09/2015   ALBUMIN 4.4 09/09/2015   BILITOT 0.7 09/09/2015   ALKPHOS 58 09/09/2015   AST 25 09/09/2015   ALT 11* 09/09/2015   Lab Results  Component Value Date   HGBA1C 5.7* 09/10/2015    Lab Results  Component Value Date   CHOL 135 09/10/2015   HDL 49 09/10/2015   LDLCALC 76 09/10/2015   TRIG 50 09/10/2015   CHOLHDL 2.8 09/10/2015      Total Time in preparing paper work, data evaluation and todays exam - 35 minutes  Susa Raring K M.D on 09/11/2015 at 11:32 AM  [facility]   Office  [phone]

## 2015-09-11 NOTE — Evaluation (Signed)
Physical Therapy Evaluation Patient Details Name: Teresa Guzman MRN: [id number] DOB: [birth year] Today's Date: 09/11/2015   History of Present Illness  Patient is a 35 y/o female with hx of endometriosis and hypothyroidism presents with right-sided weakness and a sense of gait imbalance, s/p fall at home. 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 Lengthy discussion re: activity/exercise modification, follow up OPPT, decreasing aggravating activities and sign/symptoms of CVA. Pt verbalized understanding. Pt tolerated ambulation and stair training without difficulty or LOB. Would benefit from OPPT for higher level balance as pt does crossfit and for treatment of neck pain/dysfunction to improve functional mobility and quality of life. Pt reports all other symptoms have resolved. Discharge from therapy.    Follow Up Recommendations Outpatient PT;Supervision - Intermittent (dry needling )    Equipment Recommendations  None recommended by PT    Recommendations for Other Services       Precautions / Restrictions Precautions Precautions: None Restrictions Weight Bearing Restrictions: No      Mobility  Bed Mobility Overal bed mobility: Independent                Transfers Overall transfer level: Independent               General transfer comment: Stood from EOB x1. NO dizziness or LOB. no assist needed.  Ambulation/Gait Ambulation/Gait assistance: Independent Ambulation Distance (Feet): 400 Feet Assistive device: None Gait Pattern/deviations: WFL(Within Functional Limits) (except mild deviations in gait with head turn to the right)   Gait velocity interpretation: at or above normal speed for age/gender General Gait  Details: Steady gait. Mild deviation noted with head turn when coming back to neutral from looking left, but no LOB. Pt aware. HR up to 142 bpm. See balance section.  Stairs Stairs: Yes Stairs assistance: Modified independent (Device/Increase time) Stair Management: One rail Right Number of Stairs: 13 General stair comments: Cues to use rail for support. Reports more difficulty descending due to depth peception then ascending. No assist, no LOB.  Wheelchair Mobility    Modified Rankin (Stroke Patients Only) Modified Rankin (Stroke Patients Only) Pre-Morbid Rankin Score: No symptoms Modified Rankin: No significant disability     Balance Overall balance assessment: Needs assistance Sitting-balance support: Feet supported;No upper extremity supported Sitting balance-Leahy Scale: Normal     Standing balance support: During functional activity Standing balance-Leahy Scale: Good               High level balance activites: Head turns;Sudden stops;Turns;Direction changes;Backward walking;Side stepping High Level Balance Comments: Tolerated above without difficulty except mild conplaints of dizziness with sharp head turns. No overt LOB. Able to shuffle both sides without difficulty.  Standardized Balance Assessment Standardized Balance Assessment : Dynamic Gait Index   Dynamic Gait Index Level Surface: Normal Change in Gait Speed: Normal Gait with Horizontal Head Turns: Mild Impairment Gait with Vertical Head Turns: Normal Gait and Pivot Turn: Normal Step Over Obstacle: Normal Step Around Obstacles: Normal Steps: Mild Impairment Total Score: 22       Pertinent Vitals/Pain Pain Assessment: Faces Faces Pain Scale: Hurts even more Pain Location: right side of cervical spin Pain Descriptors / Indicators: Aching;Sore;Throbbing Pain Intervention(s): Monitored during session;Repositioned;Premedicated before session    [place] expects to be discharged to::  Private residence Living Arrangements: Spouse/significant other  Available Help at Discharge: Family;Available PRN/intermittently Type of Home: House Home Access: Stairs to enter Entrance Stairs-Rails: None Entrance Stairs-Number of Steps: 2 Home Layout: One level Home Equipment: None      Prior Function Level of Independence: Independent         Comments: Does tai kwon do; cross fit daily. Foster parent.      Hand Dominance   Dominant Hand: Right    Extremity/Trunk Assessment   Upper Extremity Assessment: Defer to OT evaluation;Overall WFL for tasks assessed           Lower Extremity Assessment: Overall WFL for tasks assessed      Cervical / Trunk Assessment:  (Decreased cervical SB, flexion, extension, and rotation.)  Communication      Cognition Arousal/Alertness: Awake/alert Behavior During Therapy: WFL for tasks assessed/performed Overall Cognitive Status: Within Functional Limits for tasks assessed                      General Comments General comments (skin integrity, edema, etc.): Pt with palpable trigger points in cervical spine Rt side> left. Reports chronic neck tightness and migraine/tension headaches. Discussed slowly increasing activity level and decreasing frequency/intensity of crossfit workouts as well as avoiding turns, spinning and fast movements until cleared by MD. Pt agreeable.     Exercises Other Exercises Other Exercises: Performed MET and STM to decrease muscle tension/tightness and to improve cervical ROM      Assessment/Plan    PT Assessment All further PT needs can be met in the next venue of care  PT Diagnosis Acute pain   PT Problem List Decreased range of motion;Decreased balance;Pain;Cardiopulmonary status limiting activity  PT Treatment Interventions     PT Goals (Current goals can be found in the Care Plan section) Acute Rehab PT Goals Patient Stated Goal: to return to PLOF PT Goal Formulation: All assessment and  education complete, DC therapy Time For Goal Achievement: 09/25/15    Frequency     Barriers to discharge        Co-evaluation               End of Session Equipment Utilized During Treatment: Gait belt Activity Tolerance: Patient tolerated treatment well Patient left: in bed;with call bell/phone within reach Nurse Communication: Mobility status         Time: 9242-6834 PT Time Calculation (min) (ACUTE ONLY): 23 min   Charges:   PT Evaluation $PT Eval Moderate Complexity: 1 Procedure PT Treatments $Gait Training: 8-22 mins   PT G Codes:        Deborra Phegley A Faizan Geraci 09/11/2015, 9:26 AM Wray Kearns, PT, DPT [phone]

## 2015-09-11 NOTE — Progress Notes (Signed)
Patient ready for discharge to home; discharge instructions given and reviewed; patient discharged out via wheelchair accompanied by her husband and children; Rx's sent electronically and outpatient appointments will be made by patient; outpatient rehab appointment arranged by case management for PT.

## 2015-09-11 NOTE — Discharge Instructions (Signed)
Follow with Primary MD [name], MD in 7 days   Get CBC, CMP, ANA, TSH checked  by Primary MD next visit.    Activity: As tolerated with Full fall precautions use walker/cane & assistance as needed   Disposition Home     Diet:   Heart Healthy  .  For Heart failure patients - Check your Weight same time everyday, if you gain over 2 pounds, or you develop in leg swelling, experience more shortness of breath or chest pain, call your Primary MD immediately. Follow Cardiac Low Salt Diet and 1.5 lit/day fluid restriction.   On your next visit with your primary care physician please Get Medicines reviewed and adjusted.   Please request your Prim.MD to go over all Hospital Tests and Procedure/Radiological results at the follow up, please get all Hospital records sent to your Prim MD by signing hospital release before you go home.   If you experience worsening of your admission symptoms, develop shortness of breath, life threatening emergency, suicidal or homicidal thoughts you must seek medical attention immediately by calling 911 or calling your MD immediately  if symptoms less severe.  You Must read complete instructions/literature along with all the possible adverse reactions/side effects for all the Medicines you take and that have been prescribed to you. Take any new Medicines after you have completely understood and accpet all the possible adverse reactions/side effects.   Do not drive, operating heavy machinery, perform activities at heights, swimming or participation in water activities or provide baby sitting services if your were admitted for syncope or siezures until you have seen by Primary MD or a Neurologist and advised to do so again.  Do not drive when taking Pain medications.    Do not take more than prescribed Pain, Sleep and Anxiety Medications  Special Instructions: If you have smoked or chewed Tobacco  in the last 2 yrs please stop smoking, stop any regular  Alcohol  and or any Recreational drug use.  Wear Seat belts while driving.   Please note  You were cared for by a hospitalist during your hospital stay. If you have any questions about your discharge medications or the care you received while you were in the hospital after you are discharged, you can call the unit and asked to speak with the hospitalist on call if the hospitalist that took care of you is not available. Once you are discharged, your primary care physician will handle any further medical issues. Please note that NO REFILLS for any discharge medications will be authorized once you are discharged, as it is imperative that you return to your primary care physician (or establish a relationship with a primary care physician if you do not have one) for your aftercare needs so that they can reassess your need for medications and monitor your lab values.

## 2015-09-14 ENCOUNTER — Telehealth: Payer: Self-pay

## 2015-09-14 NOTE — Telephone Encounter (Signed)
Spoke with Dr. Laury Axon regarding Excedrin migraine.  She advised patient not to take it, but says that she could go to UC or ER for a shot of pain medication to get her through the night.  The same was shared with patient.  She declined and said that she would wait to see Dr. Beverely Low tomorrow.

## 2015-09-14 NOTE — Telephone Encounter (Addendum)
Admission date: 09/09/2015  Discharge Date: 09/11/2015   Primary MD [name], MD  Hospital follow up appt scheduled with Dr. Beverely Low on 09/15/15 @ 11:15 am.     Transition Care Management Follow-up Telephone Call  How have you been since you were released from the hospital? Tired. Continues to have nagging headache since the stroke. Describes at a constant throbbing pain.  Rated 5/10.  Tylenol and Tramadol were given in hospital, but were not effective.  Pt would like to try an Excedrin migraine if possible.  Pt called neurologist about constant headache.  She was advised to follow up with PCP.     Do you understand why you were in the hospital? yes   Do you understand the discharge instructions? yes  Items Reviewed:  Medications reviewed: yes  Allergies reviewed: yes  Dietary changes reviewed: yes, low sodium, heart healthy diet  Referrals reviewed: yes, neurology, Outpt PT, and rheumatology   Functional Questionnaire:   Activities of Daily Living (ADLs):   She states they are independent in the following: ambulation, bathing and hygiene, feeding, continence, grooming, toileting and dressing--has family there to assist as needed.  States they require assistance with the following: none   Any transportation issues/concerns?: no, father will be bring her to her appt.    Any patient concerns? yes, headache.   Confirmed importance and date/time of follow-up visits scheduled: yes   Confirmed with patient if condition begins to worsen call PCP or go to the ER: yes

## 2015-09-15 ENCOUNTER — Encounter: Payer: Self-pay | Admitting: Family Medicine

## 2015-09-15 ENCOUNTER — Ambulatory Visit (AMBULATORY_SURGERY_CENTER): Payer: Self-pay | Admitting: *Deleted

## 2015-09-15 ENCOUNTER — Telehealth: Payer: Self-pay | Admitting: Family Medicine

## 2015-09-15 ENCOUNTER — Ambulatory Visit (INDEPENDENT_AMBULATORY_CARE_PROVIDER_SITE_OTHER): Payer: BLUE CROSS/BLUE SHIELD | Admitting: Family Medicine

## 2015-09-15 ENCOUNTER — Other Ambulatory Visit: Payer: Self-pay

## 2015-09-15 VITALS — BP 118/82 | HR 103 | Temp 98.1°F | Ht 60.0 in | Wt 129.2 lb

## 2015-09-15 VITALS — Ht 60.0 in | Wt 130.0 lb

## 2015-09-15 DIAGNOSIS — Z800 Family history of malignant neoplasm of digestive organs: Secondary | ICD-10-CM

## 2015-09-15 DIAGNOSIS — I7774 Dissection of vertebral artery: Secondary | ICD-10-CM | POA: Diagnosis not present

## 2015-09-15 DIAGNOSIS — M62838 Other muscle spasm: Secondary | ICD-10-CM

## 2015-09-15 DIAGNOSIS — M6248 Contracture of muscle, other site: Secondary | ICD-10-CM

## 2015-09-15 DIAGNOSIS — R768 Other specified abnormal immunological findings in serum: Secondary | ICD-10-CM | POA: Diagnosis not present

## 2015-09-15 DIAGNOSIS — I63111 Cerebral infarction due to embolism of right vertebral artery: Secondary | ICD-10-CM | POA: Diagnosis not present

## 2015-09-15 LAB — HEPATIC FUNCTION PANEL
ALT: 6 U/L (ref 0–35)
AST: 15 U/L (ref 0–37)
Albumin: 4.2 g/dL (ref 3.5–5.2)
Alkaline Phosphatase: 60 U/L (ref 39–117)
Bilirubin, Direct: 0.1 mg/dL (ref 0.0–0.3)
Total Bilirubin: 0.2 mg/dL (ref 0.2–1.2)
Total Protein: 7.4 g/dL (ref 6.0–8.3)

## 2015-09-15 LAB — BASIC METABOLIC PANEL
BUN: 14 mg/dL (ref 6–23)
CO2: 29 mEq/L (ref 19–32)
Calcium: 9.3 mg/dL (ref 8.4–10.5)
Chloride: 102 mEq/L (ref 96–112)
Creatinine, Ser: 0.77 mg/dL (ref 0.40–1.20)
GFR: 90.02 mL/min (ref >60.00)
Glucose, Bld: 111 mg/dL — ABNORMAL HIGH (ref 70–99)
Potassium: 3.9 mEq/L (ref 3.5–5.1)
Sodium: 138 mEq/L (ref 135–145)

## 2015-09-15 LAB — CBC WITH DIFFERENTIAL/PLATELET
Basophils Absolute: 0 10*3/uL (ref 0.0–0.1)
Basophils Relative: 0.8 % (ref 0.0–3.0)
Eosinophils Absolute: 0.1 10*3/uL (ref 0.0–0.7)
Eosinophils Relative: 2.7 % (ref 0.0–5.0)
HCT: 36.7 % (ref 36.0–46.0)
Hemoglobin: 12 g/dL (ref 12.0–15.0)
Lymphocytes Relative: 25 % (ref 12.0–46.0)
Lymphs Abs: 1.4 10*3/uL (ref 0.7–4.0)
MCHC: 32.8 g/dL (ref 30.0–36.0)
MCV: 89.3 fl (ref 78.0–100.0)
Monocytes Absolute: 0.4 10*3/uL (ref 0.1–1.0)
Monocytes Relative: 7.7 % (ref 3.0–12.0)
Neutro Abs: 3.5 10*3/uL (ref 1.4–7.7)
Neutrophils Relative %: 63.8 % (ref 43.0–77.0)
Platelets: 246 10*3/uL (ref 150.0–400.0)
RBC: 4.12 Mil/uL (ref 3.87–5.11)
RDW: 14.1 % (ref 11.5–15.5)
WBC: 5.5 10*3/uL (ref 4.0–10.5)

## 2015-09-15 LAB — TSH: TSH: 1.68 u[IU]/mL (ref 0.35–4.50)

## 2015-09-15 MED ORDER — CYCLOBENZAPRINE HCL 10 MG PO TABS: 10.0000 mg | ORAL_TABLET | Freq: Three times a day (TID) | ORAL | Status: DC | PRN

## 2015-09-15 NOTE — Assessment & Plan Note (Signed)
New.  There is some question if pt has fibromuscular dysplasia, SLE or other predisposing condition given her recent embolic stroke.  Referral to Rheum placed.  Will follow along.

## 2015-09-15 NOTE — Assessment & Plan Note (Signed)
New.  Pt is not able to take NSAIDs due to ASA, Plavix combo.  Will start flexeril as pt continues to have neck pain and HA.  Pt to start dry needle therapy to improve pain sxs.  Will follow along.

## 2015-09-15 NOTE — Telephone Encounter (Signed)
Pt says that she uses a different pharmacy now. Her Rx should go to [facility] on [address]    CB: [phone]

## 2015-09-15 NOTE — Patient Outreach (Signed)
[facility]) Care Management  09/15/2015  Teresa Guzman 1981/04/06 [id number]   SUBJECTIVE: Telephone call to patient regarding EMMI stroke referral. HIPAA verified with patient.  Reviewed EMMI stroke program with patient. Patient verbally agreed to follow up calls from [facility].  Patient states she has been getting nauseated. Patient states she thinks this is due to her medications. Patient states she saw her primary MD today.  Patient states she made her doctor aware of her nausea. Patient states her doctor reviewed her medications with her.  Patient states she has also had headaches for the last 7 days. Patient states her doctor gave her a prescription for flexeril that she will begin taking this evening. Patient states her outpatient therapy will start on tomorrow 09/16/15. Patient states she will not be following up with Dr. [name], neurologist. Patient states her primary MD will refer her to [facility] neurology and will set up appointment for her to see Dr. Bedelia Person.   RNCM advised patient to continue to take her medications as prescribed and notify her doctor of any unusual symptoms.  RNCM discussed signs and symptoms of stroke with patient and advised patient to call 911 for stroke like symptoms.  Patient states she knows how to contact her primary MD after hours for concerns.   ASSESSMENT:  EMMI stroke transition program.   PLAN; RNCM will follow up with patient within 1 week. Patient will report to nurse when she receives appointments for neurologist and Dr. Bedelia Person follow up.   George Ina RN,BSN,CCM [facility] Telephonic  [phone]

## 2015-09-15 NOTE — Progress Notes (Signed)
Patient came in today to pre-visit for recall colonoscopy due to family hx colon cancer(brother). She told me that she has had a "stroke" on 09/09/15 and was put on Plavix. Cancelled [facility] colonoscopy and made patient office visit with Lori,PA ( first available time per pt's request ). Pt aware and appointment information given to her at this time.

## 2015-09-15 NOTE — Patient Outreach (Signed)
[facility]) Care Management  09/15/2015  Teresa Guzman [birth year] [id number]   Telephone call to patient regarding EMMI stroke transition program referral. Unable to reach patient. HIPAA compliant voice message left with call back phone number.   PLAN:  RNCM will attempt 2nd telephone outreach to patient within 1 week.   George Ina RN,BSN,CCM [facility] Telephonic  [phone]

## 2015-09-15 NOTE — Progress Notes (Signed)
Pre visit review using our clinic review tool, if applicable. No additional management support is needed unless otherwise documented below in the visit note. 

## 2015-09-15 NOTE — Assessment & Plan Note (Signed)
New to provider.  Pt has regained strength and mobility on R side.  Now on ASA and plavix.  Pt prefers to f/u w/ [facility]- referral placed.  Will follow along and assist as able.

## 2015-09-15 NOTE — Progress Notes (Signed)
   Subjective:    Patient ID: Teresa Guzman, female    DOB: [birth year], 35 y.o.   MRN: [id number]  HPI Hospital f/u- pt was admitted 2/2-2/4 w/ R sided weakness and 'falling to the R'.  Was found to have R vertebral artery dissection and embolic stroke to R vertebral artery.   Pt reports strength and motion have returned to R side.  Pt has severe R sided trap spasm which is causing constant throbbing in head.  Needs rheum referral for + Rheum.  Needs neuro referral for f/u- pt prefers not to f/u w/ doctor from hospital.  Currently on ASA and plavix.  D/c summary reviewed.   Review of Systems For ROS see HPI     Objective:   Physical Exam  Constitutional: She is oriented to person, place, and time. She appears well-developed and well-nourished. No distress.  HENT:  Head: Normocephalic and atraumatic.  Eyes: Conjunctivae and EOM are normal. Pupils are equal, round, and reactive to light.  Neck:  + trap spasm bilaterally Difficulty turning head to R  Cardiovascular: Normal rate, regular rhythm and normal heart sounds.   Pulmonary/Chest: Effort normal and breath sounds normal. No respiratory distress. She has no wheezes. She has no rales.  Neurological: She is alert and oriented to person, place, and time. She has normal reflexes. No cranial nerve deficit. Coordination normal.  Skin: Skin is warm and dry.  Psychiatric: She has a normal mood and affect. Her behavior is normal. Thought content normal.  Vitals reviewed.         Assessment & Plan:

## 2015-09-15 NOTE — Assessment & Plan Note (Signed)
New.  Cause of pt's recent stroke.  Refer to neuro.

## 2015-09-15 NOTE — Patient Instructions (Signed)
Follow up in 6 months- sooner if needed We'll notify you of your lab results and make any changes if needed We'll call you with your neuro and rheumatology appts Continue the Atorvastatin nightly Aspirin and Plavix when it's convenient- but at same time daily Call with any questions or concerns Hang in there!!!

## 2015-09-16 ENCOUNTER — Ambulatory Visit: Payer: BLUE CROSS/BLUE SHIELD | Attending: Internal Medicine | Admitting: Rehabilitation

## 2015-09-16 ENCOUNTER — Encounter: Payer: Self-pay | Admitting: Rehabilitation

## 2015-09-16 DIAGNOSIS — I63541 Cerebral infarction due to unspecified occlusion or stenosis of right cerebellar artery: Secondary | ICD-10-CM | POA: Insufficient documentation

## 2015-09-16 DIAGNOSIS — H832X1 Labyrinthine dysfunction, right ear: Secondary | ICD-10-CM | POA: Diagnosis present

## 2015-09-16 DIAGNOSIS — G729 Myopathy, unspecified: Secondary | ICD-10-CM | POA: Diagnosis present

## 2015-09-16 DIAGNOSIS — R42 Dizziness and giddiness: Secondary | ICD-10-CM | POA: Diagnosis not present

## 2015-09-16 DIAGNOSIS — M436 Torticollis: Secondary | ICD-10-CM

## 2015-09-16 DIAGNOSIS — M6289 Other specified disorders of muscle: Secondary | ICD-10-CM

## 2015-09-16 NOTE — Patient Instructions (Addendum)
Visuo-Vestibular: Head / Eyes Moving in Same Direction    Holding a target, keep eyes on target and slowly move target, head, and eyes in SAME direction side to side for __10__ seconds. Perform sitting. Repeat _3___ times per session. Do __2__ sessions per day.    Start slow, use your symptoms as a guide.  If you start with 0/10 dizziness, don't allow yourself to get higher than 3/10 (3 points).  If you start to get higher than 3 points, take a break and walk around.    Start horizontally (first picture), then do vertically, then in diagonals (both directions).    Copyright  VHI. All rights reserved.

## 2015-09-16 NOTE — Telephone Encounter (Signed)
Pharmacy changed in system.  

## 2015-09-16 NOTE — Therapy (Signed)
[facility] [address] [place], [place], [address] Phone: [phone]   Fax:  [phone]  Physical Therapy Evaluation  Patient Details  Name: Teresa Guzman MRN: [id number] Date of Birth: 1980-11-05 Referring Provider: Lala Lund, MD  Encounter Date: 09/16/2015      PT End of Session - 09/16/15 1007    Visit Number 1   Number of Visits 9   Date for PT Re-Evaluation 10/16/15   Authorization Type [employer] 30 visit limit PT/OT, 3500 ded, 100 met   Authorization - Visit Number 1   Authorization - Number of Visits 30   PT Start Time 0847   PT Stop Time 0935   PT Time Calculation (min) 48 min   Activity Tolerance Patient tolerated treatment well   Behavior During Therapy [facility] for tasks assessed/performed      Past Medical History  Diagnosis Date  . Endometriosis   . Hypothyroidism   . Stroke ([place]) 09/09/15    had CVA per pt.  . Asthma     Past Surgical History  Procedure Laterality Date  . Arm surgery Left 1987  . Cyst removal hand      There were no vitals filed for this visit.  Visit Diagnosis:  Dizziness and giddiness - Plan: PT plan of care cert/re-cert  Vestibular dysfunction, right - Plan: PT plan of care cert/re-cert  Cerebrovascular accident (CVA) due to occlusion of right cerebellar artery ([place]) - Plan: PT plan of care cert/re-cert  Muscle tightness - Plan: PT plan of care cert/re-cert  Neck stiffness - Plan: PT plan of care cert/re-cert      Subjective Assessment - 09/16/15 0855    Subjective "I had a stroke last Thursday.  I was at the gym sitting in the parking lot and my world turned sideways to the R.  I did the work out, but it was the worst workout.  I went home and then my husband came home and I was in the floor in the bathroom."    Limitations Walking   Currently in Pain? Yes   Pain Score 6    Pain Location Neck   Pain Orientation Right   Pain Descriptors / Indicators Throbbing   Pain Type Chronic pain   Pain Onset More than a month ago   Pain Frequency Constant   Aggravating Factors  Turning neck   Pain Relieving Factors nothing really            [facility] PT Assessment - 09/16/15 0001    Assessment   Medical Diagnosis R Cerebellar CVA   Referring Provider Lala Lund, MD   Onset Date/Surgical Date 09/09/15   Restrictions   Weight Bearing Restrictions No   Balance Screen   Has the patient fallen in the past 6 months No   Has the patient had a decrease in activity level because of a fear of falling?  No   Is the patient reluctant to leave their home because of a fear of falling?  No   Home Ecologist residence   Living Arrangements Spouse/significant other;Other relatives   Available Help at Discharge Family;Available 24 hours/day  father in town til Saturday, husband works   Type of [place] to enter   [employer] of Steps 2   [place] One level   [place] None   Prior Function   Level of Independence Independent  Vocation Full time employment   Vocation Requirements plans to return next Wednesday, works a Network engineer job, needs to use computer   Leisure does crossfit, tai kwon do, foster parent (94 month old, 2 y/o, 31 y/o)   Cognition   Overall Cognitive Status Impaired/Different from baseline   Area of Impairment --  states she is not as "sharp" but improving   Sensation   Light Touch Impaired Detail   Light Touch Impaired Details Impaired RUE;Impaired RLE  reports some tingling, but not often   Hot/Cold Appears Intact   Proprioception Impaired by gross assessment  states she sometimes over/under shoots   Coordination   Gross Motor Movements are Fluid and Coordinated Yes  in LEs   Fine Motor Movements are Fluid and Coordinated Yes  LEs   Finger Nose Finger Test [facility]   Heel Shin Test [facility]   Posture/Postural Control   Posture/Postural Control  Postural limitations   Posture Comments Note she has history of tight R upper trap tightness   ROM / Strength   AROM / PROM / Strength Strength   Strength   Overall Strength Within functional limits for tasks performed   Transfers   Transfers Sit to Stand;Stand to Sit   Sit to Stand 7: Independent   Stand to Sit 7: Independent   Ambulation/Gait   Ambulation/Gait Yes   Ambulation/Gait Assistance 7: Independent   Ambulation Distance (Feet) 345 Feet   Assistive device None   Gait Pattern Within Functional Limits   Ambulation Surface Level;Indoor   Gait velocity 4.13 ft/sec   Stairs Yes   Stairs Assistance 6: Modified independent (Device/Increase time)   Stair Management Technique One rail Right;Alternating pattern;Forwards   Number of Stairs 4   Height of Stairs 6   Functional Gait  Assessment   Gait assessed  Yes   Gait Level Surface Walks 20 ft in less than 5.5 sec, no assistive devices, good speed, no evidence for imbalance, normal gait pattern, deviates no more than 6 in outside of the 12 in walkway width.   Change in Gait Speed Able to smoothly change walking speed without loss of balance or gait deviation. Deviate no more than 6 in outside of the 12 in walkway width.   Gait with Horizontal Head Turns Performs head turns smoothly with slight change in gait velocity (eg, minor disruption to smooth gait path), deviates 6-10 in outside 12 in walkway width, or uses an assistive device.   Gait with Vertical Head Turns Performs task with slight change in gait velocity (eg, minor disruption to smooth gait path), deviates 6 - 10 in outside 12 in walkway width or uses assistive device   Gait and Pivot Turn Pivot turns safely within 3 sec and stops quickly with no loss of balance.   Step Over Obstacle Is able to step over 2 stacked shoe boxes taped together (9 in total height) without changing gait speed. No evidence of imbalance.   Gait with Narrow Base of Support Is able to ambulate for 10  steps heel to toe with no staggering.   Gait with Eyes Closed Walks 20 ft, uses assistive device, slower speed, mild gait deviations, deviates 6-10 in outside 12 in walkway width. Ambulates 20 ft in less than 9 sec but greater than 7 sec.   Ambulating Backwards Walks 20 ft, uses assistive device, slower speed, mild gait deviations, deviates 6-10 in outside 12 in walkway width.   Steps Alternating feet, must use rail.   Total Score 25  Vestibular Assessment - 09/16/15 0001    Symptom Behavior   Type of Dizziness Comment  reports felt like "world tilted R"   Aggravating Factors Turning body quickly;Turning head quickly;Sitting in moving car;Turning head sideways   Relieving Factors Closing eyes;Rest;Slow movements   Occulomotor Exam   Occulomotor Alignment Normal   Spontaneous Absent   Gaze-induced Absent   Smooth Pursuits Comment  decreased smoothness, but grossly WFL   Saccades Intact   Vestibulo-Occular Reflex   VOR 1 Head Only (x 1 viewing) horizontal normal, vertical R eye demonstrates poor trajectory   VOR 2 Head and Object (x 2 viewing) frog jumping noted in R eye                       PT Education - 09/16/15 1005    Education provided Yes   Education Details evaluation findings, vestibular findings w/ HEP exercise, PT to call [employer] regarding dry needling and vestibular rehab combo   Person(s) Educated Patient   Methods Explanation;Handout   Comprehension Verbalized understanding             PT Long Term Goals - 09/16/15 1228    PT LONG TERM GOAL #1   Title Pt will be independent with HEP in order to indicate improved functional mobility and decreased dizziness. (Target Date: 10/14/15)   PT LONG TERM GOAL #2   Title Pt will report no more than 2/10 dizziness with functional mobility involving head turns to indicate vestibular adaptation.     PT LONG TERM GOAL #3   Title Pt will report return to community activities without limitation  of dizziness.     PT LONG TERM GOAL #4   Title Pt will ambulate over varying outdoor surfaces at mod I level in order to demonstrate safe return to community and leisure activities.                 Plan - 09/16/15 1013    Clinical Impression Statement Pt presents with R cerebellar CVA with resulting high level vestibular deficits and reports of increased pain and tightness in R upper trap muscle.  Note that she has had this tightness premorbidly, however has since gotten much worse and she has increased difficulty turning head.  Note that hospital MD feels this may have caused compression on vessels leading to CVA and this is pts biggest concern.  Gait speed [facility] and note no glaring balance deficits, other than high level vestibula/VOR deficits.  Note no co-morbidiites that would impair pts ability to progress and pt is of relatively low complexity.    Pt will benefit from skilled therapeutic intervention in order to improve on the following deficits Decreased balance;Decreased coordination;Dizziness;Impaired flexibility;Postural dysfunction;Pain   Rehab Potential Excellent   PT Frequency 2x / week   PT Duration 4 weeks   PT Treatment/Interventions ADLs/Self Care Home Management;Dry needling;Vestibular;Patient/family education;Neuromuscular re-education;Balance training;Therapeutic activities;Functional mobility training;Gait training;Therapeutic exercise;Manual techniques;Moist Heat;Ultrasound;Electrical Stimulation   PT Next Visit Plan est improved HEP for vestibular deficits, dry needling to upper trap if appropriate-please add goals that address this Raquel Sarna not sure what goals appropriate).    Consulted and Agree with Plan of Care Patient         Problem List Patient Active Problem List   Diagnosis Date Noted  . Trapezius muscle spasm 09/15/2015  . Positive ANA (antinuclear antibody) 09/15/2015  . Cerebral infarction due to embolism of right vertebral artery ([place])   . CVA  (cerebral infarction) 09/09/2015  .  Asthma 09/09/2015  . AKI (acute kidney injury) ([place]) 09/09/2015  . Thrombotic stroke involving right cerebellar artery ([place]) 09/09/2015  . Vertebral artery dissection ([place])   . Pap smear for cervical cancer screening 07/21/2015  . Right foot infection 06/21/2015  . Acute bacterial bronchitis 02/26/2015  . Influenza A 09/29/2014  . Acute bacterial sinusitis 09/29/2014  . Paronychia of third finger of right hand 05/06/2014  . Cough variant asthma 05/15/2013  . Left foot pain 12/13/2012  . Traumatic loss of toenail 12/11/2012  . Sinusitis 09/13/2012  . Rash and nonspecific skin eruption 03/25/2012  . Superficial bruising of thigh 03/25/2012  . Dermatitis 02/23/2012  . Screening for malignant neoplasm of the cervix 12/14/2010  . Physical exam 12/14/2010  . Infertility, female 12/14/2010  . OTHER AND UNSPECIFIED OVARIAN CYST 05/10/2010  . VITAMIN D DEFICIENCY 05/06/2010  . GAS/BLOATING 05/06/2010  . UTI 11/17/2009  . ACUTE BRONCHITIS 08/13/2009  . NAUSEA 08/13/2009  . POLYDIPSIA 05/13/2009  . INJURY OF FACE AND NECK OTHER AND UNSPECIFIED 03/19/2009  . KNEE SPRAIN, LEFT, MEDIAL COLLATERAL LIGAMENT 11/06/2008  . FOOT, PAIN 10/29/2008  . Hypothyroidism 10/02/2008  . ANEMIA-NOS 10/02/2008  . ADD 10/02/2008  . MIGRAINE 10/02/2008  . RHINITIS 10/02/2008  . ABDOMINAL PAIN 10/02/2008  . BIPOLAR AFFECTIVE DISORDER, HX OF 10/02/2008    Cameron Sprang, PT, MPT [facility] [address] [place], [place], [address] Phone: [phone]   Fax:  [phone] 09/16/2015, 12:44 PM  Name: DELAYNI STREED MRN: [id number] Date of Birth: 10/01/1980

## 2015-09-20 ENCOUNTER — Telehealth: Payer: Self-pay | Admitting: Family Medicine

## 2015-09-20 ENCOUNTER — Ambulatory Visit: Payer: BLUE CROSS/BLUE SHIELD | Attending: Internal Medicine | Admitting: Physical Therapy

## 2015-09-20 DIAGNOSIS — G729 Myopathy, unspecified: Secondary | ICD-10-CM | POA: Diagnosis not present

## 2015-09-20 DIAGNOSIS — I63541 Cerebral infarction due to unspecified occlusion or stenosis of right cerebellar artery: Secondary | ICD-10-CM | POA: Insufficient documentation

## 2015-09-20 DIAGNOSIS — H832X1 Labyrinthine dysfunction, right ear: Secondary | ICD-10-CM | POA: Diagnosis present

## 2015-09-20 DIAGNOSIS — M436 Torticollis: Secondary | ICD-10-CM

## 2015-09-20 DIAGNOSIS — R42 Dizziness and giddiness: Secondary | ICD-10-CM | POA: Diagnosis present

## 2015-09-20 DIAGNOSIS — M6289 Other specified disorders of muscle: Secondary | ICD-10-CM

## 2015-09-20 NOTE — Telephone Encounter (Signed)
Received Disability Forms from [employer] [employer], filled out as much as possible; forwarded to provider/SLS 02/13

## 2015-09-20 NOTE — Therapy (Signed)
[facility] [address]  [address] [place], [place], [address] Phone: [phone]   Fax:  [phone]  Physical Therapy Treatment  Patient Details  Name: Teresa Guzman MRN: [id number] Date of Birth: [birth year] Referring Provider: Susa Raring, MD  Encounter Date: 09/20/2015      PT End of Session - 09/20/15 1029    Visit Number 2   Number of Visits 9   Date for PT Re-Evaluation 10/16/15   PT Start Time 1025   PT Stop Time 1100   PT Time Calculation (min) 35 min      Past Medical History  Diagnosis Date  . Endometriosis   . Hypothyroidism   . Stroke ([facility]) 09/09/15    had CVA per pt.  . Asthma     Past Surgical History  Procedure Laterality Date  . Arm surgery Left 1987  . Cyst removal hand      There were no vitals filed for this visit.  Visit Diagnosis:  Muscle tightness  Neck stiffness      Subjective Assessment - 09/20/15 1033    Subjective Pt here for Dry Needling to R neck and upper scapular muscles due to chronic tightness and pain in the area.  She experienced CVA 2 weeks ago and is progressing exceptionally well from this to date.   Pertinent History chonic migraines   Currently in Pain? Yes   Pain Score 4   with neck rotation   Pain Location Neck   Pain Orientation Right        TODAY'S TREATMENT Manual - pt with tenderness and elevated resting tone noted throughout R upper trapezius and into subocip area.  Dry needling (verbal informed consent given prior to treatment) performed throughout R upper trapezius with pt prone.  Most tenderness noted to lateral aspect of trapezius and good twitch response noted here as well.  Then performed dry needling to R upper c-spine paraspinals and subocip mms due to tenderness in this area.   Following Dry needling, one strip of Kinesiotape applied from lateral delt into upper trap at 50% stretch.            PT Long Term Goals - 09/20/15 1445    PT  LONG TERM GOAL #1   Title Pt will be independent with HEP in order to indicate improved functional mobility and decreased dizziness. (Target Date: 10/14/15)   Status On-going   PT LONG TERM GOAL #2   Title Pt will report no more than 2/10 dizziness with functional mobility involving head turns to indicate vestibular adaptation.     Status On-going   PT LONG TERM GOAL #3   Title Pt will report return to community activities without limitation of dizziness.     Status On-going   PT LONG TERM GOAL #4   Title Pt will ambulate over varying outdoor surfaces at mod I level in order to demonstrate safe return to community and leisure activities.     Status On-going   PT LONG TERM GOAL #5   Title pt displays normal c-spine AROM without limitation by neck pain by 10/14/15   Status New               Plan - 09/20/15 1441    Clinical Impression Statement pt transfered to this clinic for dry needling treatments in addition to treating for high level vestibular deficits.  Today's treatment was dry needling to R upper trapezius and into R upper c-spine mms.  Tolerated well and good twitch response noted in UT.  Kinesiotape applied following treatment to further assist with normalizing tone.   PT Next Visit Plan est improved HEP for vestibular deficits, dry needling and modalities/exercises to normalize R upper trap tone   Consulted and Agree with Plan of Care Patient        Problem List Patient Active Problem List   Diagnosis Date Noted  . Trapezius muscle spasm 09/15/2015  . Positive ANA (antinuclear antibody) 09/15/2015  . 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 CVA (cerebral infarction) 09/09/2015  . Asthma 09/09/2015  . AKI (acute kidney injury) ([facility]) 09/09/2015  . 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 Vertebral artery dissection ([facility])   . Pap smear for cervical cancer screening 07/21/2015  . Right foot infection  06/21/2015  . Acute bacterial bronchitis 02/26/2015  . Influenza A 09/29/2014  . Acute bacterial sinusitis 09/29/2014  . Paronychia of third finger of right hand 05/06/2014  . Cough variant asthma 05/15/2013  . Left foot pain 12/13/2012  . Traumatic loss of toenail 12/11/2012  . Sinusitis 09/13/2012  . Rash and nonspecific skin eruption 03/25/2012  . Superficial bruising of thigh 03/25/2012  . Dermatitis 02/23/2012  . Screening for malignant neoplasm of the cervix 12/14/2010  . Physical exam 12/14/2010  . Infertility, female 12/14/2010  . OTHER AND UNSPECIFIED OVARIAN CYST 05/10/2010  . VITAMIN D DEFICIENCY 05/06/2010  . GAS/BLOATING 05/06/2010  . UTI 11/17/2009  . ACUTE BRONCHITIS 08/13/2009  . NAUSEA 08/13/2009  . POLYDIPSIA 05/13/2009  . INJURY OF FACE AND NECK OTHER AND UNSPECIFIED 03/19/2009  . KNEE SPRAIN, LEFT, MEDIAL COLLATERAL LIGAMENT 11/06/2008  . FOOT, PAIN 10/29/2008  . Hypothyroidism 10/02/2008  . ANEMIA-NOS 10/02/2008  . ADD 10/02/2008  . MIGRAINE 10/02/2008  . RHINITIS 10/02/2008  . ABDOMINAL PAIN 10/02/2008  . BIPOLAR AFFECTIVE DISORDER, HX OF 10/02/2008    Teresa Guzman PT, OCS 09/20/2015, 2:48 PM  [facility] [address]  [address] [place], [place], [address] Phone: [phone]   Fax:  [phone]  Name: Teresa Guzman MRN: [id number] Date of Birth: [birth year]

## 2015-09-20 NOTE — Telephone Encounter (Signed)
Pt dropped off paperwork to be filled out (Short Term Diability - [employer]-Non FMLA other Leave of Absence)

## 2015-09-21 ENCOUNTER — Ambulatory Visit: Payer: BLUE CROSS/BLUE SHIELD | Admitting: Physical Therapy

## 2015-09-21 ENCOUNTER — Encounter: Payer: BLUE CROSS/BLUE SHIELD | Admitting: Physician Assistant

## 2015-09-21 ENCOUNTER — Encounter: Payer: Self-pay | Admitting: Physician Assistant

## 2015-09-21 DIAGNOSIS — M436 Torticollis: Secondary | ICD-10-CM

## 2015-09-21 DIAGNOSIS — M6289 Other specified disorders of muscle: Secondary | ICD-10-CM

## 2015-09-21 DIAGNOSIS — I63541 Cerebral infarction due to unspecified occlusion or stenosis of right cerebellar artery: Secondary | ICD-10-CM

## 2015-09-21 DIAGNOSIS — R42 Dizziness and giddiness: Secondary | ICD-10-CM

## 2015-09-21 DIAGNOSIS — G729 Myopathy, unspecified: Secondary | ICD-10-CM | POA: Diagnosis not present

## 2015-09-21 DIAGNOSIS — H832X1 Labyrinthine dysfunction, right ear: Secondary | ICD-10-CM

## 2015-09-21 NOTE — Progress Notes (Signed)
Patient ID: Teresa Guzman, female   DOB: [birth year], 35 y.o.   MRN: [id number] [name] had an appointment in the office today to schedule a surveillance colonoscopy. She had a colonoscopy 5 years ago at which time no polyps or cancers were noted, and she recently received a card in the mail that she is due for surveillance. She has a brother who was diagnosed with colon cancer at age 69. [name] had a stroke on February 2 and has been started on Plavix and aspirin. Fortunately, she feels well and has no residual deficits from her stroke. She is scheduled to see Dr. Everlena Cooper of neurology on February 22. Due to her recent stroke and the fact that she was recently started on an antiplatelet agent, we have opted to hold off on her colonoscopy. She states she was told in the hospital that she may not need to stay on Plavix long-term. She will see her neurologist on February 22, and if her Plavix is discontinued she will contact us to schedule her colonoscopy. If however she has to stay on her Plavix for 6 months or longer, she will inform us of that and will be put on the recall list at a time when her Plavix can be safely discontinued.  [name], PA-C

## 2015-09-21 NOTE — Therapy (Signed)
[facility] [address]  [place], [place], [address] Phone: [phone]   Fax:  [phone]  Physical Therapy Treatment  Patient Details  Name: Teresa Guzman MRN: [id number] Date of Birth: Jan 21, 1981 Referring Provider: Lala Lund, MD  Encounter Date: 09/21/2015      PT End of Session - 09/21/15 0818    Visit Number 3   Number of Visits 9   Date for PT Re-Evaluation 10/16/15   Authorization Type [employer] 30 visit limit PT/OT, 3500 ded, 100 met   Authorization - Visit Number 3   Authorization - Number of Visits 30   PT Start Time 0715   PT Stop Time 0753   PT Time Calculation (min) 38 min   Activity Tolerance Patient tolerated treatment well   Behavior During Therapy [facility] for tasks assessed/performed      Past Medical History  Diagnosis Date  . Endometriosis   . Hypothyroidism   . Stroke ([place]) 09/09/15    had CVA per pt.  . Asthma     Past Surgical History  Procedure Laterality Date  . Arm surgery Left 1987  . Cyst removal hand      There were no vitals filed for this visit.  Visit Diagnosis:  Muscle tightness  Neck stiffness  Dizziness and giddiness  Vestibular dysfunction, right  Cerebrovascular accident (CVA) due to occlusion of right cerebellar artery (HCC)      Subjective Assessment - 09/21/15 0716    Subjective Up/down exercises increase dizziness; up to 5/10 but has a hard time focusing.   Currently in Pain? No/denies                Vestibular Assessment - 09/21/15 0717    Vestibulo-Occular Reflex   VOR 1 Head Only (x 1 viewing) occasional catch up saccades with both horizontal and vertical   VOR 2 Head and Object (x 2 viewing) saccades with both directions; difficulty coordinating movements without increase in symptoms   VOR Cancellation Normal                 [facility] Adult PT Treatment/Exercise - 09/21/15 0816    Self-Care   Self-Care Other Self-Care Comments   Other Self-Care Comments  goals of vestibular exercises; gaze stabilization and habituation.  Recommended pt trial supine to sit x 5 reps 1-2 times per day to see if symptoms improve (will address next vestibular session); discussed gradual return to exercises and to start with light low impact activities and to stop if symptoms develop         Vestibular Treatment/Exercise - 09/21/15 0811    Vestibular Treatment/Exercise   Vestibular Treatment Provided Gaze   Gaze Exercises X1 Viewing Horizontal;X1 Viewing Vertical;X2 Viewing Vertical;X2 Viewing Horizontal;Eye/Head Exercise Horizontal;Eye/Head Exercise Vertical   X1 Viewing Horizontal   Foot Position sitting; feet together compliant   Time --  30 sec   Reps 3   Comments sitting x 1; feet together on compliant surface: x1 solid background; x1 busy background   X1 Viewing Vertical   Foot Position sitting; feet together compliant   Time --  30 sec   Reps 3   Comments sitting x 1; feet together on compliant surface: x1 solid background; x1 busy background   X2 Viewing Horizontal   Foot Position sitting   Time --  30   Reps 1    Comments difficulty coordinating but no increase in syptoms   X2 Viewing Vertical   Foot  Position sitting   Time --  30 sec   Reps 1   Comments difficulty coordinating but no increase in syptoms   Eye/Head Exercise Horizontal   Foot Position walking   Time --  20 sec (40' x 2)   Reps 1   Comments x1 gait   Eye/Head Exercise Vertical   Foot Position walking   Time --  20 sec (40' x 2)   Reps 1   Comments x1 gait            Balance Exercises - 09/21/15 0815    Balance Exercises: Standing   Other Standing Exercises gait with ball toss horizontal/vertical; standing on compliant surface with ball toss horizontal and trunk rotation           PT Education - 09/21/15 0818    Education provided Yes   Education Details HEP; gradual return to exercise   Person(s) Educated Patient   Methods  Explanation;Demonstration;Handout   Comprehension Verbalized understanding;Returned demonstration             PT Long Term Goals - 09/20/15 1445    PT LONG TERM GOAL #1   Title Pt will be independent with HEP in order to indicate improved functional mobility and decreased dizziness. (Target Date: 10/14/15)   Status On-going   PT LONG TERM GOAL #2   Title Pt will report no more than 2/10 dizziness with functional mobility involving head turns to indicate vestibular adaptation.     Status On-going   PT LONG TERM GOAL #3   Title Pt will report return to community activities without limitation of dizziness.     Status On-going   PT LONG TERM GOAL #4   Title Pt will ambulate over varying outdoor surfaces at mod I level in order to demonstrate safe return to community and leisure activities.     Status On-going   PT LONG TERM GOAL #5   Title pt displays normal c-spine AROM without limitation by neck pain by 10/14/15   Status New               Plan - 09/21/15 0819    Clinical Impression Statement Pt with mild increase in symptoms with full field stimulus and gait with x1 viewing exercises.  Overall vertigo continues to improve with symptoms decreasing daily.     PT Next Visit Plan dry needling and modalities/exercises to normalize R upper trap tone, habituation and gaze stabilization as needed   Consulted and Agree with Plan of Care Patient        Problem List Patient Active Problem List   Diagnosis Date Noted  . Trapezius muscle spasm 09/15/2015  . Positive ANA (antinuclear antibody) 09/15/2015  . 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 CVA (cerebral infarction) 09/09/2015  . Asthma 09/09/2015  . AKI (acute kidney injury) ([place]) 09/09/2015  . Thrombotic stroke involving right cerebellar artery ([place]) 09/09/2015  . Vertebral artery dissection ([place])   . Pap smear for cervical cancer screening 07/21/2015  . Right foot infection 06/21/2015  .  Acute bacterial bronchitis 02/26/2015  . Influenza A 09/29/2014  . Acute bacterial sinusitis 09/29/2014  . Paronychia of third finger of right hand 05/06/2014  . Cough variant asthma 05/15/2013  . Left foot pain 12/13/2012  . Traumatic loss of toenail 12/11/2012  . Sinusitis 09/13/2012  . Rash and nonspecific skin eruption 03/25/2012  . Superficial bruising of thigh 03/25/2012  . Dermatitis 02/23/2012  . Screening for  malignant neoplasm of the cervix 12/14/2010  . Physical exam 12/14/2010  . Infertility, female 12/14/2010  . OTHER AND UNSPECIFIED OVARIAN CYST 05/10/2010  . VITAMIN D DEFICIENCY 05/06/2010  . GAS/BLOATING 05/06/2010  . UTI 11/17/2009  . ACUTE BRONCHITIS 08/13/2009  . NAUSEA 08/13/2009  . POLYDIPSIA 05/13/2009  . INJURY OF FACE AND NECK OTHER AND UNSPECIFIED 03/19/2009  . KNEE SPRAIN, LEFT, MEDIAL COLLATERAL LIGAMENT 11/06/2008  . FOOT, PAIN 10/29/2008  . Hypothyroidism 10/02/2008  . ANEMIA-NOS 10/02/2008  . ADD 10/02/2008  . MIGRAINE 10/02/2008  . RHINITIS 10/02/2008  . ABDOMINAL PAIN 10/02/2008  . BIPOLAR AFFECTIVE DISORDER, HX OF 10/02/2008   Laureen Abrahams, PT, DPT 09/21/2015 8:21 AM  [facility] [address]  [place], [place], [address] Phone: [phone]   Fax:  [phone]  Name: Teresa Guzman MRN: [id number] Date of Birth: 12-09-1980

## 2015-09-21 NOTE — Patient Instructions (Signed)
Gaze Stabilization: Standing Feet Together (Compliant Surface)    Feet together on pillow, keeping eyes on target on wall _5-6___ feet away, tilt head down 15-30 and move head side to side for __30-60__ seconds. Repeat while moving head up and down for _30-60___ seconds. Do _2-3___ sessions per day. *Use full field stimulus; busy painting or wallpaper*  Copyright  VHI. All rights reserved.   Gaze Stabilization: Tip Card  1.Target must remain in focus, not blurry, and appear stationary while head is in motion. 2.Perform exercises with small head movements (45 to either side of midline). 3.Increase speed of head motion so long as target is in focus. 4.If you wear eyeglasses, be sure you can see target through lens (therapist will give specific instructions for bifocal / progressive lenses). 5.These exercises may provoke dizziness or nausea. Work through these symptoms. If too dizzy, slow head movement slightly. Rest between each exercise. Stop at 5/10 dizziness. 6.Exercises demand concentration; avoid distractions. 7.For safety, perform standing exercises close to a counter, wall, corner, or next to someone.  Copyright  VHI. All rights reserved.   Gaze Stabilization: Walking Toward Target    Keeping eyes on target, walk with target in hand _10-20___ feet,  moving head up and down. Repeat with head tilted down 15-30, moving head side to side for _10-20___ feet. Do __2-3__ sessions per day. Repeat using target on pattern background.  Copyright  VHI. All rights reserved.

## 2015-09-22 ENCOUNTER — Ambulatory Visit: Payer: Self-pay

## 2015-09-22 ENCOUNTER — Encounter: Payer: BLUE CROSS/BLUE SHIELD | Admitting: Physical Therapy

## 2015-09-22 ENCOUNTER — Encounter: Payer: Self-pay | Admitting: Family Medicine

## 2015-09-24 ENCOUNTER — Other Ambulatory Visit: Payer: Self-pay

## 2015-09-24 NOTE — Patient Outreach (Signed)
[facility]) Care Management  09/24/2015  Teresa Guzman 15-Apr-1981 [id number]   SUBJECTIVE: Telephone call to patient regarding EMMI stroke follow up. HIPAA verified with patient.  Patient states she is doing ok.  Patient states she continues to go to physical therapy for her right eye and right shoulder. Patient states her right eye is a little slower than her left and that's why she is still having dizziness. Patient states she does not have any problem with her vision.   Patient states she is still tired and is having the headaches. Patient states her doctor is aware. Patient states she has an appointment scheduled to see the neurologist on 09/29/15.  Patient states her blood pressure was low at her last doctor appointment.  States blood pressure was 90/58. Patient states her blood pressure usually runs normal. Patient states she does not check her blood pressure at home. Patient states she will follow up with her neurologist about her blood pressure and headaches. Patient denies having any more nausea. Patient states she has been referred to [facility] by her doctor. Patient states she saw the Physician assistant. Patient states she is awaiting lab results.  Patient states she continues to take her medications as prescribed. Patient denies having any abnormal bleeding from her gums, in her urine, or having excessive bruising. RNCM advised patient to notify her doctor for these symptoms. Patient verbalized understanding.  RNCM reviewed signs/symptoms of stroke with patient. RNCM discussed with patient mailing her EMMI education material regarding fall prevention and stroke. Patient verbalized agreement to receive education material.  Patient verbally agreed to next outreach call from [facility].  ASSESSMENT:  EMMI stroke transition program.   PLAN; RNCM will send patient EMMI education material on:   Fall prevention, Recovery after stroke and signs of stroke. RNCM will follow  up with patient within  2 weeks.   George Ina RN,BSN,CCM [facility] Telephonic  [phone]

## 2015-09-29 ENCOUNTER — Ambulatory Visit (INDEPENDENT_AMBULATORY_CARE_PROVIDER_SITE_OTHER): Payer: BLUE CROSS/BLUE SHIELD | Admitting: Neurology

## 2015-09-29 ENCOUNTER — Ambulatory Visit: Payer: BLUE CROSS/BLUE SHIELD | Admitting: Physical Therapy

## 2015-09-29 ENCOUNTER — Encounter: Payer: BLUE CROSS/BLUE SHIELD | Admitting: Gastroenterology

## 2015-09-29 ENCOUNTER — Encounter: Payer: Self-pay | Admitting: Neurology

## 2015-09-29 VITALS — BP 108/60 | HR 105 | Ht 60.0 in | Wt 129.9 lb

## 2015-09-29 DIAGNOSIS — I63341 Cerebral infarction due to thrombosis of right cerebellar artery: Secondary | ICD-10-CM | POA: Diagnosis not present

## 2015-09-29 DIAGNOSIS — I7774 Dissection of vertebral artery: Secondary | ICD-10-CM

## 2015-09-29 DIAGNOSIS — G729 Myopathy, unspecified: Secondary | ICD-10-CM | POA: Diagnosis not present

## 2015-09-29 DIAGNOSIS — M436 Torticollis: Secondary | ICD-10-CM

## 2015-09-29 DIAGNOSIS — M6289 Other specified disorders of muscle: Secondary | ICD-10-CM

## 2015-09-29 MED ORDER — NORTRIPTYLINE HCL 10 MG PO CAPS: 10.0000 mg | ORAL_CAPSULE | Freq: Every day | ORAL | Status: DC

## 2015-09-29 NOTE — Progress Notes (Signed)
NEUROLOGY CONSULTATION NOTE  Teresa Guzman MRN: [id number] DOB: 1980-11-10  Referring provider: Dr. Beverely Low Primary care provider: Dr. Beverely Low  Reason for consult:  Stroke  HISTORY OF PRESENT ILLNESS: Teresa Guzman is a 35 year old right-handed female with hypothyroidism, asthma and anemia who presents for acute stroke.  History obtained by patient and hospital notes.  Labs, echo reports and imaging of MRI/MRA and CTAs reviewed.  On the morning of 09/09/15, she woke up with a "crick" in her neck.  She drove to the gym and while in the parking lot, she felt unsteady and was leaning towards the right.  She participated in her cross-fit class.  Afterwards, she felt worse.  She was falling to the right and became plegic on the right side.  She was admitted to [facility] on 09/09/15.  MRI of brain revealed acute right cerebellar infarct.  MRA neck revealed right vertebral artery stenosis.  Follow up CTA of neck confirmed right vertebral artery focal dissection.  There was segment irregularity in the right vertebral artery which raised suspicion of fibromuscular dysplasia.  Echo was normal.  She had nonspecific elevation of ANA.  Hgb A1c 5.7%.  LDL was 76.  She was started on Lipitor 10mg  daily.  She was started on ASA 325mg  and Plavix 75mg  daily.  Strength and balance has returned.  She feels a little dizzy at times, when she stands up or gets out of bed.  She continues to have a constant right sided neck and headache, for which she takes Tylenol. 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   Lights (such as from the computer screen) are triggers and has been out of work.  She does have history of right sided migraines.  PAST MEDICAL HISTORY: Past Medical History  Diagnosis Date  . Endometriosis   . Hypothyroidism   . Stroke (HCC) 09/09/15    had CVA per pt.  . Asthma     PAST SURGICAL HISTORY: Past Surgical History  Procedure Laterality Date  . Arm surgery Left 1987  . Cyst  removal hand      MEDICATIONS: Current Outpatient Prescriptions on File Prior to Visit  Medication Sig Dispense Refill  . aspirin EC 325 MG EC tablet Take 1 tablet (325 mg total) by mouth daily. 30 tablet 2  . Azelastine HCl 0.15 % SOLN use 2 sprays in each  nostril two times daily 90 mL 1  . clopidogrel (PLAVIX) 75 MG tablet Take 1 tablet (75 mg total) by mouth daily. 30 tablet 3  . fluticasone (FLONASE) 50 MCG/ACT nasal spray USE 1 TO 2 SPRAYS INTO THE  NOSE DAILY 32 g 2  . levothyroxine (SYNTHROID, LEVOTHROID) 100 MCG tablet Take 1 tablet (100 mcg total) by mouth daily. 30 tablet 6  . loratadine-pseudoephedrine (CLARITIN-D 12-HOUR) 5-120 MG tablet Take 1 tablet by mouth 2 (two) times daily.    . montelukast (SINGULAIR) 10 MG tablet Take 1 tablet by mouth at  bedtime 90 tablet 1  . Vitamin D, Ergocalciferol, (DRISDOL) 50000 UNITS CAPS capsule Take 1 capsule (50,000 Units total) by mouth every 7 (seven) days. 12 capsule 0  . cyclobenzaprine (FLEXERIL) 10 MG tablet Take 1 tablet (10 mg total) by mouth 3 (three) times daily as needed for muscle spasms. (Patient not taking: Reported on 09/29/2015) 60 tablet 0  . Multiple Vitamin (MULTIVITAMIN WITH MINERALS) TABS tablet Take 1 tablet by mouth daily. Reported on 09/29/2015    .  saccharomyces boulardii (FLORASTOR) 250 MG capsule Take 250 mg by mouth 2 (two) times daily. Reported on 09/29/2015    . TURMERIC PO Take 1 Dose by mouth daily. Reported on 09/29/2015     No current facility-administered medications on file prior to visit.    ALLERGIES: No Known Allergies  FAMILY HISTORY: Family History  Problem Relation Age of Onset  . Colon cancer Brother 10  . Hyperlipidemia Mother   . Hypertension Father   . Hyperlipidemia Father   . Heart attack Father   . Diabetes Father     SOCIAL HISTORY: Social History   Social History  . Marital Status: Married    Spouse Name: N/A  . Number of Children: N/A  . Years of Education: N/A    Occupational History  . Not on file.   Social History Main Topics  . Smoking status: Never Smoker   . Smokeless tobacco: Never Used  . Alcohol Use: No     Comment: occ.  . Drug Use: No  . Sexual Activity: Not on file   Other Topics Concern  . Not on file   Social History Narrative    REVIEW OF SYSTEMS: Constitutional: No fevers, chills, or sweats, no generalized fatigue, change in appetite Eyes: No visual changes, double vision, eye pain Ear, nose and throat: No hearing loss, ear pain, nasal congestion, sore throat Cardiovascular: No chest pain, palpitations Respiratory:  No shortness of breath at rest or with exertion, wheezes GastrointestinaI: No nausea, vomiting, diarrhea, abdominal pain, fecal incontinence Genitourinary:  No dysuria, urinary retention or frequency Musculoskeletal:  No neck pain, back pain Integumentary: No rash, pruritus, skin lesions Neurological: as above Psychiatric: No depression, insomnia, anxiety Endocrine: No palpitations, fatigue, diaphoresis, mood swings, change in appetite, change in weight, increased thirst Hematologic/Lymphatic:  No anemia, purpura, petechiae. Allergic/Immunologic: no itchy/runny eyes, nasal congestion, recent allergic reactions, rashes  PHYSICAL EXAM: Filed Vitals:   09/29/15 1232  BP: 108/60  Pulse: 105   General: No acute distress.  Patient appears well-groomed.  Head:  Normocephalic/atraumatic Eyes:  fundi unremarkable, without vessel changes, exudates, hemorrhages or papilledema. Neck: supple, no paraspinal tenderness, full range of motion Back: No paraspinal tenderness Heart: regular rate and rhythm Lungs: Clear to auscultation bilaterally. Vascular: No carotid bruits. Neurological Exam: Mental status: alert and oriented to person, place, and time, recent and remote memory intact, fund of knowledge intact, attention and concentration intact, speech fluent and not dysarthric, language intact. Cranial  nerves: CN I: not tested CN II: pupils equal, round and reactive to light, visual fields intact, fundi unremarkable, without vessel changes, exudates, hemorrhages or papilledema. CN III, IV, VI:  full range of motion, no nystagmus, no ptosis CN V: facial sensation intact CN VII: upper and lower face symmetric CN VIII: hearing intact CN IX, X: gag intact, uvula midline CN XI: sternocleidomastoid and trapezius muscles intact CN XII: tongue midline Bulk & Tone: normal, no fasciculations. Motor:  5/5 throughout  Sensation: temperature and vibration sensation intact. Deep Tendon Reflexes:  2+ throughout, toes downgoing.  Finger to nose testing:  Without dysmetria.  Heel to shin:  Without dysmetria.  Gait:  Normal station and stride.  Able to turn and tandem walk. Romberg negative.  IMPRESSION: Right cerebellar infarct, thrombotic secondary to right vertebral dissection.  Etiology unclear.  May have been due to excessive exercise.  Symptoms started prior to exercise but she does vigorous exercise daily.  There is question of possible fibromuscular dysplasia  Right sided headache, secondary to  above.  Also has migraine  PLAN: 1.  Continue dual antiplatelet therapy until follow up in 2 months. 2.  Discontinue Lipitor.  It appears that her stroke was related to trauma from vertebral dissection, so I question its utility.   3.  For headaches, will start nortriptyline  at bedtime 4.  Tylenol or Excedrin Tension as needed (limit to 2 days out of the week) 5.  Will keep patient out of work until follow up.  If headaches are better, she may return. 6.  No exercise 7.  Repeat MRA of neck in 2 months just prior to follow up.  Thank you for allowing me to take part in the care of this patient.  Shon Millet, DO  CC:  [name], MD

## 2015-09-29 NOTE — Therapy (Signed)
[facility] [address]  [address] [place], [place], [address] Phone: [phone]   Fax:  [phone]  Physical Therapy Treatment  Patient Details  Name: Teresa Guzman MRN: [id number] Date of Birth: 03/09/1981 Referring Provider: Susa Raring, MD  Encounter Date: 09/29/2015      PT End of Session - 09/29/15 6962    Visit Number 4   Number of Visits 9   Date for PT Re-Evaluation 10/16/15   PT Start Time 0810  pt late   PT Stop Time 0854   PT Time Calculation (min) 44 min      Past Medical History  Diagnosis Date  . Endometriosis   . Hypothyroidism   . Stroke ([facility]) 09/09/15    had CVA per pt.  . Asthma     Past Surgical History  Procedure Laterality Date  . Arm surgery Left 1987  . Cyst removal hand      There were no vitals filed for this visit.  Visit Diagnosis:  Muscle tightness  Neck stiffness      Subjective Assessment - 09/29/15 0926    Subjective states felt mild improvement in tone to R neck/scapular muscles after dry needling treatment but still notes restriction/tightness with L rotation   Currently in Pain? Yes   Pain Score 4   with L rotation   Pain Location Neck   Pain Orientation Right            TODAY'S TREATMENT: Manual - pt with tenderness and elevated resting tone noted throughout R upper to lower trapezius. STM and TPR in L side-lying throughout R trapezius and grade 3 scapular retraction and distraction mobs.  Following this, performed Dry needling (verbal informed consent given prior to treatment) throughout R lower trapezius with pt prone and scapula lifted with pillow positioning to anterior shoulder and needling to R upper trap with pt prone.  No twitch response noted today.  Following Dry needling, 2 strips of Kinesiotape applied from lateral delt into upper trap at 50% stretch and from anterior delt wrapping posterior to inferior angle of R scapula at 50% stretch  HEP  instruct in chest stretch and prone scapular retraction - did not perform today due to time constraints (pt arrived late)          PT Education - 09/29/15 0924    Education provided Yes   Education Details chest stretch, prone scapular retraction with shoulder extension to neutral   Person(s) Educated Patient   Methods Explanation;Demonstration   Comprehension Verbalized understanding             PT Long Term Goals - 09/29/15 0925    PT LONG TERM GOAL #1   Title Pt will be independent with HEP in order to indicate improved functional mobility and decreased dizziness. (Target Date: 10/14/15)   Status On-going   PT LONG TERM GOAL #2   Title Pt will report no more than 2/10 dizziness with functional mobility involving head turns to indicate vestibular adaptation.     Status On-going   PT LONG TERM GOAL #3   Title Pt will report return to community activities without limitation of dizziness.     Status On-going   PT LONG TERM GOAL #4   Title Pt will ambulate over varying outdoor surfaces at mod I level in order to demonstrate safe return to community and leisure activities.     Status On-going   PT LONG TERM GOAL #5  Title pt displays normal c-spine AROM without limitation by neck pain by 10/14/15   Status On-going               Plan - 09/29/15 4098    Clinical Impression Statement pt reports mild benefit with last dry needling treatment.  Today worked into R scapula and found tightness and tenderness throughout entire trapezius.  Performed STM and scapular mobs as well as needling to the area.  Instructed in HEP to promote more of a neutral scapular posture - has mild rounded shoulder posture.   PT Next Visit Plan dry needling and modalities/exercises to normalize R upper trap tone, habituation and gaze stabilization as needed   Consulted and Agree with Plan of Care Patient        Problem List Patient Active Problem List   Diagnosis Date Noted  . Trapezius muscle  spasm 09/15/2015  . Positive ANA (antinuclear antibody) 09/15/2015  . 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 CVA (cerebral infarction) 09/09/2015  . Asthma 09/09/2015  . AKI (acute kidney injury) (HCC) 09/09/2015  . 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 Vertebral artery dissection (HCC)   . Pap smear for cervical cancer screening 07/21/2015  . Right foot infection 06/21/2015  . Acute bacterial bronchitis 02/26/2015  . Influenza A 09/29/2014  . Acute bacterial sinusitis 09/29/2014  . Paronychia of third finger of right hand 05/06/2014  . Cough variant asthma 05/15/2013  . Left foot pain 12/13/2012  . Traumatic loss of toenail 12/11/2012  . Sinusitis 09/13/2012  . Rash and nonspecific skin eruption 03/25/2012  . Superficial bruising of thigh 03/25/2012  . Dermatitis 02/23/2012  . Screening for malignant neoplasm of the cervix 12/14/2010  . Physical exam 12/14/2010  . Infertility, female 12/14/2010  . OTHER AND UNSPECIFIED OVARIAN CYST 05/10/2010  . VITAMIN D DEFICIENCY 05/06/2010  . GAS/BLOATING 05/06/2010  . UTI 11/17/2009  . ACUTE BRONCHITIS 08/13/2009  . NAUSEA 08/13/2009  . POLYDIPSIA 05/13/2009  . INJURY OF FACE AND NECK OTHER AND UNSPECIFIED 03/19/2009  . KNEE SPRAIN, LEFT, MEDIAL COLLATERAL LIGAMENT 11/06/2008  . FOOT, PAIN 10/29/2008  . Hypothyroidism 10/02/2008  . ANEMIA-NOS 10/02/2008  . ADD 10/02/2008  . MIGRAINE 10/02/2008  . RHINITIS 10/02/2008  . ABDOMINAL PAIN 10/02/2008  . BIPOLAR AFFECTIVE DISORDER, HX OF 10/02/2008    Lori-Ann Lindfors PT, OCS 09/29/2015, 9:30 AM  [facility] [address]  [address] [place], [place], [address] Phone: [phone]   Fax:  [phone]  Name: [name] MRN: [id number] Date of Birth: 11-Sep-1980

## 2015-09-29 NOTE — Patient Instructions (Signed)
1.  Continue aspirin and Plavix for now. 2.  Stop Lipitor (atorvastatin) 3.  Start nortriptyline  at bedtime for headache.  Call in 4 weeks with update 4.  Limit use of Tylenol or Excedrin Tension to no more than 2 days out of the week if possible 5.  No exercise.   6.  Will have you stay out of work until follow up (unless you feel up to it) 7.  Repeat MRA of neck in 2 months just prior to follow up

## 2015-10-01 ENCOUNTER — Other Ambulatory Visit: Payer: Self-pay

## 2015-10-01 NOTE — Patient Outreach (Signed)
[facility]) Care Management  10/01/2015  Teresa Guzman 03/23/1981 [id number]  SUBJECTIVE: Telephone call to patient regarding EMMI stroke follow up. HIPAA verified with patient. Patient states she is doing well. Patient states she has a follow up visit with her neurologist on 09/29/15. Patient states Dr. Everlena Cooper discontinued her cholesterol medication and put her on Nortriptyline  to help with her dizziness and headaches. Patient states she was instructed not to exercise for 2 months and she is not able to return to work at this time.  Patient states she is not as dizzy as she was.  Patient states she has a follow up visit with Dr. Everlena Cooper in April 2017. Patient states she is continuing with therapy for hier shoulder.  Patient denies having any other deficits from stroke. Patient states she can tell that her energy is returning.  Patient verbalized 3 signs of stroke:  Sudden onset of headache, paralysis to one side of the body, and slurred speech.  Patient states her neurologist didn't think her blood pressure was an issue. Patient states her blood pressure was normal at the doctors office. Patient states she is unsure of exact blood pressure reading. Patient denies any other concerns at this time.  Denies needed any additional educational material currently.   ASSESSMENT;  EMMI stroke transition program.  Patient continues to progress and self manage care.   PLAN; RNCM will follow up with patient within 1 week.   George Ina RN,BSN,CCM [facility] Telephonic  [phone]

## 2015-10-04 ENCOUNTER — Telehealth: Payer: Self-pay | Admitting: Neurology

## 2015-10-04 ENCOUNTER — Encounter: Payer: Self-pay | Admitting: Neurology

## 2015-10-04 NOTE — Telephone Encounter (Signed)
Teresa Guzman called stating she needs a letter for work for her disability. Letter needs to state she can't return to work and why. Please call when the letter is ready. [phone]

## 2015-10-04 NOTE — Telephone Encounter (Signed)
Letter drafted

## 2015-10-04 NOTE — Telephone Encounter (Signed)
Letter faxed to [facility] at pt's request. Fax number [phone]

## 2015-10-04 NOTE — Telephone Encounter (Signed)
Please see message below.  Please advise

## 2015-10-05 ENCOUNTER — Ambulatory Visit: Payer: BLUE CROSS/BLUE SHIELD | Admitting: Physical Therapy

## 2015-10-05 DIAGNOSIS — I63541 Cerebral infarction due to unspecified occlusion or stenosis of right cerebellar artery: Secondary | ICD-10-CM

## 2015-10-05 DIAGNOSIS — R42 Dizziness and giddiness: Secondary | ICD-10-CM

## 2015-10-05 DIAGNOSIS — G729 Myopathy, unspecified: Secondary | ICD-10-CM | POA: Diagnosis not present

## 2015-10-05 DIAGNOSIS — M6289 Other specified disorders of muscle: Secondary | ICD-10-CM

## 2015-10-05 DIAGNOSIS — H832X1 Labyrinthine dysfunction, right ear: Secondary | ICD-10-CM

## 2015-10-05 NOTE — Patient Instructions (Signed)
Feet Partial Heel-Toe (Compliant Surface) Head Motion - Eyes Closed    Stand on compliant surface: _pillow__ with right foot partially in front of the other. Close eyes and move head quickly, up and down 10 times.  Also perform turning head side to side and tilting head side to side 10 times each. Repeat __1__ times per session. Do _2-3___ sessions per day.  Copyright  VHI. All rights reserved.

## 2015-10-05 NOTE — Therapy (Signed)
[facility] [address]  [place], [place], [address] Phone: [phone]   Fax:  [phone]  Physical Therapy Treatment  Patient Details  Name: Teresa Guzman MRN: [id number] Date of Birth: 07-23-1981 Referring Provider: Lala Lund, MD  Encounter Date: 10/05/2015      PT End of Session - 10/05/15 1426    Visit Number 5   Number of Visits 9   Date for PT Re-Evaluation 10/16/15   Authorization Type [employer]BS 30 visit limit PT/OT, 3500 ded, 100 met   PT Start Time 1345   PT Stop Time 1414   PT Time Calculation (min) 29 min   Activity Tolerance Patient tolerated treatment well   Behavior During Therapy [facility] for tasks assessed/performed      Past Medical History  Diagnosis Date  . Endometriosis   . Hypothyroidism   . Stroke ([place]) 09/09/15    had CVA per pt.  . Asthma     Past Surgical History  Procedure Laterality Date  . Arm surgery Left 1987  . Cyst removal hand      There were no vitals filed for this visit.  Visit Diagnosis:  Muscle tightness  Dizziness and giddiness  Vestibular dysfunction, right  Cerebrovascular accident (CVA) due to occlusion of right cerebellar artery (HCC)      Subjective Assessment - 10/05/15 1344    Subjective doing well; dizziness is getting better.  playing with children but overall only with turning fast (feels like body is trying to catch up).  MD stated no exercise x 3 months (no cardio)   Currently in Pain? No/denies            [facility] PT Assessment - 10/05/15 1422    Observation/Other Assessments   Focus on Therapeutic Outcomes (FOTO)  79 (21% limited)   Stroke Impact Scale  Mobility 100%            Vestibular Assessment - 10/05/15 1347    Symptom Behavior   Type of Dizziness Unsteady with head/body turns   Aggravating Factors Turning body quickly   Relieving Factors Closing eyes;Rest;Slow movements                 [facility] Adult PT  Treatment/Exercise - 10/05/15 1422    Ambulation/Gait   Ambulation/Gait Yes   Ambulation/Gait Assistance 7: Independent   Ambulation Distance (Feet) 500 Feet   Assistive device None   Gait Pattern Within Functional Limits   Ambulation Surface Unlevel;Outdoor;Grass;Paved;Indoor;Level   Self-Care   Other Self-Care Comments  ways to decrease headache while at computer and possible reasons for headaches as computer (posture; CVA, etc)             Balance Exercises - 10/05/15 1424    Balance Exercises: Standing   Partial Tandem Stance Eyes closed;Foam/compliant surface   Other Standing Exercises gait with head turns to targets; standing quick turns to targets variable directions; no increase in symptoms   Balance Exercises: Standing   Tandem Stance Limitations horizontal/vertical/horizontal tilts x 10 each direction                PT Long Term Goals - 10/05/15 1428    PT LONG TERM GOAL #1   Title Pt will be independent with HEP in order to indicate improved functional mobility and decreased dizziness. (Target Date: 10/14/15)   Status Achieved   PT LONG TERM GOAL #2   Title Pt will report no more than 2/10 dizziness with functional  mobility involving head turns to indicate vestibular adaptation.     Status Achieved   PT LONG TERM GOAL #3   Title Pt will report return to community activities without limitation of dizziness.     Status Achieved   PT LONG TERM GOAL #4   Title Pt will ambulate over varying outdoor surfaces at mod I level in order to demonstrate safe return to community and leisure activities.     Status Achieved               Plan - 10/05/15 1427    Clinical Impression Statement Pt reports no dizziness except occasional imbalance in busy environments with quick turns. Educated on habituation exercises for symptoms and pt reports symptoms very mild when they do occur.  At this time no further vestibular interventions needed.  FOTO score decreased due to  activity restrictions from MD.   PT Next Visit Plan dry needling and modalities/exercises to normalize R upper trap tone        Problem List Patient Active Problem List   Diagnosis Date Noted  . Trapezius muscle spasm 09/15/2015  . Positive ANA (antinuclear antibody) 09/15/2015  . 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 CVA (cerebral infarction) 09/09/2015  . Asthma 09/09/2015  . AKI (acute kidney injury) ([place]) 09/09/2015  . Thrombotic stroke involving right cerebellar artery ([place]) 09/09/2015  . Vertebral artery dissection ([place])   . Pap smear for cervical cancer screening 07/21/2015  . Right foot infection 06/21/2015  . Acute bacterial bronchitis 02/26/2015  . Influenza A 09/29/2014  . Acute bacterial sinusitis 09/29/2014  . Paronychia of third finger of right hand 05/06/2014  . Cough variant asthma 05/15/2013  . Left foot pain 12/13/2012  . Traumatic loss of toenail 12/11/2012  . Sinusitis 09/13/2012  . Rash and nonspecific skin eruption 03/25/2012  . Superficial bruising of thigh 03/25/2012  . Dermatitis 02/23/2012  . Screening for malignant neoplasm of the cervix 12/14/2010  . Physical exam 12/14/2010  . Infertility, female 12/14/2010  . OTHER AND UNSPECIFIED OVARIAN CYST 05/10/2010  . VITAMIN D DEFICIENCY 05/06/2010  . GAS/BLOATING 05/06/2010  . UTI 11/17/2009  . ACUTE BRONCHITIS 08/13/2009  . NAUSEA 08/13/2009  . POLYDIPSIA 05/13/2009  . INJURY OF FACE AND NECK OTHER AND UNSPECIFIED 03/19/2009  . KNEE SPRAIN, LEFT, MEDIAL COLLATERAL LIGAMENT 11/06/2008  . FOOT, PAIN 10/29/2008  . Hypothyroidism 10/02/2008  . ANEMIA-NOS 10/02/2008  . ADD 10/02/2008  . MIGRAINE 10/02/2008  . RHINITIS 10/02/2008  . ABDOMINAL PAIN 10/02/2008  . BIPOLAR AFFECTIVE DISORDER, HX OF 10/02/2008   Laureen Abrahams, PT, DPT 10/05/2015 2:33 PM  [facility] [address]  [place],  [place], [address] Phone: [phone]   Fax:  [phone]  Name: HOLLYN STUCKY MRN: [id number] Date of Birth: Apr 11, 1981

## 2015-10-07 ENCOUNTER — Other Ambulatory Visit: Payer: Self-pay

## 2015-10-07 ENCOUNTER — Encounter: Payer: Self-pay | Admitting: Family Medicine

## 2015-10-07 NOTE — Patient Outreach (Signed)
[facility]) Care Management  10/07/2015  Jacquese Cassarino Bethard 05/10/1981 [id number]   SUBJECTIVE: Telephone call to patient regarding EMMI stroke follow up. HIPAA verified with patient. Patient states she is doing well.  Patient reports her visual therapy ended on Wednesday 10/06/15.  Patient states she continues with her physical therapy. Patient states her headaches and dizziness have improved. States she feels like they have gotten much better since the doctor put her on the new medication. Patient states the therapist recommended she see an eye doctor for check up.  Patient reports her blood pressures have been stable. Denies checking her blood pressure today.  Patient was able to state 3 symptoms of stroke;  Sudden onset of severe headache, slurred speech, weakness on one side of the body. Patient denies having any unusual symptoms. Patient denies having any additional concerns at this time Patient agreed to next telephone outreach with [facility]. Marland Kitchen   ASSESSMENT:  EMMI stroke transition program follow up.  Patient continues to self manage care. Symptoms of dizziness and headaches have improved on P[place]or.  PLAN: RNCM will make follow up call within 1week for closure.  George Ina RN,BSN,CCM [facility] Telephonic  [phone]

## 2015-10-08 ENCOUNTER — Ambulatory Visit: Payer: BLUE CROSS/BLUE SHIELD | Attending: Internal Medicine | Admitting: Physical Therapy

## 2015-10-08 DIAGNOSIS — M436 Torticollis: Secondary | ICD-10-CM | POA: Diagnosis present

## 2015-10-08 DIAGNOSIS — M6289 Other specified disorders of muscle: Secondary | ICD-10-CM

## 2015-10-08 DIAGNOSIS — G729 Myopathy, unspecified: Secondary | ICD-10-CM | POA: Diagnosis present

## 2015-10-08 NOTE — Therapy (Signed)
[facility] [address]  [address] [place], [place], [address] Phone: [phone]   Fax:  [phone]  Physical Therapy Treatment  Patient Details  Name: Teresa Guzman MRN: [id number] Date of Birth: 1980-12-17 Referring Provider: Susa Raring, MD  Encounter Date: 10/08/2015      PT End of Session - 10/08/15 0718    Visit Number 6   Number of Visits 9   Date for PT Re-Evaluation 10/16/15   PT Start Time 0716   PT Stop Time 0830   PT Time Calculation (min) 74 min      Past Medical History  Diagnosis Date  . Endometriosis   . Hypothyroidism   . Stroke (HCC) 09/09/15    had CVA per pt.  . Asthma     Past Surgical History  Procedure Laterality Date  . Arm surgery Left 1987  . Cyst removal hand      There were no vitals filed for this visit.  Visit Diagnosis:  Muscle tightness  Neck stiffness      Subjective Assessment - 10/08/15 0718    Subjective States is doing will with dizziness and PT that was working with her has discontinued treatments. States neck felt really tight yesterday and last night but states was on computer for 6 hours throughout the day which is likely cause of tightness. Had difficulty sleeping last night but more due to stress rather than pain.  At last MD appointment, she was advised against any exercise for next 3 months and was told may not be able to return to work until 20April2017.          TODAY'S TREATMENT: Manual - pt with tenderness and elevated resting tone noted throughout R upper trapezius today (the rest of the trap seems much better vs last treatment). Dry needling (verbal informed consent given prior to treatment) throughout R upper trapezius with pt prone.Good twitch response noted today. Following DN, with pt prone performed grade 3 CPA to upper t-spine and grade 2 AP mobs R 1st rib; then with pt supine performed grade 2 and 3 MRF to R UT and suboccip release.  Very high  tone throughout B suboccip area and TTP on R and pt states this is where her HA seem to come from.  Mechanical Traction - c-spine, 15dg pull, 10#/5#, 60"/20", 12' (noted some L neck discomfort with this but did not inform me of this until completed)             PT Long Term Goals - 10/05/15 1428    PT LONG TERM GOAL #1   Title Pt will be independent with HEP in order to indicate improved functional mobility and decreased dizziness. (Target Date: 10/14/15)   Status Achieved   PT LONG TERM GOAL #2   Title Pt will report no more than 2/10 dizziness with functional mobility involving head turns to indicate vestibular adaptation.     Status Achieved   PT LONG TERM GOAL #3   Title Pt will report return to community activities without limitation of dizziness.     Status Achieved   PT LONG TERM GOAL #4   Title Pt will ambulate over varying outdoor surfaces at mod I level in order to demonstrate safe return to community and leisure activities.     Status Achieved               Plan - 10/08/15 0827    Clinical Impression Statement pt advised  by neuro to not perform exercise for 3 months.  today's treatment still focused on manual and passive treatment to assist with improving tone and tissue pliability to R UT and upper c-spine/suboccip area.  Pt with likely cervicogenic HA which arise from R suboccip area so will address this area regularly going forward.  She is no longer being treated by PT for dizziness/vertigo.   PT Next Visit Plan dry needling and modalities/exercises to normalize R upper trap and suboccip area tone    Consulted and Agree with Plan of Care Patient        Problem List Patient Active Problem List   Diagnosis Date Noted  . Trapezius muscle spasm 09/15/2015  . Positive ANA (antinuclear antibody) 09/15/2015  . 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 CVA (cerebral infarction) 09/09/2015  . Asthma 09/09/2015  . AKI (acute kidney  injury) (HCC) 09/09/2015  . 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 Vertebral artery dissection (HCC)   . Pap smear for cervical cancer screening 07/21/2015  . Right foot infection 06/21/2015  . Acute bacterial bronchitis 02/26/2015  . Influenza A 09/29/2014  . Acute bacterial sinusitis 09/29/2014  . Paronychia of third finger of right hand 05/06/2014  . Cough variant asthma 05/15/2013  . Left foot pain 12/13/2012  . Traumatic loss of toenail 12/11/2012  . Sinusitis 09/13/2012  . Rash and nonspecific skin eruption 03/25/2012  . Superficial bruising of thigh 03/25/2012  . Dermatitis 02/23/2012  . Screening for malignant neoplasm of the cervix 12/14/2010  . Physical exam 12/14/2010  . Infertility, female 12/14/2010  . OTHER AND UNSPECIFIED OVARIAN CYST 05/10/2010  . VITAMIN D DEFICIENCY 05/06/2010  . GAS/BLOATING 05/06/2010  . UTI 11/17/2009  . ACUTE BRONCHITIS 08/13/2009  . NAUSEA 08/13/2009  . POLYDIPSIA 05/13/2009  . INJURY OF FACE AND NECK OTHER AND UNSPECIFIED 03/19/2009  . KNEE SPRAIN, LEFT, MEDIAL COLLATERAL LIGAMENT 11/06/2008  . FOOT, PAIN 10/29/2008  . Hypothyroidism 10/02/2008  . ANEMIA-NOS 10/02/2008  . ADD 10/02/2008  . MIGRAINE 10/02/2008  . RHINITIS 10/02/2008  . ABDOMINAL PAIN 10/02/2008  . BIPOLAR AFFECTIVE DISORDER, HX OF 10/02/2008    Teresa Guzman PT, OCS 10/08/2015, 8:31 AM  [facility] [address]  [address] [place], [place], [address] Phone: [phone]   Fax:  [phone]  Name: Teresa Guzman MRN: [id number] Date of Birth: 11/08/1980

## 2015-10-12 ENCOUNTER — Ambulatory Visit: Payer: BLUE CROSS/BLUE SHIELD | Admitting: Neurology

## 2015-10-12 ENCOUNTER — Ambulatory Visit: Payer: BLUE CROSS/BLUE SHIELD | Admitting: Physical Therapy

## 2015-10-12 DIAGNOSIS — M6289 Other specified disorders of muscle: Secondary | ICD-10-CM

## 2015-10-12 DIAGNOSIS — M436 Torticollis: Secondary | ICD-10-CM

## 2015-10-12 DIAGNOSIS — G729 Myopathy, unspecified: Secondary | ICD-10-CM | POA: Diagnosis not present

## 2015-10-12 NOTE — Therapy (Signed)
[facility] [address]  [address] [place], [place], [address] Phone: [phone]   Fax:  [phone]  Physical Therapy Treatment  Patient Details  Name: Teresa Guzman MRN: [id number] Date of Birth: 09/26/1980 Referring Provider: Susa Raring, MD  Encounter Date: 10/12/2015      PT End of Session - 10/12/15 1535    Visit Number 7   Number of Visits 9   Date for PT Re-Evaluation 10/16/15   PT Start Time 1534   PT Stop Time 1624   PT Time Calculation (min) 50 min      Past Medical History  Diagnosis Date  . Endometriosis   . Hypothyroidism   . Stroke (HCC) 09/09/15    had CVA per pt.  . Asthma     Past Surgical History  Procedure Laterality Date  . Arm surgery Left 1987  . Cyst removal hand      There were no vitals filed for this visit.  Visit Diagnosis:  Muscle tightness  Neck stiffness      Subjective Assessment - 10/12/15 1539    Subjective Had HA all day after last treatment and believes due to traction.  States has noted n/t in L UE with c-spine rotation to L.  She states this has been present for long time (several months) but just thought she'd mention it.   Currently in Pain? No/denies         TODAY'S TREATMENT: Manual - L UE nerve glides (median bias) - also performed on R UE but normal Upper c-spine retraction mobs grade 3, TPR B UT and subocip release Dry needling (verbal informed consent given prior to treatment) throughout B upper trapezius with pt supine.Good twitch response noted in B traps.              PT Long Term Goals - 10/05/15 1428    PT LONG TERM GOAL #1   Title Pt will be independent with HEP in order to indicate improved functional mobility and decreased dizziness. (Target Date: 10/14/15)   Status Achieved   PT LONG TERM GOAL #2   Title Pt will report no more than 2/10 dizziness with functional mobility involving head turns to indicate vestibular adaptation.     Status Achieved   PT LONG TERM GOAL #3   Title Pt will report return to community activities without limitation of dizziness.     Status Achieved   PT LONG TERM GOAL #4   Title Pt will ambulate over varying outdoor surfaces at mod I level in order to demonstrate safe return to community and leisure activities.     Status Achieved               Plan - 10/12/15 1822    Clinical Impression Statement no traction today due to HA after last treatment.  Pt informed me today of L UE n/t with c-spine L rotation. She states she has noted this for around 1 year.  Special testing includes POS ULTT with median bias on L and POS L c-spine quadrant testing (also POS on R but much less intense).  Following assessment, today's treatment focused on B neck and UT manual therapy.  Will add some therapeutic exercise for scap retraction to assist with symptoms next treatment.   PT Next Visit Plan dry needling and modalities/exercises to normalize R upper trap and suboccip area tone    Consulted and Agree with Plan of Care Patient  Problem List Patient Active Problem List   Diagnosis Date Noted  . Trapezius muscle spasm 09/15/2015  . Positive ANA (antinuclear antibody) 09/15/2015  . 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 CVA (cerebral infarction) 09/09/2015  . Asthma 09/09/2015  . AKI (acute kidney injury) (HCC) 09/09/2015  . 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 Vertebral artery dissection (HCC)   . Pap smear for cervical cancer screening 07/21/2015  . Right foot infection 06/21/2015  . Acute bacterial bronchitis 02/26/2015  . Influenza A 09/29/2014  . Acute bacterial sinusitis 09/29/2014  . Paronychia of third finger of right hand 05/06/2014  . Cough variant asthma 05/15/2013  . Left foot pain 12/13/2012  . Traumatic loss of toenail 12/11/2012  . Sinusitis 09/13/2012  . Rash and nonspecific skin eruption 03/25/2012  .  Superficial bruising of thigh 03/25/2012  . Dermatitis 02/23/2012  . Screening for malignant neoplasm of the cervix 12/14/2010  . Physical exam 12/14/2010  . Infertility, female 12/14/2010  . OTHER AND UNSPECIFIED OVARIAN CYST 05/10/2010  . VITAMIN D DEFICIENCY 05/06/2010  . GAS/BLOATING 05/06/2010  . UTI 11/17/2009  . ACUTE BRONCHITIS 08/13/2009  . NAUSEA 08/13/2009  . POLYDIPSIA 05/13/2009  . INJURY OF FACE AND NECK OTHER AND UNSPECIFIED 03/19/2009  . KNEE SPRAIN, LEFT, MEDIAL COLLATERAL LIGAMENT 11/06/2008  . FOOT, PAIN 10/29/2008  . Hypothyroidism 10/02/2008  . ANEMIA-NOS 10/02/2008  . ADD 10/02/2008  . MIGRAINE 10/02/2008  . RHINITIS 10/02/2008  . ABDOMINAL PAIN 10/02/2008  . BIPOLAR AFFECTIVE DISORDER, HX OF 10/02/2008    Laqueta Bonaventura PT, OCS 10/12/2015, 6:26 PM  [facility] [address]  [address] [place], [place], [address] Phone: [phone]   Fax:  [phone]  Name: Rudean HaskellDawn T Pask MRN: [id number] Date of Birth: 06/17/1981

## 2015-10-13 ENCOUNTER — Other Ambulatory Visit: Payer: Self-pay

## 2015-10-13 ENCOUNTER — Telehealth: Payer: Self-pay | Admitting: Neurology

## 2015-10-13 MED ORDER — NORTRIPTYLINE HCL 10 MG PO CAPS: 20.0000 mg | ORAL_CAPSULE | Freq: Every day | ORAL | Status: DC

## 2015-10-13 NOTE — Telephone Encounter (Signed)
VM-PT returned your call/[place]  °

## 2015-10-13 NOTE — Telephone Encounter (Signed)
If she is feeling better, we can write her note to return to work

## 2015-10-13 NOTE — Patient Outreach (Signed)
[facility] Care Management  10/13/2015  Teresa Guzman 06/10/1981 [id number]  SUBJECTIVE:  Telephone call to patient regarding EMMI stroke follow up. HIPAA verified with patient. Patient states she is doing good. Patient reports she still has a slight headache but not as bad anymore. Patient states no changes since last telephone outreach with [facility]. Patient states she is aware of signs and symptoms of stroke.  Patient states she is presently out having lunch. Patient states she has not additional concerns or questions. Patient verbalized agreement with closure to EMMI stroke program.   PLAN: RNCM will refer patient to Sherle Poeicole Robinson to close due to meeting goals for EMMI stroke program.   Teresa InaDavina Pluma Diniz RN,BSN,CCM [facility] Telephonic  [phone]

## 2015-10-13 NOTE — Telephone Encounter (Signed)
Pt would like note to return to work. Per last A&P, "5. Will keep patient out of work until follow up. If headaches are better, she may return." Pt had appointment on 10/12/15 but cancelled rescheduled for 11/25/15. Please advise.

## 2015-10-13 NOTE — Telephone Encounter (Signed)
Letter written faxed to number provided by pt. RX sent to pharmacy. Pt aware.

## 2015-10-13 NOTE — Telephone Encounter (Signed)
We can increase nortriptyline to 20mg  at bedtime.  She may return to work on Monday.

## 2015-10-13 NOTE — Telephone Encounter (Signed)
Left message on machine for pt to return call to the office.  

## 2015-10-13 NOTE — Telephone Encounter (Signed)
VM-PT left message she wants a return to work note/Sania CB# [phone]

## 2015-10-13 NOTE — Telephone Encounter (Signed)
Spoke w/ patient. Patient states she has most of her energy back. Still having headaches, but the now come and go, and are not constant, much more tolerable. Would like to know if her medication can be increased to help ease those even more though, if possible. Her vision has not yet cleared, but physical therapist stated strokes can sometimes change vision, and pt admits she's not had them checked in ~5 years. Does have upcoming visit scheduled. Okay to write back to work for Monday?     Fax: [phone]  10/18/15

## 2015-10-19 ENCOUNTER — Ambulatory Visit: Payer: BLUE CROSS/BLUE SHIELD | Admitting: Physical Therapy

## 2015-10-19 DIAGNOSIS — M436 Torticollis: Secondary | ICD-10-CM

## 2015-10-19 DIAGNOSIS — G729 Myopathy, unspecified: Secondary | ICD-10-CM | POA: Diagnosis not present

## 2015-10-19 DIAGNOSIS — M6289 Other specified disorders of muscle: Secondary | ICD-10-CM

## 2015-10-19 NOTE — Therapy (Signed)
[facility] [address]  [address] [place], [place], [address] Phone: [phone]   Fax:  [phone]  Physical Therapy Treatment  Patient Details  Name: Teresa Guzman MRN: [id number] Date of Birth: [birth year] Referring Provider: Susa Raring, MD  Encounter Date: 10/19/2015      PT End of Session - 10/19/15 0807    Visit Number 8   Number of Visits 9   PT Start Time 0805   PT Stop Time 0848   PT Time Calculation (min) 43 min      Past Medical History  Diagnosis Date  . Endometriosis   . Hypothyroidism   . Stroke (HCC) 09/09/15    had CVA per pt.  . Asthma     Past Surgical History  Procedure Laterality Date  . Arm surgery Left 1987  . Cyst removal hand      There were no vitals filed for this visit.  Visit Diagnosis:  Muscle tightness  Neck stiffness      Subjective Assessment - 10/19/15 0807    Subjective States returned to work yesterday.  Noted some tightness in neck as well as headache yesterday which she rated 3/10.  No pain this AM just B upper scapular / neck tightness.   Currently in Pain? No/denies            [facility] PT Assessment - 10/19/15 0001    ROM / Strength   AROM / PROM / Strength --  B Shoulder 5/5 - notes R upper scapular pain with RROM Flex          TODAY'S TREATMENT Manual - Dry Needling (verbal informed consent provided prior to treatment) performed to R UT with pt prone. Several good twitch responses noted.  Following needling performed STM and TPR to R medial UT and LS.  TherEx - Hooklying on 1/2 FR:   Pec Stretch/T-spine extension 1'   B ER with scap retraction Green TB 10x (began noting pain down R UE)   UE Flexion with Contra Ext Yellow TB 10x each   Pullover 5# 10x   (all TB exercises on FR with emphasis on deep breathing during exercise) L Side-Lying R ABD AROM 10x then with 2# 10x Standing R IR Green TB 15x                        PT Long  Term Goals - 10/05/15 1428    PT LONG TERM GOAL #1   Title Pt will be independent with HEP in order to indicate improved functional mobility and decreased dizziness. (Target Date: 10/14/15)   Status Achieved   PT LONG TERM GOAL #2   Title Pt will report no more than 2/10 dizziness with functional mobility involving head turns to indicate vestibular adaptation.     Status Achieved   PT LONG TERM GOAL #3   Title Pt will report return to community activities without limitation of dizziness.     Status Achieved   PT LONG TERM GOAL #4   Title Pt will ambulate over varying outdoor surfaces at mod I level in order to demonstrate safe return to community and leisure activities.     Status Achieved               Plan - 10/19/15 0847    Clinical Impression Statement addressed R UT and LS tone and pain with DN and manual TPR today.  Seems high tone and pain  may be due to scar tissue in this area (very high density noted with needling).  Added some exercises to POC today and will add to HEP next visit if no increased pain following today's treatment.   PT Next Visit Plan update HEP; dry needling and modalities/exercises to normalize R upper trap and suboccip area tone    Consulted and Agree with Plan of Care Patient        Problem List Patient Active Problem List   Diagnosis Date Noted  . Trapezius muscle spasm 09/15/2015  . Positive ANA (antinuclear antibody) 09/15/2015  . 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 CVA (cerebral infarction) 09/09/2015  . Asthma 09/09/2015  . AKI (acute kidney injury) (HCC) 09/09/2015  . 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 Vertebral artery dissection (HCC)   . Pap smear for cervical cancer screening 07/21/2015  . Right foot infection 06/21/2015  . Acute bacterial bronchitis 02/26/2015  . Influenza A 09/29/2014  . Acute bacterial sinusitis 09/29/2014  . Paronychia of third finger of  right hand 05/06/2014  . Cough variant asthma 05/15/2013  . Left foot pain 12/13/2012  . Traumatic loss of toenail 12/11/2012  . Sinusitis 09/13/2012  . Rash and nonspecific skin eruption 03/25/2012  . Superficial bruising of thigh 03/25/2012  . Dermatitis 02/23/2012  . Screening for malignant neoplasm of the cervix 12/14/2010  . Physical exam 12/14/2010  . Infertility, female 12/14/2010  . OTHER AND UNSPECIFIED OVARIAN CYST 05/10/2010  . VITAMIN D DEFICIENCY 05/06/2010  . GAS/BLOATING 05/06/2010  . UTI 11/17/2009  . ACUTE BRONCHITIS 08/13/2009  . NAUSEA 08/13/2009  . POLYDIPSIA 05/13/2009  . INJURY OF FACE AND NECK OTHER AND UNSPECIFIED 03/19/2009  . KNEE SPRAIN, LEFT, MEDIAL COLLATERAL LIGAMENT 11/06/2008  . FOOT, PAIN 10/29/2008  . Hypothyroidism 10/02/2008  . ANEMIA-NOS 10/02/2008  . ADD 10/02/2008  . MIGRAINE 10/02/2008  . RHINITIS 10/02/2008  . ABDOMINAL PAIN 10/02/2008  . BIPOLAR AFFECTIVE DISORDER, HX OF 10/02/2008    Teresa Guzman PT, OCS 10/19/2015, 8:51 AM  [facility] [address]  [address] [place], [place], [address] Phone: [phone]   Fax:  [phone]  Name: Teresa Guzman MRN: [id number] Date of Birth: 05/25/1981

## 2015-10-22 ENCOUNTER — Ambulatory Visit: Payer: BLUE CROSS/BLUE SHIELD | Admitting: Physical Therapy

## 2015-10-22 DIAGNOSIS — G729 Myopathy, unspecified: Secondary | ICD-10-CM | POA: Diagnosis not present

## 2015-10-22 DIAGNOSIS — M6289 Other specified disorders of muscle: Secondary | ICD-10-CM

## 2015-10-22 DIAGNOSIS — M436 Torticollis: Secondary | ICD-10-CM

## 2015-10-22 NOTE — Therapy (Signed)
[facility] [place] [facility] [address]  [place], [place], [address] Phone: [phone]   Fax:  [phone]  Physical Therapy Treatment  Patient Details  Name: Teresa Guzman MRN: [id number] Date of Birth: January 18, 1981 Referring Provider: Lala Lund, MD  Encounter Date: 10/22/2015      PT End of Session - 10/22/15 0806    Visit Number 9   Number of Visits 17   Date for PT Re-Evaluation 11/19/15   PT Start Time 0805   PT Stop Time 0845   PT Time Calculation (min) 40 min      Past Medical History  Diagnosis Date  . Endometriosis   . Hypothyroidism   . Stroke ([place]) 09/09/15    had CVA per pt.  . Asthma     Past Surgical History  Procedure Laterality Date  . Arm surgery Left 1987  . Cyst removal hand      There were no vitals filed for this visit.  Visit Diagnosis:  Muscle tightness  Neck stiffness      Subjective Assessment - 10/22/15 0806    Subjective States experienced bad migraine Wednesday PM which lasted until Thursday AM.  Limited ability to sleep due to pain.  Not allowed to take meds for migraine right now so limited options.  She states she felt much better by 6AM Thursday - states drank a large amount of water around 2AM and thinks she may have been dehydrated which caused migraine.  States feels fine this AM with regard to Neck pain.  States work is going okay so far.   Currently in Pain? No/denies            [facility] PT Assessment - 10/22/15 0001    ROM / Strength   AROM / PROM / Strength AROM   AROM   AROM Assessment Site Cervical   Cervical Flexion WNL   Cervical Extension 48  48 when slouched, upper c-spine pain. 55 posture, L UE pull   Cervical - Right Side Bend 25  L Neck pulling   Cervical - Left Side Bend 27  R neck pulling   Cervical - Right Rotation 85   Cervical - Left Rotation 80        TODAY'S TREATMENT  TherEx - Hooklying on 1/2 FR:  Pec Stretch/T-spine extension 1'  UE  Flexion with Contra Ext Red TB 10x each  B ER with scap retraction Green TB 15x   B Horiz ABD Red TB 15x  (all TB exercises on FR with emphasis on deep breathing during exercise) Side-Lying GH ABD AROM 15x each Standing R IR Green TB 15x         PT Education - 10/22/15 0846    Education provided Yes   Education Details HEP progression   Person(s) Educated Patient   Methods Explanation;Demonstration;Handout   Comprehension Verbalized understanding;Returned demonstration             PT Long Term Goals - 10/22/15 1005    PT LONG TERM GOAL #1   Title Pt will be independent with HEP in order to indicate improved functional mobility and decreased dizziness. (Target Date: 10/14/15)   Status Achieved   PT LONG TERM GOAL #2   Title Pt will report no more than 2/10 dizziness with functional mobility involving head turns to indicate vestibular adaptation.     Status Achieved   PT LONG TERM GOAL #3   Title Pt will report return to community activities  without limitation of dizziness.     Status Achieved   PT LONG TERM GOAL #4   Title Pt will ambulate over varying outdoor surfaces at mod I level in order to demonstrate safe return to community and leisure activities.     Status Achieved   PT LONG TERM GOAL #5   Title pt displays normal c-spine AROM without limitation by neck pain by 10/14/15   Status On-going               Plan - 10/22/15 1202    Clinical Impression Statement Lilian is improving with regard to frequency of neck pain but still with high tone and limited c-spine AROM (side-bending and extension) related primarily to soft tissue restrictions.  Instructed in HEP today to assist with improved mechanics and normalizing tone. She has met goals regarding vestibular rehab and this is no longer included in her POC. Our focus going forward is neck pain, function, and AROM.  I would like to extend her POC 4wks at 2x/wk to address remaining limitations.   Pt will benefit from  skilled therapeutic intervention in order to improve on the following deficits Impaired flexibility;Postural dysfunction;Pain;Improper body mechanics;Decreased range of motion   Rehab Potential Good   PT Frequency 2x / week   PT Duration 4 weeks   PT Treatment/Interventions Dry needling;Patient/family education;Therapeutic activities;Functional mobility training;Therapeutic exercise;Manual techniques;Moist Heat;Ultrasound   PT Next Visit Plan dry needling and modalities/exercises to normalize R upper trap and suboccip area tone    Consulted and Agree with Plan of Care Patient        Problem List Patient Active Problem List   Diagnosis Date Noted  . Trapezius muscle spasm 09/15/2015  . Positive ANA (antinuclear antibody) 09/15/2015  . 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 CVA (cerebral infarction) 09/09/2015  . Asthma 09/09/2015  . AKI (acute kidney injury) ([place]) 09/09/2015  . Thrombotic stroke involving right cerebellar artery ([place]) 09/09/2015  . Vertebral artery dissection ([place])   . Pap smear for cervical cancer screening 07/21/2015  . Right foot infection 06/21/2015  . Acute bacterial bronchitis 02/26/2015  . Influenza A 09/29/2014  . Acute bacterial sinusitis 09/29/2014  . Paronychia of third finger of right hand 05/06/2014  . Cough variant asthma 05/15/2013  . Left foot pain 12/13/2012  . Traumatic loss of toenail 12/11/2012  . Sinusitis 09/13/2012  . Rash and nonspecific skin eruption 03/25/2012  . Superficial bruising of thigh 03/25/2012  . Dermatitis 02/23/2012  . Screening for malignant neoplasm of the cervix 12/14/2010  . Physical exam 12/14/2010  . Infertility, female 12/14/2010  . OTHER AND UNSPECIFIED OVARIAN CYST 05/10/2010  . VITAMIN D DEFICIENCY 05/06/2010  . GAS/BLOATING 05/06/2010  . UTI 11/17/2009  . ACUTE BRONCHITIS 08/13/2009  . NAUSEA 08/13/2009  . POLYDIPSIA 05/13/2009  . INJURY OF FACE AND NECK OTHER AND  UNSPECIFIED 03/19/2009  . KNEE SPRAIN, LEFT, MEDIAL COLLATERAL LIGAMENT 11/06/2008  . FOOT, PAIN 10/29/2008  . Hypothyroidism 10/02/2008  . ANEMIA-NOS 10/02/2008  . ADD 10/02/2008  . MIGRAINE 10/02/2008  . RHINITIS 10/02/2008  . ABDOMINAL PAIN 10/02/2008  . BIPOLAR AFFECTIVE DISORDER, HX OF 10/02/2008    HALL,RALPH PT, OCS 10/22/2015, 12:21 PM  [facility] [address]  [place], [place], [address] Phone: [phone]   Fax:  [phone]  Name: NADINA FOMBY MRN: [id number] Date of Birth: 02/11/1981

## 2015-10-26 ENCOUNTER — Ambulatory Visit: Payer: BLUE CROSS/BLUE SHIELD | Admitting: Physical Therapy

## 2015-10-26 DIAGNOSIS — M6289 Other specified disorders of muscle: Secondary | ICD-10-CM

## 2015-10-26 DIAGNOSIS — G729 Myopathy, unspecified: Secondary | ICD-10-CM | POA: Diagnosis not present

## 2015-10-26 DIAGNOSIS — M436 Torticollis: Secondary | ICD-10-CM

## 2015-10-26 NOTE — Therapy (Signed)
[facility] [address]  [address] [place], [place], [address] Phone: [phone]   Fax:  [phone]  Physical Therapy Treatment  Patient Details  Name: Teresa Guzman MRN: [id number] Date of Birth: 03/16/1981 Referring Provider: Susa RaringPrashant Singh, MD  Encounter Date: 10/26/2015      PT End of Session - 10/26/15 0854    Visit Number 10   Number of Visits 17   Date for PT Re-Evaluation 11/19/15   PT Start Time 0853   PT Stop Time 0942   PT Time Calculation (min) 49 min      Past Medical History  Diagnosis Date  . Endometriosis   . Hypothyroidism   . Stroke (HCC) 09/09/15    had CVA per pt.  . Asthma     Past Surgical History  Procedure Laterality Date  . Arm surgery Left 1987  . Cyst removal hand      There were no vitals filed for this visit.  Visit Diagnosis:  Muscle tightness  Neck stiffness      Subjective Assessment - 10/26/15 0854    Subjective States has been performing HEP from last treatment without issue. States feels as though is getting better.  Not getting headaches every day "which is a big thing".   Currently in Pain? Yes  only with motion   Pain Score 2    Pain Location Neck   Pain Orientation Right         TODAY'S TREATMENT TherEx - UBE LVL 1.0 60"/60" Low Row 15# 15x3", 20# 15x3" Hooklying on 1/2 FR:  Pec Stretch/T-spine extension 1'  B ER with scap retraction Green TB 15x Side-Lying GH ABD 3# 15x each Side-Lying GH ER 3# 15x   Manual - L UE Nerve Glides, dry needling (verbal informed consent given prior to treatment) performed with pt prone to B UT.  Good twitch response on L.  Then STM and TPR to L medial UT and LS with several more twitch responses noted during this as well.                 PT Long Term Goals - 10/22/15 1005    PT LONG TERM GOAL #1   Title Pt will be independent with HEP in order to indicate improved functional mobility and decreased dizziness.  (Target Date: 10/14/15)   Status Achieved   PT LONG TERM GOAL #2   Title Pt will report no more than 2/10 dizziness with functional mobility involving head turns to indicate vestibular adaptation.     Status Achieved   PT LONG TERM GOAL #3   Title Pt will report return to community activities without limitation of dizziness.     Status Achieved   PT LONG TERM GOAL #4   Title Pt will ambulate over varying outdoor surfaces at mod I level in order to demonstrate safe return to community and leisure activities.     Status Achieved   PT LONG TERM GOAL #5   Title pt displays normal c-spine AROM without limitation by neck pain by 10/14/15   Status On-going               Plan - 10/26/15 1000    Clinical Impression Statement Significant TP in L medial UT / LS today.  Good twitch response with needling and repeated twitch respsonse also noted with STM/TPR to same area. Still with POS ULTT on L.  Pt states neck pain seems worse on R typically  however, needling to L always more responsive and tender.  Exercises low intensity per neuro MD recommendations but are going very well.   PT Next Visit Plan dry needling and modalities/exercises to normalize R upper trap and suboccip area tone    Consulted and Agree with Plan of Care Patient        Problem List Patient Active Problem List   Diagnosis Date Noted  . Trapezius muscle spasm 09/15/2015  . Positive ANA (antinuclear antibody) 09/15/2015  . 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 CVA (cerebral infarction) 09/09/2015  . Asthma 09/09/2015  . AKI (acute kidney injury) (HCC) 09/09/2015  . 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 Vertebral artery dissection (HCC)   . Pap smear for cervical cancer screening 07/21/2015  . Right foot infection 06/21/2015  . Acute bacterial bronchitis 02/26/2015  . Influenza A 09/29/2014  . Acute bacterial sinusitis 09/29/2014  . Paronychia of  third finger of right hand 05/06/2014  . Cough variant asthma 05/15/2013  . Left foot pain 12/13/2012  . Traumatic loss of toenail 12/11/2012  . Sinusitis 09/13/2012  . Rash and nonspecific skin eruption 03/25/2012  . Superficial bruising of thigh 03/25/2012  . Dermatitis 02/23/2012  . Screening for malignant neoplasm of the cervix 12/14/2010  . Physical exam 12/14/2010  . Infertility, female 12/14/2010  . OTHER AND UNSPECIFIED OVARIAN CYST 05/10/2010  . VITAMIN D DEFICIENCY 05/06/2010  . GAS/BLOATING 05/06/2010  . UTI 11/17/2009  . ACUTE BRONCHITIS 08/13/2009  . NAUSEA 08/13/2009  . POLYDIPSIA 05/13/2009  . INJURY OF FACE AND NECK OTHER AND UNSPECIFIED 03/19/2009  . KNEE SPRAIN, LEFT, MEDIAL COLLATERAL LIGAMENT 11/06/2008  . FOOT, PAIN 10/29/2008  . Hypothyroidism 10/02/2008  . ANEMIA-NOS 10/02/2008  . ADD 10/02/2008  . MIGRAINE 10/02/2008  . RHINITIS 10/02/2008  . ABDOMINAL PAIN 10/02/2008  . BIPOLAR AFFECTIVE DISORDER, HX OF 10/02/2008    Milo Solana PT, OCS 10/26/2015, 10:22 AM  [facility] [address]  [address] [place], [place], [address] Phone: [phone]   Fax:  [phone]  Name: Teresa Guzman MRN: [id number] Date of Birth: [birth year]

## 2015-10-28 ENCOUNTER — Ambulatory Visit: Payer: BLUE CROSS/BLUE SHIELD | Admitting: Physical Therapy

## 2015-10-28 DIAGNOSIS — G729 Myopathy, unspecified: Secondary | ICD-10-CM | POA: Diagnosis not present

## 2015-10-28 DIAGNOSIS — M436 Torticollis: Secondary | ICD-10-CM

## 2015-10-28 DIAGNOSIS — M6289 Other specified disorders of muscle: Secondary | ICD-10-CM

## 2015-10-28 NOTE — Therapy (Signed)
[facility] [address]  [address] [place], [place], [address] Phone: [phone]   Fax:  [phone]  Physical Therapy Treatment  Patient Details  Name: Teresa Guzman MRN: [id number] Date of Birth: Jul 27, 1981 Referring Provider: Susa Raring, MD  Encounter Date: 10/28/2015      PT End of Session - 10/28/15 1533    Visit Number 11   Number of Visits 17   Date for PT Re-Evaluation 11/19/15   PT Start Time 1531   PT Stop Time 1620   PT Time Calculation (min) 49 min      Past Medical History  Diagnosis Date  . Endometriosis   . Hypothyroidism   . Stroke (HCC) 09/09/15    had CVA per pt.  . Asthma     Past Surgical History  Procedure Laterality Date  . Arm surgery Left 1987  . Cyst removal hand      There were no vitals filed for this visit.  Visit Diagnosis:  Muscle tightness  Neck stiffness      Subjective Assessment - 10/28/15 1533    Subjective States neck has been much more tight lately.  States work has been stressful lately and believes this is contributing to symptoms. Took muscle relaxor last night and states didn't seem to help.  States tries to stretch but "it just hurts".  She reports she often sits for hours working on computer while at work   Currently in Pain? Yes   Pain Score --  5-6/10   Pain Location Neck         TODAY'S TREATMENT Manual - STM and TPR to L medial UT and LS with several twitch responses noted.  Grade 3 UPA and CPA mobs to upper and mid t-spine. Dry needling (verbal informed consent given prior to treatment) performed with pt prone to L UT and R upper c-spine paraspinals (very tight in this area today). Good twitch response on L UT.  Several bouts of Premod E-stim (10-20Hz ) performed between 3 needles in R c-spine paraspinals.   TherEx - Prone W 10x3"          PT Long Term Goals - 10/22/15 1005    PT LONG TERM GOAL #1   Title Pt will be independent with HEP in  order to indicate improved functional mobility and decreased dizziness. (Target Date: 10/14/15)   Status Achieved   PT LONG TERM GOAL #2   Title Pt will report no more than 2/10 dizziness with functional mobility involving head turns to indicate vestibular adaptation.     Status Achieved   PT LONG TERM GOAL #3   Title Pt will report return to community activities without limitation of dizziness.     Status Achieved   PT LONG TERM GOAL #4   Title Pt will ambulate over varying outdoor surfaces at mod I level in order to demonstrate safe return to community and leisure activities.     Status Achieved   PT LONG TERM GOAL #5   Title pt displays normal c-spine AROM without limitation by neck pain by 10/14/15   Status On-going               Plan - 10/29/15 0836    Clinical Impression Statement pt with increased pain past couple days.  This seems likely due to large amount of time she spends sitting performing desk and computer work, states she has gone 4 hours without getting away from her desk at  times.  I encouraged her to make certain she gets up and moves around every 20-30 minutes and to roll shoulders and neck regularly to limit amount of tightness and strain she experiences.  Today's session focused and manual primarily to decrease her symptoms.   PT Next Visit Plan dry needling and modalities/exercises to normalize R upper trap and suboccip area tone    Consulted and Agree with Plan of Care Patient        Problem List Patient Active Problem List   Diagnosis Date Noted  . Trapezius muscle spasm 09/15/2015  . Positive ANA (antinuclear antibody) 09/15/2015  . 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 CVA (cerebral infarction) 09/09/2015  . Asthma 09/09/2015  . AKI (acute kidney injury) (HCC) 09/09/2015  . 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 Vertebral artery dissection (HCC)   . Pap smear for cervical cancer  screening 07/21/2015  . Right foot infection 06/21/2015  . Acute bacterial bronchitis 02/26/2015  . Influenza A 09/29/2014  . Acute bacterial sinusitis 09/29/2014  . Paronychia of third finger of right hand 05/06/2014  . Cough variant asthma 05/15/2013  . Left foot pain 12/13/2012  . Traumatic loss of toenail 12/11/2012  . Sinusitis 09/13/2012  . Rash and nonspecific skin eruption 03/25/2012  . Superficial bruising of thigh 03/25/2012  . Dermatitis 02/23/2012  . Screening for malignant neoplasm of the cervix 12/14/2010  . Physical exam 12/14/2010  . Infertility, female 12/14/2010  . OTHER AND UNSPECIFIED OVARIAN CYST 05/10/2010  . VITAMIN D DEFICIENCY 05/06/2010  . GAS/BLOATING 05/06/2010  . UTI 11/17/2009  . ACUTE BRONCHITIS 08/13/2009  . NAUSEA 08/13/2009  . POLYDIPSIA 05/13/2009  . INJURY OF FACE AND NECK OTHER AND UNSPECIFIED 03/19/2009  . KNEE SPRAIN, LEFT, MEDIAL COLLATERAL LIGAMENT 11/06/2008  . FOOT, PAIN 10/29/2008  . Hypothyroidism 10/02/2008  . ANEMIA-NOS 10/02/2008  . ADD 10/02/2008  . MIGRAINE 10/02/2008  . RHINITIS 10/02/2008  . ABDOMINAL PAIN 10/02/2008  . BIPOLAR AFFECTIVE DISORDER, HX OF 10/02/2008    Aidee Latimore PT, OCS 10/29/2015, 8:38 AM  [facility] [address]  [address] [place], [place], [address] Phone: [phone]   Fax:  [phone]  Name: Rudean HaskellDawn T Poole MRN: [id number] Date of Birth: 11/25/1980

## 2015-11-02 ENCOUNTER — Ambulatory Visit: Payer: BLUE CROSS/BLUE SHIELD | Admitting: Physical Therapy

## 2015-11-02 DIAGNOSIS — M436 Torticollis: Secondary | ICD-10-CM

## 2015-11-02 DIAGNOSIS — G729 Myopathy, unspecified: Secondary | ICD-10-CM | POA: Diagnosis not present

## 2015-11-02 DIAGNOSIS — M6289 Other specified disorders of muscle: Secondary | ICD-10-CM

## 2015-11-02 NOTE — Therapy (Signed)
[facility] [place] [address]  [address] [place], [place], [address] Phone: [phone]   Fax:  [phone]  Physical Therapy Treatment  Patient Details  Name: Teresa Guzman MRN: [id number] Date of Birth: 12/01/1980 Referring Provider: Susa RaringPrashant Singh, MD  Encounter Date: 11/02/2015      PT End of Session - 11/02/15 1624    Visit Number 12   Number of Visits 17   Date for PT Re-Evaluation 11/19/15   PT Start Time 1623   PT Stop Time 1706   PT Time Calculation (min) 43 min      Past Medical History  Diagnosis Date  . Endometriosis   . Hypothyroidism   . Stroke (HCC) 09/09/15    had CVA per pt.  . Asthma     Past Surgical History  Procedure Laterality Date  . Arm surgery Left 1987  . Cyst removal hand      There were no vitals filed for this visit.  Visit Diagnosis:  Muscle tightness  Neck stiffness      Subjective Assessment - 11/02/15 1625    Subjective States has been focusing on not sitting at desk/computer for prolonged periods since last treatment and states "it's doing wonders".  She states pain has been in the 2-3/10 range lately.  States has been performing HEP.   Currently in Pain? Yes   Pain Score --  2-3/10   Pain Location Neck   Pain Orientation Right         TODAY'S TREATMENT TherEx - UBE LVL 2.0 60"/60" Low Row 20# 15x3", 25# 15x Prone Over PBall (55cm) I 15x, W 15x, Y 15x - good tolerance and mechanics, some VC to maintain neutral c-spine Seated B Shoulder ABD 2# 2x15  Manual - L UE Nerve Glides; STM and TPR to B medial UT and LS; B 1st rib caudal glides grade 3 (states felt good on R).            PT Long Term Goals - 10/22/15 1005    PT LONG TERM GOAL #1   Title Pt will be independent with HEP in order to indicate improved functional mobility and decreased dizziness. (Target Date: 10/14/15)   Status Achieved   PT LONG TERM GOAL #2   Title Pt will report no more than 2/10  dizziness with functional mobility involving head turns to indicate vestibular adaptation.     Status Achieved   PT LONG TERM GOAL #3   Title Pt will report return to community activities without limitation of dizziness.     Status Achieved   PT LONG TERM GOAL #4   Title Pt will ambulate over varying outdoor surfaces at mod I level in order to demonstrate safe return to community and leisure activities.     Status Achieved   PT LONG TERM GOAL #5   Title pt displays normal c-spine AROM without limitation by neck pain by 10/14/15   Status On-going               Plan - 11/02/15 1818    Clinical Impression Statement pt states she has been focusing on taking more breaks at work and moving more.  States has noted significant benefit with this stating pain has beey 10/08/08 lately.  Tried some additional scapular exercises over pball today.  All were well tolerated and will add to HEP if no pain increase noted at next appointment.   PT Next Visit Plan dry needling and modalities/exercises  to normalize R upper trap and suboccip area tone    Consulted and Agree with Plan of Care Patient        Problem List Patient Active Problem List   Diagnosis Date Noted  . Trapezius muscle spasm 09/15/2015  . Positive ANA (antinuclear antibody) 09/15/2015  . 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 CVA (cerebral infarction) 09/09/2015  . Asthma 09/09/2015  . AKI (acute kidney injury) (HCC) 09/09/2015  . 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 Vertebral artery dissection (HCC)   . Pap smear for cervical cancer screening 07/21/2015  . Right foot infection 06/21/2015  . Acute bacterial bronchitis 02/26/2015  . Influenza A 09/29/2014  . Acute bacterial sinusitis 09/29/2014  . Paronychia of third finger of right hand 05/06/2014  . Cough variant asthma 05/15/2013  . Left foot pain 12/13/2012  . Traumatic loss of toenail 12/11/2012  .  Sinusitis 09/13/2012  . Rash and nonspecific skin eruption 03/25/2012  . Superficial bruising of thigh 03/25/2012  . Dermatitis 02/23/2012  . Screening for malignant neoplasm of the cervix 12/14/2010  . Physical exam 12/14/2010  . Infertility, female 12/14/2010  . OTHER AND UNSPECIFIED OVARIAN CYST 05/10/2010  . VITAMIN D DEFICIENCY 05/06/2010  . GAS/BLOATING 05/06/2010  . UTI 11/17/2009  . ACUTE BRONCHITIS 08/13/2009  . NAUSEA 08/13/2009  . POLYDIPSIA 05/13/2009  . INJURY OF FACE AND NECK OTHER AND UNSPECIFIED 03/19/2009  . KNEE SPRAIN, LEFT, MEDIAL COLLATERAL LIGAMENT 11/06/2008  . FOOT, PAIN 10/29/2008  . Hypothyroidism 10/02/2008  . ANEMIA-NOS 10/02/2008  . ADD 10/02/2008  . MIGRAINE 10/02/2008  . RHINITIS 10/02/2008  . ABDOMINAL PAIN 10/02/2008  . BIPOLAR AFFECTIVE DISORDER, HX OF 10/02/2008    Chyla Schlender PT, OCS 11/02/2015, 6:20 PM  [facility] [address]  [address] [place], [place], [address] Phone: [phone]   Fax:  [phone]  Name: Teresa Guzman MRN: [id number] Date of Birth: Jul 09, 1981

## 2015-11-05 ENCOUNTER — Ambulatory Visit: Payer: BLUE CROSS/BLUE SHIELD | Admitting: Physical Therapy

## 2015-11-05 DIAGNOSIS — M436 Torticollis: Secondary | ICD-10-CM

## 2015-11-05 DIAGNOSIS — M6289 Other specified disorders of muscle: Secondary | ICD-10-CM

## 2015-11-05 DIAGNOSIS — G729 Myopathy, unspecified: Secondary | ICD-10-CM | POA: Diagnosis not present

## 2015-11-05 NOTE — Therapy (Signed)
[facility] [address]  [address] [place], [place], [address] Phone: [phone]   Fax:  [phone]  Physical Therapy Treatment  Patient Details  Name: Teresa Guzman MRN: [id number] Date of Birth: 18-Mar-1981 Referring Provider: Susa Raring, MD  Encounter Date: 11/05/2015      PT End of Session - 11/05/15 0802    Visit Number 13   Number of Visits 17   Date for PT Re-Evaluation 11/19/15   PT Start Time 0800   PT Stop Time 0850   PT Time Calculation (min) 50 min      Past Medical History  Diagnosis Date  . Endometriosis   . Hypothyroidism   . Stroke (HCC) 09/09/15    had CVA per pt.  . Asthma     Past Surgical History  Procedure Laterality Date  . Arm surgery Left 1987  . Cyst removal hand      There were no vitals filed for this visit.  Visit Diagnosis:  Muscle tightness  Neck stiffness      Subjective Assessment - 11/05/15 0820    Subjective States pain continues to run 2-3/10 range.  States is still making certain to move around a great deal while at work and this is keeping pain under control. States has only had 1 headache this week and "wasn't even a terrible one"   Currently in Pain? Yes   Pain Score --  2-3/10 lately   Pain Location Neck            [facility] PT Assessment - 11/05/15 0001    Assessment   Next MD Visit 11/25/15        TODAY'S TREATMENT TherEx - UBE LVL 2.0 60"/60" Low Row 25# 15x TRX High Row 15x (kept pt far from unit to keep intensity low) TRX "W" 15x (kept pt far from unit to keep intensity low) TRX "Y" 15x (kept pt far from unit to keep intensity low) Seated PBall Diagonal lat stretch 5x5-10" each Corner Pec Stretch/chin tuck with Corner push out combo (20"/10x)2x  Manual - L UE Nerve Glides; STM and TPR to B medial UT and LS; B 1st rib caudal glides grade 3 (states felt good on R). STM B Scalenes with gentle stretch to same.             PT Long Term  Goals - 10/22/15 1005    PT LONG TERM GOAL #1   Title Pt will be independent with HEP in order to indicate improved functional mobility and decreased dizziness. (Target Date: 10/14/15)   Status Achieved   PT LONG TERM GOAL #2   Title Pt will report no more than 2/10 dizziness with functional mobility involving head turns to indicate vestibular adaptation.     Status Achieved   PT LONG TERM GOAL #3   Title Pt will report return to community activities without limitation of dizziness.     Status Achieved   PT LONG TERM GOAL #4   Title Pt will ambulate over varying outdoor surfaces at mod I level in order to demonstrate safe return to community and leisure activities.     Status Achieved   PT LONG TERM GOAL #5   Title pt displays normal c-spine AROM without limitation by neck pain by 10/14/15   Status On-going               Plan - 11/05/15 0945    Clinical Impression Statement more soft tissue work  on scalenes today - minimal to no TTP in UT/LS area but still tight and tender in scalenes.  Performed well with exercises but intensity still very low and working on mechanics of movements more than resistance to reinforce posture correction.  She reports improvement in level of pain lately and only recalls one mild headache over the past week.   PT Next Visit Plan dry needling and modalities/exercises to normalize R upper trap and suboccip area tone; gentle scapular exercises   Consulted and Agree with Plan of Care Patient        Problem List Patient Active Problem List   Diagnosis Date Noted  . Trapezius muscle spasm 09/15/2015  . Positive ANA (antinuclear antibody) 09/15/2015  . 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 CVA (cerebral infarction) 09/09/2015  . Asthma 09/09/2015  . AKI (acute kidney injury) (HCC) 09/09/2015  . 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 Vertebral artery dissection (HCC)   . Pap smear for  cervical cancer screening 07/21/2015  . Right foot infection 06/21/2015  . Acute bacterial bronchitis 02/26/2015  . Influenza A 09/29/2014  . Acute bacterial sinusitis 09/29/2014  . Paronychia of third finger of right hand 05/06/2014  . Cough variant asthma 05/15/2013  . Left foot pain 12/13/2012  . Traumatic loss of toenail 12/11/2012  . Sinusitis 09/13/2012  . Rash and nonspecific skin eruption 03/25/2012  . Superficial bruising of thigh 03/25/2012  . Dermatitis 02/23/2012  . Screening for malignant neoplasm of the cervix 12/14/2010  . Physical exam 12/14/2010  . Infertility, female 12/14/2010  . OTHER AND UNSPECIFIED OVARIAN CYST 05/10/2010  . VITAMIN D DEFICIENCY 05/06/2010  . GAS/BLOATING 05/06/2010  . UTI 11/17/2009  . ACUTE BRONCHITIS 08/13/2009  . NAUSEA 08/13/2009  . POLYDIPSIA 05/13/2009  . INJURY OF FACE AND NECK OTHER AND UNSPECIFIED 03/19/2009  . KNEE SPRAIN, LEFT, MEDIAL COLLATERAL LIGAMENT 11/06/2008  . FOOT, PAIN 10/29/2008  . Hypothyroidism 10/02/2008  . ANEMIA-NOS 10/02/2008  . ADD 10/02/2008  . MIGRAINE 10/02/2008  . RHINITIS 10/02/2008  . ABDOMINAL PAIN 10/02/2008  . BIPOLAR AFFECTIVE DISORDER, HX OF 10/02/2008    Jaiven Graveline PT, OCS 11/05/2015, 9:48 AM  [facility] [place] [address]  [address] [place], [place], [address] Phone: [phone]   Fax:  [phone]  Name: Rudean HaskellDawn T Safi MRN: [id number] Date of Birth: 08/05/1981

## 2015-11-08 ENCOUNTER — Telehealth: Payer: Self-pay

## 2015-11-08 DIAGNOSIS — I7774 Dissection of vertebral artery: Secondary | ICD-10-CM

## 2015-11-08 DIAGNOSIS — I63341 Cerebral infarction due to thrombosis of right cerebellar artery: Secondary | ICD-10-CM

## 2015-11-08 NOTE — Telephone Encounter (Signed)
Right cerebellar infarct, thrombotic secondary to right vertebral dissection. Etiology unclear. May have been due to excessive exercise. Symptoms started prior to exercise but she does vigorous exercise daily. There is question of possible fibromuscular dysplasia

## 2015-11-08 NOTE — Telephone Encounter (Signed)
Pt aware.

## 2015-11-08 NOTE — Telephone Encounter (Signed)
-----   Message from Richarda OverlieJada A Alexius Hangartner, [place] sent at 09/29/2015  1:23 PM EST ----- MRA Neck w/ contrast.

## 2015-11-09 ENCOUNTER — Ambulatory Visit: Payer: BLUE CROSS/BLUE SHIELD | Attending: Internal Medicine | Admitting: Physical Therapy

## 2015-11-09 DIAGNOSIS — M436 Torticollis: Secondary | ICD-10-CM | POA: Insufficient documentation

## 2015-11-09 DIAGNOSIS — G729 Myopathy, unspecified: Secondary | ICD-10-CM | POA: Diagnosis not present

## 2015-11-09 DIAGNOSIS — M6289 Other specified disorders of muscle: Secondary | ICD-10-CM

## 2015-11-09 NOTE — Therapy (Signed)
[facility] [address]  [address] [place], [place], [address] Phone: [phone]   Fax:  [phone]  Physical Therapy Treatment  Patient Details  Name: Teresa Guzman MRN: [id number] Date of Birth: July 04, 1981 Referring Provider: Susa Raring, MD  Encounter Date: 11/09/2015      PT End of Session - 11/09/15 0807    Visit Number 14   Number of Visits 17   Date for PT Re-Evaluation 11/19/15   PT Start Time 0805   PT Stop Time 0850   PT Time Calculation (min) 45 min      Past Medical History  Diagnosis Date  . Endometriosis   . Hypothyroidism   . Stroke (HCC) 09/09/15    had CVA per pt.  . Asthma     Past Surgical History  Procedure Laterality Date  . Arm surgery Left 1987  . Cyst removal hand      There were no vitals filed for this visit.  Visit Diagnosis:  Muscle tightness  Neck stiffness      Subjective Assessment - 11/09/15 0808    Subjective States had very busy weekend and no time to perform HEP.  In addition she states work has been quite stressful lately. States is feeling quite stiff and tight today.   Currently in Pain? Yes   Pain Score 3    Pain Location Neck   Pain Orientation Right           TODAY'S TREATMENT Manual - L side-lying R scap distraction grade 3; STM/TPR mid and upper R scapular mms Supine STM and TPR to B medial UT and LS; B 1st rib caudal glides grade 3 (states felt good on R); STM B Scalenes with gentle stretch to same.  TherEx - L Side-Lying R shoulder Horiz ABD AROM 10x Hooklying pullover 8# 15x Hooklying B Horiz ABD Blue TB 15x Hooklying B Horiz ADD 4# 15x             PT Long Term Goals - 10/22/15 1005    PT LONG TERM GOAL #1   Title Pt will be independent with HEP in order to indicate improved functional mobility and decreased dizziness. (Target Date: 10/14/15)   Status Achieved   PT LONG TERM GOAL #2   Title Pt will report no more than 2/10 dizziness  with functional mobility involving head turns to indicate vestibular adaptation.     Status Achieved   PT LONG TERM GOAL #3   Title Pt will report return to community activities without limitation of dizziness.     Status Achieved   PT LONG TERM GOAL #4   Title Pt will ambulate over varying outdoor surfaces at mod I level in order to demonstrate safe return to community and leisure activities.     Status Achieved   PT LONG TERM GOAL #5   Title pt displays normal c-spine AROM without limitation by neck pain by 10/14/15   Status On-going               Plan - 11/09/15 0847    Clinical Impression Statement Very good performance with exercises; some tightness in R scalenes and TPR in LS area addressed with manual rather than needling.  Advised pt to provide feeback with regard to what treatment modalities seem to offer most benefit.   PT Next Visit Plan possible [place]? dry needling and modalities/exercises to normalize R upper trap and suboccip area tone; gentle scapular exercises  Consulted and Agree with Plan of Care Patient        Problem List Patient Active Problem List   Diagnosis Date Noted  . Trapezius muscle spasm 09/15/2015  . Positive ANA (antinuclear antibody) 09/15/2015  . 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 CVA (cerebral infarction) 09/09/2015  . Asthma 09/09/2015  . AKI (acute kidney injury) (HCC) 09/09/2015  . 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 Vertebral artery dissection (HCC)   . Pap smear for cervical cancer screening 07/21/2015  . Right foot infection 06/21/2015  . Acute bacterial bronchitis 02/26/2015  . Influenza A 09/29/2014  . Acute bacterial sinusitis 09/29/2014  . Paronychia of third finger of right hand 05/06/2014  . Cough variant asthma 05/15/2013  . Left foot pain 12/13/2012  . Traumatic loss of toenail 12/11/2012  . Sinusitis 09/13/2012  . Rash and nonspecific skin eruption  03/25/2012  . Superficial bruising of thigh 03/25/2012  . Dermatitis 02/23/2012  . Screening for malignant neoplasm of the cervix 12/14/2010  . Physical exam 12/14/2010  . Infertility, female 12/14/2010  . OTHER AND UNSPECIFIED OVARIAN CYST 05/10/2010  . VITAMIN D DEFICIENCY 05/06/2010  . GAS/BLOATING 05/06/2010  . UTI 11/17/2009  . ACUTE BRONCHITIS 08/13/2009  . NAUSEA 08/13/2009  . POLYDIPSIA 05/13/2009  . INJURY OF FACE AND NECK OTHER AND UNSPECIFIED 03/19/2009  . KNEE SPRAIN, LEFT, MEDIAL COLLATERAL LIGAMENT 11/06/2008  . FOOT, PAIN 10/29/2008  . Hypothyroidism 10/02/2008  . ANEMIA-NOS 10/02/2008  . ADD 10/02/2008  . MIGRAINE 10/02/2008  . RHINITIS 10/02/2008  . ABDOMINAL PAIN 10/02/2008  . BIPOLAR AFFECTIVE DISORDER, HX OF 10/02/2008    Shaquille Murdy PT, OCS 11/09/2015, 8:52 AM  [facility] [place] [address]  [address] [place], [place], [address] Phone: [phone]   Fax:  [phone]  Name: Teresa Guzman MRN: [id number] Date of Birth: 02/07/1981

## 2015-11-12 ENCOUNTER — Telehealth: Payer: Self-pay

## 2015-11-12 ENCOUNTER — Ambulatory Visit: Payer: BLUE CROSS/BLUE SHIELD | Admitting: Physical Therapy

## 2015-11-12 ENCOUNTER — Telehealth: Payer: Self-pay | Admitting: Neurology

## 2015-11-12 DIAGNOSIS — G729 Myopathy, unspecified: Secondary | ICD-10-CM | POA: Diagnosis not present

## 2015-11-12 DIAGNOSIS — I7774 Dissection of vertebral artery: Secondary | ICD-10-CM

## 2015-11-12 DIAGNOSIS — M436 Torticollis: Secondary | ICD-10-CM

## 2015-11-12 DIAGNOSIS — M6289 Other specified disorders of muscle: Secondary | ICD-10-CM

## 2015-11-12 NOTE — Progress Notes (Signed)
This encounter was created in error - please disregard.

## 2015-11-12 NOTE — Therapy (Addendum)
[facility] [place] [address]  [place], [place], [address] Phone: [phone]   Fax:  [phone]  Physical Therapy Treatment  Patient Details  Name: Teresa Guzman MRN: [id number] Date of Birth: Dec 04, 1980 Referring Provider: Lala Lund, MD  Encounter Date: 11/12/2015      PT End of Session - 11/12/15 0808    Visit Number 15   Number of Visits 17   Date for PT Re-Evaluation 11/19/15   PT Start Time 0805   PT Stop Time 7078   PT Time Calculation (min) 47 min            Subjective Assessment - 11/12/15 0808    Subjective States she feels good for a couple days after each treatment but then back to feeling tight.  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   Currently in Pain? Yes   Pain Score 2    Pain Location Neck            [facility] PT Assessment - 11/12/15 0001    AROM   Cervical Extension 46  45 after TherEx   Cervical - Right Side Bend 26  26 after TherEx   Cervical - Left Side Bend 26  28 after TherEx   Cervical - Right Rotation 62  65 after TherEx   Cervical - Left Rotation 56  56 after TherEx         TODAY'S TREATMENT C-Spine AROM prior to treatment  TherEx - UBE lvl 1.5 90"/90" Low Row 25# 15x Corner Pec Stretch with chin tuck  2x20" Corner Push-out 2x10 High Row 20# 15x Seated Pball (65CM) rollout for lat stretch 5x5-10" each Standing B Shoulder ABD 2# 15x Narrow Pulldown 25# 15x  Manual - pt supine STM and stretching to B UT, Scalenes, and LS.          PT Long Term Goals - 11/12/15 1218    PT LONG TERM GOAL #1   Title Pt will be independent with HEP in order to indicate improved functional mobility and decreased dizziness.   Status Achieved   PT LONG TERM GOAL #2   Title Pt will report no more than 2/10 dizziness with functional mobility  involving head turns to indicate vestibular adaptation.     Status Achieved   PT LONG TERM GOAL #3   Title Pt will report return to community activities without limitation of dizziness.     Status Achieved   PT LONG TERM GOAL #4   Title Pt will ambulate over varying outdoor surfaces at mod I level in order to demonstrate safe return to community and leisure activities.     Status Achieved   PT LONG TERM GOAL #5   Title pt displays normal c-spine AROM without limitation by neck pain  mild restrictions   Status Not Met               Plan - 11/12/15 1032    Clinical Impression Statement Teresa Guzman has attended 15 PT treatments 10 of which were to address neck pain and stiffness and the other 5 to address dizziness/vertigo.  Her dizziness has resolved.  She states the intensity of her neck pain has improved and headache frequency has decreaesd; however, she continues to note neck / upper scapular pain rating 2-4/10 while at work with desk/computer work.  She is performing HEP and is working on correcting posture and taking more micro breaks while at work to limit neck/upper back strain. Her c-spine AROM displays mild restrictions; however, supine PROM is completely WNL without pain.  Due to progressing initially with PT but currently plateauing, we are placing Teresa Guzman on hold on from PT at this time for up to 30 days.  Should her pain increase or should she feel as though she requires further PT I advised her to contact our office.   PT Next Visit Plan pt on hold from PT   Consulted and Agree with Plan of Care Patient      Patient will benefit from skilled therapeutic intervention in order to improve the following deficits and impairments:  Impaired flexibility, Postural dysfunction, Pain, Improper body mechanics, Decreased range of motion  Visit Diagnosis: Muscle tightness  Neck stiffness     Ananya Mccleese PT, OCS 11/12/2015, 12:21 PM  [facility] [address]  [place], [place], [address] Phone: [phone]   Fax:  [phone]  Name: Arian Mcquitty Edler MRN: [id number] Date of Birth: [birth year]   PHYSICAL THERAPY DISCHARGE SUMMARY  Visits from Start of Care: 15  Current functional level related to goals / functional outcomes: Teresa Guzman was seen for 15 OPPT treatments to address dizziness/vertigo as well as neck pain/stiffness.  Vertigo basically abolished and neck pain improved.  She states she is able to reduce amount of neck pain/tightness she experiences throughout her workday by monitoring her posture/body mechanics and by making certain to avoid prolonged static postures during computer work.  Her symptoms are still present but less intense and do not cause significant reduction in level of function.  She was last seen on 11/12/15 at which time she was placed on hold while she performed HEP, and was to contact us if her pain increased.  We haven't heard from her so she is being discharged from our care at this time.   Remaining deficits: Intermittent neck pain and stiffness   Education / Equipment: HEP and posture/body Surveyor, mining Plan: Patient agrees to discharge.  Patient goals were partially met. Patient is being discharged due to being pleased with the current functional level.  ?????        Leonette Most PT, OCS 12/29/2015 7:56 AM

## 2015-11-12 NOTE — Telephone Encounter (Signed)
Patient Teresa Guzman 12/22/1980 called and would like you to call her back [phone]. Thank you

## 2015-11-12 NOTE — Telephone Encounter (Signed)
Received fax from phone service that states, "Caller state she was already given out of work not, but work requires not to say pt has been under Dr. Moises BloodJaffe's care since pt had the stroke." Unsure what pt is talking about. Had a note out of work in Feb. And a return to work note done in March. Neither state "pt has been under Dr.Jaffe's care since stroke", left a message for pt to call back to clarify what she needs.

## 2015-11-15 ENCOUNTER — Telehealth: Payer: Self-pay

## 2015-11-15 ENCOUNTER — Encounter: Payer: Self-pay | Admitting: Family Medicine

## 2015-11-15 MED ORDER — AZELASTINE HCL 0.15 % NA SOLN: NASAL | Status: DC

## 2015-11-15 MED ORDER — MONTELUKAST SODIUM 10 MG PO TABS: ORAL_TABLET | ORAL | Status: DC

## 2015-11-15 NOTE — Telephone Encounter (Signed)
Medication filled to pharmacy as requested.   

## 2015-11-15 NOTE — Telephone Encounter (Signed)
Scheduled for 11/25/15 @ 12:20 p.m. Pt aware.

## 2015-11-15 NOTE — Telephone Encounter (Signed)
MRI Angiogram approved. Will schedule and contact w/ with date and time.

## 2015-11-25 ENCOUNTER — Ambulatory Visit
Admission: RE | Admit: 2015-11-25 | Discharge: 2015-11-25 | Disposition: A | Discharge status: Home / Self Care | Payer: BLUE CROSS/BLUE SHIELD | Source: Ambulatory Visit | Attending: Neurology | Admitting: Neurology

## 2015-11-25 ENCOUNTER — Ambulatory Visit: Payer: BLUE CROSS/BLUE SHIELD | Admitting: Neurology

## 2015-11-25 DIAGNOSIS — I7774 Dissection of vertebral artery: Secondary | ICD-10-CM | POA: Diagnosis not present

## 2015-11-25 MED ORDER — GADOBENATE DIMEGLUMINE 529 MG/ML IV SOLN
11.0000 mL | Freq: Once | INTRAVENOUS | Status: AC | PRN
  Administered 2015-11-25: 11 mL via INTRAVENOUS

## 2015-12-02 ENCOUNTER — Encounter: Payer: Self-pay | Admitting: Neurology

## 2015-12-02 ENCOUNTER — Ambulatory Visit (INDEPENDENT_AMBULATORY_CARE_PROVIDER_SITE_OTHER): Payer: BLUE CROSS/BLUE SHIELD | Admitting: Neurology

## 2015-12-02 VITALS — BP 110/80 | HR 101 | Ht 60.0 in | Wt 135.0 lb

## 2015-12-02 DIAGNOSIS — R51 Headache: Secondary | ICD-10-CM | POA: Diagnosis not present

## 2015-12-02 DIAGNOSIS — R519 Headache, unspecified: Secondary | ICD-10-CM

## 2015-12-02 DIAGNOSIS — I7774 Dissection of vertebral artery: Secondary | ICD-10-CM

## 2015-12-02 DIAGNOSIS — I63111 Cerebral infarction due to embolism of right vertebral artery: Secondary | ICD-10-CM

## 2015-12-02 MED ORDER — CYCLOBENZAPRINE HCL 10 MG PO TABS: 10.0000 mg | ORAL_TABLET | Freq: Three times a day (TID) | ORAL | Status: DC | PRN

## 2015-12-02 NOTE — Patient Instructions (Addendum)
1.  Stop aspirin but continue Plavix 75mg  daily 2.  We will increase nortriptyline to 50mg  at bedtime.  3.  May use cyclobenzaprine up to 3 times a day as needed for neck pain.  Caution for drowsiness. 4.  We will perform CTA of neck in one month 5.  Limit exercise to cardiovascular, particularly stationary bike or elliptical.  ADDENDUM:  I called and spoke with patient stating that she should refrain from all exercise for now. 6.  Do not lift heavy objects 7.  Follow up in 5 months.

## 2015-12-02 NOTE — Progress Notes (Addendum)
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  She is doing well.  For headaches, she is taking nortriptyline 20mg  at bedtime.  It has helped the headaches.  She gets them 3 or 4 days a week now.  She notes neck pain and tightness on the right side when she turns her head.  She has not been exercising but is anxious to start again.  Repeat MRA of the neck from 11/25/15 appears unchanged, revealing right vertebral dissection but vessel is patent and without pseudoaneurysm.  However, it is difficult to really visualize the vessel since it is small.  HISTORY: On the morning of 09/09/15, she woke up with a "crick" in her neck.  She drove to the gym and while in the parking lot, she felt unsteady and was leaning towards the right.  She participated in her cross-fit class.  Afterwards, she felt worse.  She was falling to the right and became plegic on the right side.  She was [facility].  MRI of brain revealed acute right cerebellar infarct.  MRA neck revealed right vertebral artery stenosis.  Follow up CTA of neck confirmed right vertebral artery focal dissection.  There was segment irregularity in the right vertebral artery which raised suspicion of fibromuscular dysplasia.  Echo was normal.  She had nonspecific elevation of ANA.  Hgb A1c 5.7%.  LDL was 76.  She was started on Lipitor 10mg  daily.  She was started on ASA 325mg  and Plavix 75mg  daily.  Strength and balance has returned.  She feels a little dizzy at times, when she stands up or gets out of bed.  She continues to have a constant right sided neck and headache, for which she takes Tylenol. 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   Lights (such as from the computer screen) are triggers and has been out of work.  She does have history of right sided migraines.  PAST MEDICAL HISTORY: Past Medical History  Diagnosis Date  . Endometriosis   . Hypothyroidism   . Stroke (HCC) 09/09/15    had CVA per pt.  . Asthma     MEDICATIONS: Current Outpatient Prescriptions on File Prior to Visit  Medication Sig Dispense Refill  . Azelastine HCl 0.15 % SOLN use 2 sprays in each  nostril two times daily 90 mL 1  . clopidogrel (PLAVIX) 75 MG tablet Take 1 tablet (75 mg total) by mouth daily. 30 tablet 3  . fluticasone (FLONASE) 50 MCG/ACT nasal spray USE 1 TO 2 SPRAYS INTO THE  NOSE DAILY 32 g 2  . levothyroxine (SYNTHROID, LEVOTHROID) 100 MCG tablet Take 1 tablet (100 mcg total) by mouth daily. 30 tablet 6  . loratadine-pseudoephedrine (CLARITIN-D 12-HOUR) 5-120 MG tablet Take 1 tablet by mouth 2 (two) times daily.    . montelukast (SINGULAIR) 10 MG tablet Take 1 tablet by mouth at  bedtime 90 tablet 1  . Multiple Vitamin (MULTIVITAMIN WITH MINERALS) TABS tablet Take 1 tablet by mouth daily. Reported on 09/29/2015    . nortriptyline (PAMELOR) 10 MG capsule Take 2 capsules (20 mg total) by mouth at bedtime. 60 capsule 2  . saccharomyces boulardii (FLORASTOR) 250 MG capsule Take  250 mg by mouth 2 (two) times daily. Reported on 09/29/2015    . TURMERIC PO Take 1 Dose by mouth daily. Reported on 09/29/2015    . Vitamin D, Ergocalciferol, (DRISDOL) 50000 UNITS CAPS capsule Take 1 capsule (50,000 Units total) by mouth every 7 (seven) days. 12 capsule 0   No current facility-administered medications on file prior to visit.    ALLERGIES: No Known Allergies  FAMILY HISTORY: Family History  Problem Relation Age of Onset  . Colon cancer Brother 30  . Hyperlipidemia Mother   . Hypertension Father   . Hyperlipidemia Father   . Heart attack Father   . Diabetes Father     SOCIAL HISTORY: Social History   Social History  .  Marital Status: Married    Spouse Name: N/A  . Number of Children: N/A  . Years of Education: N/A   Occupational History  . Not on file.   Social History Main Topics  . Smoking status: Never Smoker   . Smokeless tobacco: Never Used  . Alcohol Use: No     Comment: occ.  . Drug Use: No  . Sexual Activity: Not on file   Other Topics Concern  . Not on file   Social History Narrative    REVIEW OF SYSTEMS: Constitutional: No fevers, chills, or sweats, no generalized fatigue, change in appetite Eyes: No visual changes, double vision, eye pain Ear, nose and throat: No hearing loss, ear pain, nasal congestion, sore throat Cardiovascular: No chest pain, palpitations Respiratory:  No shortness of breath at rest or with exertion, wheezes GastrointestinaI: No nausea, vomiting, diarrhea, abdominal pain, fecal incontinence Genitourinary:  No dysuria, urinary retention or frequency Musculoskeletal:  No neck pain, back pain Integumentary: No rash, pruritus, skin lesions Neurological: as above Psychiatric: No depression, insomnia, anxiety Endocrine: No palpitations, fatigue, diaphoresis, mood swings, change in appetite, change in weight, increased thirst Hematologic/Lymphatic:  No anemia, purpura, petechiae. Allergic/Immunologic: no itchy/runny eyes, nasal congestion, recent allergic reactions, rashes  PHYSICAL EXAM: Filed Vitals:   12/02/15 1515  BP: 110/80  Pulse: 101   General: No acute distress.  Patient appears well-groomed.  normal body habitus. Head:  Normocephalic/atraumatic Eyes:  Fundi examined but not visualized Neck: supple, no paraspinal tenderness, full range of motion Heart:  Regular rate and rhythm Lungs:  Clear to auscultation bilaterally Back: No paraspinal tenderness Neurological Exam: alert and oriented to person, place, and time. Attention span and concentration intact, recent and remote memory intact, fund of knowledge intact.  Speech fluent and not dysarthric,  language intact.  CN II-XII intact. Bulk and tone normal, muscle strength 5/5 throughout.  Sensation to light touch, temperature and vibration intact.  Deep tendon reflexes 2+ throughout, toes downgoing.  Finger to nose and heel to shin testing intact.  Gait normal, Romberg negative.  IMPRESSION: Right cerebellar infarct, thrombotic secondary to right vertebral dissection, probably traumatic related to vigorous exercise (Crossfit)  Right sided headache, secondary to above.  Also has migraine  PLAN: 1.  Discontinue aspirin.  Continue Plavix  daily 2.  Increase nortriptyline to  at bedtime 3.  Flexeril  up to 3 times daily as needed.  Caution for drowsiness so first take it while at home and not going anywhere. 4.  At this point, she may gradually start cardiovascular exercise such as stationary bike and elliptical.  No weights or any activity that will cause aggressive neck motion. 5.  Dissection appears unchanged on MRA, however CT would be more revealing.  Repeat CTA  of neck in 1 month 6.  Follow up in few months  27 minutes spent face to face with patient, over 50% spent discussing management.  Shon Millet, DO  CC:  [name], MD

## 2015-12-03 ENCOUNTER — Telehealth: Payer: Self-pay

## 2015-12-03 NOTE — Telephone Encounter (Signed)
-----   Message from Drema DallasAdam R Jaffe, DO sent at 12/03/2015  7:18 AM EDT -----  Could you let Teresa Guzman that I contacted with a vascular specialist about her situation.  She may gradually start cardiovascular exercise such as stationary bike and elliptical.  No weights or any activity that will cause aggressive neck motion.

## 2015-12-03 NOTE — Telephone Encounter (Signed)
Mychart message sent.

## 2015-12-08 ENCOUNTER — Encounter: Payer: Self-pay | Admitting: Podiatry

## 2015-12-08 ENCOUNTER — Ambulatory Visit (INDEPENDENT_AMBULATORY_CARE_PROVIDER_SITE_OTHER): Payer: BLUE CROSS/BLUE SHIELD | Admitting: Podiatry

## 2015-12-08 VITALS — BP 123/92 | HR 74 | Resp 16

## 2015-12-08 DIAGNOSIS — B49 Unspecified mycosis: Secondary | ICD-10-CM

## 2015-12-08 DIAGNOSIS — R21 Rash and other nonspecific skin eruption: Secondary | ICD-10-CM | POA: Diagnosis not present

## 2015-12-08 MED ORDER — TERBINAFINE HCL 250 MG PO TABS: 250.0000 mg | ORAL_TABLET | Freq: Every day | ORAL | Status: DC

## 2015-12-08 NOTE — Patient Instructions (Signed)

## 2015-12-09 NOTE — Progress Notes (Signed)
Subjective:     Patient ID: Teresa Guzman, female   DOB: 03/01/1981, 35 y.o.   MRN: [id number]  HPI 35 year old female presents the office today for concerns of a skin lesion bilaterally the right side worse than the left. This been ongoing for several months and she has been placed on 2 antibiotics as well as multiple creams to help. She states that the area itches quite a bit and is very dry and red on the bottom of her foot. She states that the areas will occasionally blister and drain clear drainage. He denies any pus. No other complaints.  Review of Systems  All other systems reviewed and are negative.      Objective:   Physical Exam General: AAO x3, NAD  Dermatological: On the plantar aspect of the foot bilaterally the right side worse than the left as a area of dry, scaly, erythematous skin mostly along submetatarsal one and into the first interspace on the right foot. Small areas in the arch the left foot. There appears to be no drainage today and there is no pus. There is no ascending synovitis. There is no edema.  Vascular: Dorsalis Pedis artery and Posterior Tibial artery pedal pulses are 2/4 bilateral with immedate capillary fill time. Pedal hair growth present. No varicosities and no lower extremity edema present bilateral. There is no pain with calf compression, swelling, warmth, erythema.   Neruologic: Grossly intact via light touch bilateral. Vibratory intact via tuning fork bilateral. Protective threshold with Semmes Wienstein monofilament intact to all pedal sites bilateral. Patellar and Achilles deep tendon reflexes 2+ bilateral. No Babinski or clonus noted bilateral.   Musculoskeletal: No gross boney pedal deformities bilateral. No pain, crepitus, or limitation noted with foot and ankle range of motion bilateral. Muscular strength 5/5 in all groups tested bilateral.  Gait: Unassisted, Nonantalgic.      Assessment:     35 year old female likely fungal infection right side  worse than left    Plan:     -Treatment options discussed including all alternatives, risks, and complications -Etiology of symptoms were discussed -At this point I recommend to start with 2 weeks of oral Lamisil. This is ordered today. Discussed risks and side effects the medication for which she understands. -Follow-up with me in 2 weeks or sooner if any issues are to arise. Call questions or concerns.  Ovid CurdMatthew Wagoner, DPM

## 2015-12-20 ENCOUNTER — Telehealth: Payer: Self-pay

## 2015-12-20 DIAGNOSIS — I7774 Dissection of vertebral artery: Secondary | ICD-10-CM

## 2015-12-20 DIAGNOSIS — I63111 Cerebral infarction due to embolism of right vertebral artery: Secondary | ICD-10-CM

## 2015-12-20 MED ORDER — NORTRIPTYLINE HCL 25 MG PO CAPS: 25.0000 mg | ORAL_CAPSULE | Freq: Every day | ORAL | Status: DC

## 2015-12-20 NOTE — Telephone Encounter (Signed)
Pt aware. Refill of the Nortriptyline also sent in at pt's request.

## 2015-12-20 NOTE — Telephone Encounter (Signed)
-----   Message from Richarda OverlieJada A Atlantis Delong, [place] sent at 12/02/2015  3:45 PM EDT ----- CTA NECK

## 2015-12-20 NOTE — Telephone Encounter (Signed)
CT angio neck ordered. Mychart message sent w/ info to pt. Will call after lunch if pt has not responded yet via mychart to acknowledge message.

## 2015-12-22 ENCOUNTER — Ambulatory Visit (INDEPENDENT_AMBULATORY_CARE_PROVIDER_SITE_OTHER): Payer: BLUE CROSS/BLUE SHIELD | Admitting: Podiatry

## 2015-12-22 ENCOUNTER — Encounter: Payer: Self-pay | Admitting: Podiatry

## 2015-12-22 DIAGNOSIS — R21 Rash and other nonspecific skin eruption: Secondary | ICD-10-CM

## 2015-12-22 DIAGNOSIS — L309 Dermatitis, unspecified: Secondary | ICD-10-CM

## 2015-12-22 MED ORDER — DOXYCYCLINE HYCLATE 100 MG PO TABS: 100.0000 mg | ORAL_TABLET | Freq: Two times a day (BID) | ORAL | Status: DC

## 2015-12-23 ENCOUNTER — Telehealth: Payer: Self-pay | Admitting: *Deleted

## 2015-12-23 MED ORDER — EPICERAM EX EMUL: 1.0000 "application " | Freq: Two times a day (BID) | CUTANEOUS | Status: DC

## 2015-12-23 MED ORDER — ALEVICYN DERMAL SPRAY EX SOLN: 1.0000 "application " | Freq: Every day | CUTANEOUS | Status: DC

## 2015-12-23 NOTE — Telephone Encounter (Signed)
Left a message at the patient's pharmacy ([facility]) to not fill the Doxycycline per Dr Ardelle AntonWagoner. Misty StanleyLisa

## 2015-12-23 NOTE — Progress Notes (Signed)
Patient ID: Teresa GrinderDawn T Guzman, female   DOB: 12/19/1980, 35 y.o.   MRN: [id number]  Subjective: 35 year old female presents the office they for follow-up of dilation of bilateral foot lesions. She states that they're the same and she is completed the course of Lamisil. She is on numerous creams that she cannot identify Mrs. previous been biopsied by dermatology and she states that it was a allergic reaction, dermatitis. There is no evidence of psoriasis or eczema. No other complaints at this time.  Objective: General: AAO x3, NAD  Dermatological: The exam appears to be unchanged. On the plantar aspect of the foot bilaterally the right side worse than the left as a area of dry, scaly, erythematous skin mostly along submetatarsal one and into the first interspace on the right foot. Small areas in the arch the left foot. There appears to be no drainage today and there is no pus. There is no ascending cellulitis. There is no edema.  Vascular: Dorsalis Pedis artery and Posterior Tibial artery pedal pulses are 2/4 bilateral with immedate capillary fill time. Pedal hair growth present. No varicosities and no lower extremity edema present bilateral. There is no pain with calf compression, swelling, warmth, erythema.   Neruologic: Sensation intact with Dorann OuSimms Weinstein monofilament.  Musculoskeletal: No gross boney pedal deformities bilateral. No pain, crepitus, or limitation noted with foot and ankle range of motion bilateral. Muscular strength 5/5 in all groups tested bilateral.  Gait: Unassisted, Nonantalgic.   Assessment: 35 year old female with chronic dermatitis  Plan: -Treatment options discussed including all alternatives, risks, and complications -With the patient's permission I did talk to other colleagues in regards to her treatment as well as a podiatric dermatologist. At this time we will discontinue her other treatments and we will start EpiCeram and Alevicyn spray. Also will try to obtain her  previous biopsy -Follow up in 3 weeks  Ovid CurdMatthew Miles Borkowski, DPM

## 2015-12-24 ENCOUNTER — Telehealth: Payer: Self-pay | Admitting: *Deleted

## 2015-12-24 NOTE — Telephone Encounter (Addendum)
Left message for [facility] to fax copy of pt's last biopsy result. 12/27/2015-Pt states she is waiting to hear from Dr. Ardelle AntonWagoner concerning the consult with the other doctor and what she is to do.  12/28/2015-Called [phone], unable to leave a message voice mailbox is not set up.  I was unable to inform pt that her 2 medications were currently being pre-certed, but had been sent to her pharmacy.  01/05/2016-Pt called for status of medications pre-cert.  I informed pt the both medications had been denied and I would inform Dr. Ardelle AntonWagoner, and call with further instructions. Called pt's [employer] [place] [phone] for information concerning Appeal process, was instructed that a letter with instructions would be sent in 7-10 business days. 01/19/2016-No [employer] [place] letter received with instructions.  Faxed Dr. Gabriel RungWagoner's letter and pt's [employer] [place] card copy.

## 2015-12-28 NOTE — Telephone Encounter (Signed)
I sent her over the medicine to her pharmacy last week and I asked you to call her to let her know that I sent it over. Follow-up with me in 3-4 weeks

## 2015-12-29 ENCOUNTER — Ambulatory Visit
Admission: RE | Admit: 2015-12-29 | Discharge: 2015-12-29 | Disposition: A | Discharge status: Home / Self Care | Payer: BLUE CROSS/BLUE SHIELD | Source: Ambulatory Visit | Attending: Neurology | Admitting: Neurology

## 2015-12-29 DIAGNOSIS — I63111 Cerebral infarction due to embolism of right vertebral artery: Secondary | ICD-10-CM

## 2015-12-29 DIAGNOSIS — I7774 Dissection of vertebral artery: Secondary | ICD-10-CM

## 2015-12-29 DIAGNOSIS — I639 Cerebral infarction, unspecified: Secondary | ICD-10-CM | POA: Diagnosis not present

## 2015-12-29 MED ORDER — IOPAMIDOL (ISOVUE-370) INJECTION 76%
80.0000 mL | Freq: Once | INTRAVENOUS | Status: AC | PRN
  Administered 2015-12-29: 80 mL via INTRAVENOUS

## 2015-12-30 ENCOUNTER — Telehealth: Payer: Self-pay

## 2015-12-30 NOTE — Telephone Encounter (Signed)
Pt would like to know if is okay to return to the gym w/ no restrictions. Please advise.

## 2015-12-30 NOTE — Telephone Encounter (Signed)
Dissection has healed.

## 2015-12-30 NOTE — Telephone Encounter (Signed)
Pt is calling for CT Angio results from yesterday. Please advise.

## 2015-12-31 NOTE — Telephone Encounter (Signed)
Let pt know that Dr. Everlena CooperJaffe out of the office until Wednesday but I feel most comfortable with letting him answer this as he knows the patient and hx

## 2016-01-05 NOTE — Telephone Encounter (Signed)
Pt responded via my chart

## 2016-01-05 NOTE — Telephone Encounter (Signed)
Mychart message sent. Will call after 9 a.m. If no response from [employer] message.

## 2016-01-05 NOTE — Telephone Encounter (Signed)
She can resume exercise gradually.  She should try to avoid aggressive neck motion, stretching, turning and avoid trauma.

## 2016-01-09 ENCOUNTER — Encounter: Payer: Self-pay | Admitting: Podiatry

## 2016-01-09 NOTE — Telephone Encounter (Signed)
Letter on your desk. If not approved can you call the pharmacy and see if they have any alternatives?

## 2016-01-17 ENCOUNTER — Encounter: Payer: Self-pay | Admitting: Podiatry

## 2016-02-25 ENCOUNTER — Encounter: Payer: Self-pay | Admitting: Family Medicine

## 2016-03-08 ENCOUNTER — Other Ambulatory Visit: Payer: Self-pay | Admitting: General Practice

## 2016-03-08 MED ORDER — LEVOTHYROXINE SODIUM 100 MCG PO TABS: 100.0000 ug | ORAL_TABLET | Freq: Every day | ORAL | 6 refills | Status: DC

## 2016-03-14 ENCOUNTER — Ambulatory Visit: Payer: BLUE CROSS/BLUE SHIELD | Admitting: Family Medicine

## 2016-04-09 ENCOUNTER — Other Ambulatory Visit: Payer: Self-pay | Admitting: Neurology

## 2016-04-11 NOTE — Telephone Encounter (Signed)
2.  We will increase nortriptyline to 50mg  at bedtime.

## 2016-04-25 ENCOUNTER — Encounter: Payer: Self-pay | Admitting: Neurology

## 2016-05-03 ENCOUNTER — Encounter: Payer: Self-pay | Admitting: Neurology

## 2016-05-03 ENCOUNTER — Ambulatory Visit (INDEPENDENT_AMBULATORY_CARE_PROVIDER_SITE_OTHER): Payer: BLUE CROSS/BLUE SHIELD | Admitting: Neurology

## 2016-05-03 VITALS — BP 118/62 | HR 100 | Ht 60.0 in | Wt 126.0 lb

## 2016-05-03 DIAGNOSIS — I7774 Dissection of vertebral artery: Secondary | ICD-10-CM

## 2016-05-03 DIAGNOSIS — I63011 Cerebral infarction due to thrombosis of right vertebral artery: Secondary | ICD-10-CM

## 2016-05-03 MED ORDER — NORTRIPTYLINE HCL 25 MG PO CAPS
ORAL_CAPSULE | ORAL | 0 refills | Status: DC
Start: 2016-05-03 — End: 2016-07-24

## 2016-05-03 NOTE — Progress Notes (Signed)
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  However, it is difficult to really visualize the vessel since it is small.  CTA of neck from 12/29/15 was personally reviewed and revealed that the focal dissection had resolved.  She is doing well.  For headaches, she is taking nortriptyline 50mg  at bedtime.  She is headache-free.  She has started exercising again.   HISTORY: On the morning of 09/09/15, she woke up with a "crick" in her neck.  She drove to the gym and while in the parking lot, she felt unsteady and was leaning towards the right.  She participated in her cross-fit class.  Afterwards, she felt worse.  She was falling to the right and became plegic on the right side.  [facility].  MRI of brain revealed acute right cerebellar infarct.  MRA neck revealed right vertebral artery stenosis.  Follow up CTA of neck confirmed right vertebral artery focal dissection at C1-2.  There was segment irregularity in the right vertebral artery which raised suspicion of fibromuscular dysplasia.  Echo was normal.  She had nonspecific elevation of ANA.  Hgb A1c 5.7%.  LDL was 76.  She was started on Lipitor 10mg  daily.  She was started on ASA 325mg  and Plavix 75mg  daily.   Strength and balance has returned.  She feels a little dizzy at times, when she stands up or gets out of bed.  She continues to have a constant right sided neck and headache, for which she takes Tylenol. 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   Lights (such as from the computer screen) are triggers and has been out of  work.  She does have history of right sided migraines.  PAST MEDICAL HISTORY: Past Medical History:  Diagnosis Date  . Asthma   . Endometriosis   . Hypothyroidism   . Stroke (HCC) 09/09/15   had CVA per pt.    MEDICATIONS: Current Outpatient Prescriptions on File Prior to Visit  Medication Sig Dispense Refill  . Azelastine HCl 0.15 % SOLN use 2 sprays in each  nostril two times daily 90 mL 1  . cyclobenzaprine (FLEXERIL) 10 MG tablet Take 1 tablet (10 mg total) by mouth 3 (three) times daily as needed for muscle spasms. 30 tablet 3  . fluticasone (FLONASE) 50 MCG/ACT nasal spray USE 1 TO 2 SPRAYS INTO THE  NOSE DAILY 32 g 2  . levothyroxine (SYNTHROID, LEVOTHROID) 100 MCG tablet Take 1 tablet (100 mcg total) by mouth daily. 30 tablet 6  . loratadine-pseudoephedrine (CLARITIN-D 12-HOUR) 5-120 MG tablet Take 1 tablet by mouth 2 (two) times daily.    . montelukast (SINGULAIR) 10 MG tablet Take 1 tablet by mouth at  bedtime 90 tablet 1  . Multiple Vitamin (MULTIVITAMIN WITH MINERALS) TABS tablet Take 1 tablet by mouth daily. Reported on 09/29/2015     No current facility-administered medications on file prior to visit.     ALLERGIES: No Known Allergies  FAMILY HISTORY: Family History  Problem Relation Age of Onset  . Colon cancer Brother [id number]  . Hyperlipidemia Mother   .  Hypertension Father   . Hyperlipidemia Father   . Heart attack Father   . Diabetes Father     SOCIAL HISTORY: Social History   Social History  . Marital status: Married    Spouse name: N/A  . Number of children: N/A  . Years of education: N/A   Occupational History  . Not on file.   Social History Main Topics  . Smoking status: Never Smoker  . Smokeless tobacco: Never Used  . Alcohol use No     Comment: occ.  . Drug use: No  . Sexual activity: Not on file   Other Topics Concern  . Not on file   Social History Narrative  . No narrative on file    REVIEW OF SYSTEMS: Constitutional: No fevers,  chills, or sweats, no generalized fatigue, change in appetite Eyes: No visual changes, double vision, eye pain Ear, nose and throat: No hearing loss, ear pain, nasal congestion, sore throat Cardiovascular: No chest pain, palpitations Respiratory:  No shortness of breath at rest or with exertion, wheezes GastrointestinaI: No nausea, vomiting, diarrhea, abdominal pain, fecal incontinence Genitourinary:  No dysuria, urinary retention or frequency Musculoskeletal:  No neck pain, back pain Integumentary: No rash, pruritus, skin lesions Neurological: as above Psychiatric: No depression, insomnia, anxiety Endocrine: No palpitations, fatigue, diaphoresis, mood swings, change in appetite, change in weight, increased thirst Hematologic/Lymphatic:  No purpura, petechiae. Allergic/Immunologic: no itchy/runny eyes, nasal congestion, recent allergic reactions, rashes  PHYSICAL EXAM: Vitals:   05/03/16 1129  BP: 118/62  Pulse: 100   General: No acute distress.  Patient appears well-groomed.  normal body habitus. Head:  Normocephalic/atraumatic Eyes:  Fundi examined but not visualized Neck: supple, no paraspinal tenderness, full range of motion Heart:  Regular rate and rhythm Lungs:  Clear to auscultation bilaterally Back: No paraspinal tenderness Neurological Exam: alert and oriented to person, place, and time. Attention span and concentration intact, recent and remote memory intact, fund of knowledge intact.  Speech fluent and not dysarthric, language intact.  CN II-XII intact. Bulk and tone normal, muscle strength 5/5 throughout.  Sensation to light touch  intact.  Deep tendon reflexes 2+ throughout.  Finger to nose testing intact.  Gait normal, Romberg negative.  IMPRESSION: Right cerebellar infarct, thrombotic secondary to right vertebral dissection, probably traumatic related to vigorous exercise   Right sided headache, secondary to above, resolved.    PLAN: 1.  May stop Plavix.  I would  recommend ASA 81mg  daily for secondary stroke prevention. 2.  Decrease nortriptyline to 25mg  at bedtime for 1 week, then stop.  If headaches should recur, she will contact me. 3.  Otherwise, follow up as needed.  15 minutes spent face to face with patient, over 50% spent counseling  Shon Millet, DO  CC:  [name], MD

## 2016-05-03 NOTE — Patient Instructions (Signed)
1.  I would remain on aspirin 81mg  daily.  You may remain off of Plavix 2.  Take nortriptyline 25mg  at bedtime for 7 days, then stop 3.  No follow up unless needed.  Contact me with any concerns.

## 2016-05-07 ENCOUNTER — Other Ambulatory Visit: Payer: Self-pay | Admitting: Family Medicine

## 2016-07-24 ENCOUNTER — Encounter: Payer: Self-pay | Admitting: Family Medicine

## 2016-07-24 ENCOUNTER — Ambulatory Visit (INDEPENDENT_AMBULATORY_CARE_PROVIDER_SITE_OTHER): Payer: BLUE CROSS/BLUE SHIELD | Admitting: Family Medicine

## 2016-07-24 VITALS — BP 119/70 | HR 90 | Temp 98.0°F | Resp 16 | Ht 60.0 in | Wt 133.4 lb

## 2016-07-24 DIAGNOSIS — Z0000 Encounter for general adult medical examination without abnormal findings: Secondary | ICD-10-CM | POA: Diagnosis not present

## 2016-07-24 LAB — HEPATIC FUNCTION PANEL
ALT: 13 U/L (ref 0–35)
AST: 22 U/L (ref 0–37)
Albumin: 4.2 g/dL (ref 3.5–5.2)
Alkaline Phosphatase: 79 U/L (ref 39–117)
Bilirubin, Direct: 0 mg/dL (ref 0.0–0.3)
Total Bilirubin: 0.2 mg/dL (ref 0.2–1.2)
Total Protein: 6.8 g/dL (ref 6.0–8.3)

## 2016-07-24 LAB — CBC WITH DIFFERENTIAL/PLATELET
Basophils Absolute: 0 10*3/uL (ref 0.0–0.1)
Basophils Relative: 0.6 % (ref 0.0–3.0)
Eosinophils Absolute: 0 10*3/uL (ref 0.0–0.7)
Eosinophils Relative: 0.9 % (ref 0.0–5.0)
HCT: 30.6 % — ABNORMAL LOW (ref 36.0–46.0)
Hemoglobin: 9.9 g/dL — ABNORMAL LOW (ref 12.0–15.0)
Lymphocytes Relative: 32.7 % (ref 12.0–46.0)
Lymphs Abs: 1.6 10*3/uL (ref 0.7–4.0)
MCHC: 32.4 g/dL (ref 30.0–36.0)
MCV: 78.5 fl (ref 78.0–100.0)
Monocytes Absolute: 0.4 10*3/uL (ref 0.1–1.0)
Monocytes Relative: 8 % (ref 3.0–12.0)
Neutro Abs: 2.9 10*3/uL (ref 1.4–7.7)
Neutrophils Relative %: 57.8 % (ref 43.0–77.0)
Platelets: 312 10*3/uL (ref 150.0–400.0)
RBC: 3.9 Mil/uL (ref 3.87–5.11)
RDW: 17.4 % — ABNORMAL HIGH (ref 11.5–15.5)
WBC: 5 10*3/uL (ref 4.0–10.5)

## 2016-07-24 LAB — LIPID PANEL
Cholesterol: 170 mg/dL (ref 0–200)
HDL: 58.6 mg/dL (ref >39.00)
LDL Cholesterol: 93 mg/dL (ref 0–99)
NonHDL: 111.89
Total CHOL/HDL Ratio: 3
Triglycerides: 96 mg/dL (ref 0.0–149.0)
VLDL: 19.2 mg/dL (ref 0.0–40.0)

## 2016-07-24 LAB — BASIC METABOLIC PANEL
BUN: 15 mg/dL (ref 6–23)
CO2: 29 mEq/L (ref 19–32)
Calcium: 9 mg/dL (ref 8.4–10.5)
Chloride: 103 mEq/L (ref 96–112)
Creatinine, Ser: 0.63 mg/dL (ref 0.40–1.20)
GFR: 112.94 mL/min (ref >60.00)
Glucose, Bld: 58 mg/dL — ABNORMAL LOW (ref 70–99)
Potassium: 4.4 mEq/L (ref 3.5–5.1)
Sodium: 139 mEq/L (ref 135–145)

## 2016-07-24 LAB — VITAMIN D 25 HYDROXY (VIT D DEFICIENCY, FRACTURES): VITD: 18.76 ng/mL — ABNORMAL LOW (ref 30.00–100.00)

## 2016-07-24 LAB — TSH: TSH: 1.61 u[IU]/mL (ref 0.35–4.50)

## 2016-07-24 NOTE — Progress Notes (Signed)
   Subjective:    Patient ID: Teresa GrinderDawn T Oelkers, female    DOB: 01/07/1981, 35 y.o.   MRN: [id number]  HPI CPE- UTD on GYN.  No concerns today.  Due for colonoscopy in Spring.   Review of Systems Patient reports no vision/ hearing changes, adenopathy,fever, weight change,  persistant/recurrent hoarseness , swallowing issues, chest pain, palpitations, edema, persistant/recurrent cough, hemoptysis, dyspnea (rest/exertional/paroxysmal nocturnal), gastrointestinal bleeding (melena, rectal bleeding), abdominal pain, significant heartburn, bowel changes, GU symptoms (dysuria, hematuria, incontinence), Gyn symptoms (abnormal  bleeding, pain),  syncope, focal weakness, memory loss, numbness & tingling, skin/hair/nail changes, abnormal bruising or bleeding, anxiety, or depression.     Objective:   Physical Exam General Appearance:    Alert, cooperative, no distress, appears stated age  Head:    Normocephalic, without obvious abnormality, atraumatic  Eyes:    PERRL, conjunctiva/corneas clear, EOM's intact, fundi    benign, both eyes  Ears:    Normal TM's and external ear canals, both ears  Nose:   Nares normal, septum midline, mucosa normal, no drainage    or sinus tenderness  Throat:   Lips, mucosa, and tongue normal; teeth and gums normal  Neck:   Supple, symmetrical, trachea midline, no adenopathy;    Thyroid: no enlargement/tenderness/nodules  Back:     Symmetric, no curvature, ROM normal, no CVA tenderness  Lungs:     Clear to auscultation bilaterally, respirations unlabored  Chest Wall:    No tenderness or deformity   Heart:    Regular rate and rhythm, S1 and S2 normal, no murmur, rub   or gallop  Breast Exam:    Normal breast exam  Abdomen:     Soft, non-tender, bowel sounds active all four quadrants,    no masses, no organomegaly  Genitalia:    Deferred to GYN  Rectal:    Extremities:   Extremities normal, atraumatic, no cyanosis or edema  Pulses:   2+ and symmetric all extremities  Skin:    Skin color, texture, turgor normal, no rashes or lesions  Lymph nodes:   Cervical, supraclavicular, and axillary nodes normal  Neurologic:   CNII-XII intact, normal strength, sensation and reflexes    throughout          Assessment & Plan:

## 2016-07-24 NOTE — Progress Notes (Signed)
Pre visit review using our clinic review tool, if applicable. No additional management support is needed unless otherwise documented below in the visit note. 

## 2016-07-24 NOTE — Patient Instructions (Signed)
Follow up in 1 year or as needed We'll notify you of your lab results and make any changes if needed Keep up the good work on healthy diet and regular exercise- you look great! Call with any questions or concerns Happy Holidays!!! 

## 2016-07-24 NOTE — Assessment & Plan Note (Signed)
Pt's PE WNL.  UTD on pap, due for colonoscopy this spring.  Breast exam WNL.  Declines flu today.  Check labs.  Anticipatory guidance provided.

## 2016-07-25 ENCOUNTER — Other Ambulatory Visit: Payer: Self-pay | Admitting: General Practice

## 2016-07-25 DIAGNOSIS — D649 Anemia, unspecified: Secondary | ICD-10-CM

## 2016-07-25 MED ORDER — VITAMIN D (ERGOCALCIFEROL) 1.25 MG (50000 UNIT) PO CAPS: 50000.0000 [IU] | ORAL_CAPSULE | ORAL | 0 refills | Status: DC

## 2016-08-03 ENCOUNTER — Other Ambulatory Visit: Payer: Self-pay | Admitting: Family Medicine

## 2016-09-15 ENCOUNTER — Other Ambulatory Visit (INDEPENDENT_AMBULATORY_CARE_PROVIDER_SITE_OTHER): Payer: BLUE CROSS/BLUE SHIELD

## 2016-09-15 DIAGNOSIS — D649 Anemia, unspecified: Secondary | ICD-10-CM | POA: Diagnosis not present

## 2016-09-15 DIAGNOSIS — K921 Melena: Secondary | ICD-10-CM | POA: Diagnosis not present

## 2016-09-15 LAB — FECAL OCCULT BLOOD, IMMUNOCHEMICAL: Fecal Occult Bld: NEGATIVE

## 2016-10-10 ENCOUNTER — Other Ambulatory Visit: Payer: Self-pay | Admitting: Family Medicine

## 2016-10-22 ENCOUNTER — Other Ambulatory Visit: Payer: Self-pay | Admitting: Family Medicine

## 2016-10-23 DIAGNOSIS — M9900 Segmental and somatic dysfunction of head region: Secondary | ICD-10-CM | POA: Diagnosis not present

## 2016-10-23 DIAGNOSIS — M9901 Segmental and somatic dysfunction of cervical region: Secondary | ICD-10-CM | POA: Diagnosis not present

## 2016-10-23 DIAGNOSIS — G43101 Migraine with aura, not intractable, with status migrainosus: Secondary | ICD-10-CM | POA: Diagnosis not present

## 2016-10-23 DIAGNOSIS — M9902 Segmental and somatic dysfunction of thoracic region: Secondary | ICD-10-CM | POA: Diagnosis not present

## 2016-11-02 ENCOUNTER — Other Ambulatory Visit: Payer: Self-pay | Admitting: Family Medicine

## 2016-12-25 ENCOUNTER — Encounter: Payer: Self-pay | Admitting: Neurology

## 2016-12-27 ENCOUNTER — Encounter: Payer: Self-pay | Admitting: Neurology

## 2016-12-28 DIAGNOSIS — G43101 Migraine with aura, not intractable, with status migrainosus: Secondary | ICD-10-CM | POA: Diagnosis not present

## 2016-12-28 DIAGNOSIS — M9900 Segmental and somatic dysfunction of head region: Secondary | ICD-10-CM | POA: Diagnosis not present

## 2016-12-28 DIAGNOSIS — M9902 Segmental and somatic dysfunction of thoracic region: Secondary | ICD-10-CM | POA: Diagnosis not present

## 2016-12-28 DIAGNOSIS — M9901 Segmental and somatic dysfunction of cervical region: Secondary | ICD-10-CM | POA: Diagnosis not present

## 2017-01-05 ENCOUNTER — Encounter: Payer: Self-pay | Admitting: Family Medicine

## 2017-01-05 DIAGNOSIS — M25519 Pain in unspecified shoulder: Secondary | ICD-10-CM

## 2017-01-11 ENCOUNTER — Encounter: Payer: Self-pay | Admitting: Neurology

## 2017-01-11 ENCOUNTER — Ambulatory Visit (INDEPENDENT_AMBULATORY_CARE_PROVIDER_SITE_OTHER): Payer: BLUE CROSS/BLUE SHIELD | Admitting: Neurology

## 2017-01-11 ENCOUNTER — Other Ambulatory Visit: Payer: Self-pay

## 2017-01-11 VITALS — BP 100/62 | HR 80 | Ht 60.0 in | Wt 138.0 lb

## 2017-01-11 DIAGNOSIS — M5412 Radiculopathy, cervical region: Secondary | ICD-10-CM

## 2017-01-11 DIAGNOSIS — I7774 Dissection of vertebral artery: Secondary | ICD-10-CM

## 2017-01-11 DIAGNOSIS — I63111 Cerebral infarction due to embolism of right vertebral artery: Secondary | ICD-10-CM | POA: Diagnosis not present

## 2017-01-11 DIAGNOSIS — G43829 Menstrual migraine, not intractable, without status migrainosus: Secondary | ICD-10-CM | POA: Diagnosis not present

## 2017-01-11 MED ORDER — NAPROXEN 500 MG PO TABS: ORAL_TABLET | ORAL | 2 refills | Status: DC

## 2017-01-11 MED ORDER — GABAPENTIN 300 MG PO CAPS: 300.0000 mg | ORAL_CAPSULE | Freq: Every day | ORAL | 3 refills | Status: DC

## 2017-01-11 NOTE — Patient Instructions (Addendum)
1.  We will check MRI of cervical spine to look for pinched nerve 2.  Start gabapentin 300mg  at bedtime to help with headache and nerve pain.  We can increase dose as needed. 3.  To help prevent menstrual migraine, take naproxen 500mg  every 12 hours beginning one week prior to onset of your period and take for 2 straight weeks. 4..  Consider supplements for migraine prevention:  Magnesium citrate 400mg  to 600mg  daily, riboflavin 400mg , Coenzyme Q 10 100mg  three times daily 5.  Take the aspirin 81mg  daily

## 2017-01-11 NOTE — Progress Notes (Signed)
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   UPDATE: About 4 weeks ago, she started experiencing left posterior neck pain which radiates as a throbbing mild to moderate pain down the lateral aspect of her left arm up to just below the elbow.  For the first two days, she noted numbness and tingling in the last 2 fingers of her left hand, but that has since resolved.  She denies any weakness, ataxia or visual disturbance.  The pain is constant.  Looking up or down, or turning her head to the right exacerbates the pain.  Holding her elbow with her arm crossed helps relieve the throbbing arm pain.  She stopped exercising at that time.  She tried muscle relaxants, stretches, heat, ice, acupuncture and has seen a chiropractor (without high-velocity, low amplitude techniques or manipulation), which have been ineffective.  It has not gotten worse but has not improved.  For the past 5 to 6 months, she reports recurrence of her typical migraine (separate than the headaches she experienced following her stroke.  It is right frontal, throbbing, 8/10, and associated with nausea, vomiting, photophobia and phonophobia.  It lasts almost daily for 2 weeks beginning the week of her period.  She has not been taking her ASA.   HISTORY: In February 2017, she sustained a right cerevellar stroke secondary to right vertebral dissection.  She was falling to the right and became plegic on the right side.  [facility].  MRI of brain revealed acute right cerebellar infarct.  MRA neck revealed right vertebral artery stenosis.  Follow up CTA of neck confirmed right vertebral artery focal dissection at C1-2.  There was segment irregularity in the  right vertebral artery which raised suspicion of fibromuscular dysplasia.  Echo was normal.  She had nonspecific elevation of ANA.  Hgb A1c 5.7%.  LDL was 76.  She was started on Lipitor 10mg  daily.  She was started on ASA 325mg  and Plavix 75mg  daily.  Repeat MRA of the neck from 11/25/15 appears unchanged, revealing right vertebral dissection but vessel is patent and without pseudoaneurysm.  However, it is difficult to really visualize the vessel since it is small.  CTA of neck from 12/29/15 was personally reviewed and revealed that the focal dissection had resolved.  Lipitor and Plavix were subsequently discontinued and she was continued on ASA.   Following the stroke, she was experiencing right sided headache.  She was taking nortriptyline 50mg .  Headaches subsequently resolved and she was tapered off of the nortriptyline in September.  PAST MEDICAL HISTORY: Past Medical History:  Diagnosis Date  . Asthma   . Endometriosis   . Hypothyroidism   . Stroke (HCC) 09/09/15   had CVA per pt.    MEDICATIONS: Current Outpatient Prescriptions on File Prior to Visit  Medication Sig Dispense Refill  . Azelastine HCl 0.15 % SOLN use 2 sprays in each  nostril two times daily 90 mL 1  . fluticasone (FLONASE) 50 MCG/ACT nasal spray USE 1 TO 2 SPRAYS INTO THE  NOSE DAILY 32 g 2  . levothyroxine (SYNTHROID, LEVOTHROID) 100 MCG tablet TAKE 1 TABLET(100 MCG) BY MOUTH DAILY 30 tablet 6  . loratadine-pseudoephedrine (CLARITIN-D 12-HOUR) 5-120 MG tablet Take 1 tablet by  mouth 2 (two) times daily.    . montelukast (SINGULAIR) 10 MG tablet TAKE 1 TABLET BY MOUTH AT BEDTIME 90 tablet 1  . Multiple Vitamin (MULTIVITAMIN WITH MINERALS) TABS tablet Take 1 tablet by mouth daily. Reported on 09/29/2015    . Vitamin D, Ergocalciferol, (DRISDOL) 50000 units CAPS capsule Take 1 capsule (50,000 Units total) by mouth every 7 (seven) days. 12 capsule 0   No current facility-administered medications on file prior to visit.      ALLERGIES: No Known Allergies  FAMILY HISTORY: Family History  Problem Relation Age of Onset  . Colon cancer Brother 39  . Hyperlipidemia Mother   . Hypertension Father   . Hyperlipidemia Father   . Heart attack Father   . Diabetes Father     SOCIAL HISTORY: Social History   Social History  . Marital status: Married    Spouse name: N/A  . Number of children: N/A  . Years of education: N/A   Occupational History  . Not on file.   Social History Main Topics  . Smoking status: Never Smoker  . Smokeless tobacco: Never Used  . Alcohol use No     Comment: occ.  . Drug use: No  . Sexual activity: Not on file   Other Topics Concern  . Not on file   Social History Narrative  . No narrative on file    REVIEW OF SYSTEMS: Constitutional: No fevers, chills, or sweats, no generalized fatigue, change in appetite Eyes: No visual changes, double vision, eye pain Ear, nose and throat: No hearing loss, ear pain, nasal congestion, sore throat Cardiovascular: No chest pain, palpitations Respiratory:  No shortness of breath at rest or with exertion, wheezes GastrointestinaI: No nausea, vomiting, diarrhea, abdominal pain, fecal incontinence Genitourinary:  No dysuria, urinary retention or frequency Musculoskeletal:  Left sided neck pain Integumentary: No rash, pruritus, skin lesions Neurological: as above Psychiatric: No depression, insomnia, anxiety Endocrine: No palpitations, fatigue, diaphoresis, mood swings, change in appetite, change in weight, increased thirst Hematologic/Lymphatic:  No purpura, petechiae. Allergic/Immunologic: no itchy/runny eyes, nasal congestion, recent allergic reactions, rashes  PHYSICAL EXAM: Vitals:   01/11/17 0736  BP: 100/62  Pulse: 80   General: No acute distress.  Patient appears well-groomed.  normal body habitus. Head:  Normocephalic/atraumatic Eyes:  Fundi examined but not visualized Neck: supple, tenderness to palpation to left  side of posterior neck, no paraspinal tenderness, full range of motion Heart:  Regular rate and rhythm Lungs:  Clear to auscultation bilaterally Back: No paraspinal tenderness Neurological Exam: alert and oriented to person, place, and time. Attention span and concentration intact, recent and remote memory intact, fund of knowledge intact.  Speech fluent and not dysarthric, language intact.  CN II-XII intact. Bulk and tone normal, muscle strength 5/5 throughout.  Sensation to light touch, temperature and vibration intact.  Deep tendon reflexes 2+ throughout, toes downgoing.  Finger to nose and heel to shin testing intact.  Gait normal, Romberg negative.  IMPRESSION: 1.  Left sided neck pain radiating into the left arm.  Semiology consistent with cervical radiculopathy.  She does not exhibit any focal/lateralizing symptoms to suspect stroke or new dissection. 2.  Menstrual migraines 3.  History of right cerebellar infarct secondary to right vertebral artery dissection.  PLAN: 1.  She has failed conservative management including physical therapy and medication).  I would like to get MRI of cervical spine to assess for focal/structural etiology of symptoms. 2.  We will start gabapentin 300mg  at bedtime to  treat both radicular pain and migraine.  We can titrate dose as needed/tolerated.  I also recommended magnesium citrate, riboflavin and CoQ10 for migraine prevention. 3.  For menstrual migraine, I will prescribe a perimenstrual prophylaxis (naproxen 500mg  twice daily for two weeks, starting one week prior to onset of period) 4.  Advised that she should restart ASA 81mg  daily. 5.  Follow up in 4 months.  25 minutes spent face to face with patient, over 50% spent discussing diagnosis and management.  Shon Millet, DO  CC:  [name], MD

## 2017-01-16 ENCOUNTER — Telehealth: Payer: Self-pay | Admitting: Neurology

## 2017-01-16 NOTE — Telephone Encounter (Signed)
Called [facility] and they will call patient today to set up appt.

## 2017-01-16 NOTE — Telephone Encounter (Signed)
PT called and said she has not been scheduled yet for her CT scan, she was in last week and told someone would get in contact with her

## 2017-01-17 ENCOUNTER — Encounter: Payer: Self-pay | Admitting: Neurology

## 2017-01-19 ENCOUNTER — Encounter: Payer: Self-pay | Admitting: Family Medicine

## 2017-01-24 ENCOUNTER — Other Ambulatory Visit: Payer: BLUE CROSS/BLUE SHIELD

## 2017-01-26 ENCOUNTER — Ambulatory Visit
Admission: RE | Admit: 2017-01-26 | Discharge: 2017-01-26 | Disposition: A | Discharge status: Home / Self Care | Payer: BLUE CROSS/BLUE SHIELD | Source: Ambulatory Visit | Attending: Neurology | Admitting: Neurology

## 2017-01-26 DIAGNOSIS — M5412 Radiculopathy, cervical region: Secondary | ICD-10-CM

## 2017-01-26 DIAGNOSIS — I7774 Dissection of vertebral artery: Secondary | ICD-10-CM

## 2017-01-26 DIAGNOSIS — M542 Cervicalgia: Secondary | ICD-10-CM | POA: Diagnosis not present

## 2017-01-29 ENCOUNTER — Encounter: Payer: Self-pay | Admitting: Neurology

## 2017-02-06 ENCOUNTER — Encounter: Payer: Self-pay | Admitting: Family Medicine

## 2017-02-06 ENCOUNTER — Encounter: Payer: Self-pay | Admitting: Neurology

## 2017-02-06 DIAGNOSIS — M542 Cervicalgia: Secondary | ICD-10-CM | POA: Diagnosis not present

## 2017-02-06 NOTE — Telephone Encounter (Signed)
Please advise, paperwork (surgical clearance) printed and placed in your basket for signature. Pt seen on 07/24/16 for CPE does she need to come back in before surgery?

## 2017-02-09 ENCOUNTER — Other Ambulatory Visit: Payer: Self-pay | Admitting: Family Medicine

## 2017-02-12 ENCOUNTER — Ambulatory Visit: Payer: Self-pay | Admitting: Physician Assistant

## 2017-02-15 ENCOUNTER — Encounter: Payer: Self-pay | Admitting: Family Medicine

## 2017-02-15 ENCOUNTER — Ambulatory Visit (INDEPENDENT_AMBULATORY_CARE_PROVIDER_SITE_OTHER): Payer: BLUE CROSS/BLUE SHIELD | Admitting: Family Medicine

## 2017-02-15 VITALS — BP 102/68 | HR 62 | Temp 98.6°F | Resp 16 | Ht 60.0 in | Wt 141.0 lb

## 2017-02-15 DIAGNOSIS — Z01818 Encounter for other preprocedural examination: Secondary | ICD-10-CM | POA: Diagnosis not present

## 2017-02-15 DIAGNOSIS — E559 Vitamin D deficiency, unspecified: Secondary | ICD-10-CM | POA: Diagnosis not present

## 2017-02-15 LAB — CBC WITH DIFFERENTIAL/PLATELET
Basophils Absolute: 73 cells/uL (ref 0–200)
Basophils Relative: 1 %
Eosinophils Absolute: 146 cells/uL (ref 15–500)
Eosinophils Relative: 2 %
HCT: 40 % (ref 35.0–45.0)
Hemoglobin: 12.7 g/dL (ref 11.7–15.5)
Lymphocytes Relative: 36 %
Lymphs Abs: 2628 cells/uL (ref 850–3900)
MCH: 29 pg (ref 27.0–33.0)
MCHC: 31.8 g/dL — ABNORMAL LOW (ref 32.0–36.0)
MCV: 91.3 fL (ref 80.0–100.0)
MPV: 9.8 fL (ref 7.5–12.5)
Monocytes Absolute: 584 cells/uL (ref 200–950)
Monocytes Relative: 8 %
Neutro Abs: 3869 cells/uL (ref 1500–7800)
Neutrophils Relative %: 53 %
Platelets: 311 10*3/uL (ref 140–400)
RBC: 4.38 MIL/uL (ref 3.80–5.10)
RDW: 17.2 % — ABNORMAL HIGH (ref 11.0–15.0)
WBC: 7.3 10*3/uL (ref 3.8–10.8)

## 2017-02-15 NOTE — Patient Instructions (Signed)
Follow up as needed/scheduled We'll notify you of your lab results and make any changes if needed Keep up the good work on healthy diet and regular exercise- you look great! Call with any questions or concerns ENJOY VACATION!!!

## 2017-02-15 NOTE — Progress Notes (Signed)
Pre visit review using our clinic review tool, if applicable. No additional management support is needed unless otherwise documented below in the visit note. 

## 2017-02-15 NOTE — Progress Notes (Signed)
Subjective:    Teresa Guzman is a 36 y.o. female who presents to the office today for a preoperative consultation at the request of surgeon Dr Shon Baton who plans on performing ACDF C5-6 on TBD  . This consultation is requested for the specific conditions prompting preoperative evaluation (i.e. because of potential affect on operative risk): hx of CVA. Planned anesthesia: general. The patient has the following known anesthesia issues: none. Patients bleeding risk: no recent abnormal bleeding and no remote history of abnormal bleeding. Patient does not have objections to receiving blood products if needed.  The following portions of the patient's history were reviewed and updated as appropriate: allergies, current medications, past family history, past medical history, past social history, past surgical history and problem list.  Review of Systems A comprehensive review of systems was negative.    Objective:    BP 102/68   Pulse 62   Temp 98.6 F (37 C) (Oral)   Resp 16   Ht 5' (1.524 m)   Wt 141 lb (64 kg)   SpO2 98%   BMI 27.54 kg/m   General Appearance:    Alert, cooperative, no distress, appears stated age  Head:    Normocephalic, without obvious abnormality, atraumatic  Eyes:    PERRL, conjunctiva/corneas clear, EOM's intact, fundi    benign, both eyes  Ears:    Normal TM's and external ear canals, both ears  Nose:   Nares normal, septum midline, mucosa normal, no drainage    or sinus tenderness  Throat:   Lips, mucosa, and tongue normal; teeth and gums normal  Neck:   Supple, symmetrical, trachea midline, no adenopathy;    thyroid:  no enlargement/tenderness/nodules  Back:     Symmetric, no curvature, ROM normal, no CVA tenderness  Lungs:     Clear to auscultation bilaterally, respirations unlabored  Chest Wall:    No tenderness or deformity   Heart:    Regular rate and rhythm, S1 and S2 normal, no murmur, rub   or gallop  Breast Exam:    No tenderness, masses, or nipple  abnormality  Abdomen:     Soft, non-tender, bowel sounds active all four quadrants,    no masses, no organomegaly  Genitalia:    deferred  Rectal:    Extremities:   Extremities normal, atraumatic, no cyanosis or edema  Pulses:   2+ and symmetric all extremities  Skin:   Skin color, texture, turgor normal, no rashes or lesions  Lymph nodes:   Cervical, supraclavicular, and axillary nodes normal  Neurologic:   CNII-XII intact, normal strength, sensation and reflexes    throughout    Predictors of intubation difficulty:  Morbid obesity? no  Anatomically abnormal facies? no  Prominent incisors? no  Receding mandible? no  Short, thick neck? no  Neck range of motion: normal  Dentition: No chipped, loose, or missing teeth.  Cardiographics ECG: normal sinus rhythm, no blocks or conduction defects, no ischemic changes Echocardiogram: not done  Imaging Chest x-ray: NA   Lab Review  Appointment on 09/15/2016  Component Date Value  . Fecal Occult Bld 09/15/2016 Negative       Assessment:      36 y.o. female with planned surgery as above.   Known risk factors for perioperative complications: None   Difficulty with intubation is not anticipated.  Cardiac Risk Estimation: low  Current medications which may produce withdrawal symptoms if withheld perioperatively: none     Plan:    1. Preoperative workup  as follows ECG. 2. Change in medication regimen before surgery: discontinue NSAIDs (Naproxen) 14 days before surgery. 3. Prophylaxis for cardiac events with perioperative beta-blockers: should be considered, specific regimen per anesthesia. 4. Invasive hemodynamic monitoring perioperatively: at the discretion of anesthesiologist. 5. Deep vein thrombosis prophylaxis postoperatively:regimen to be chosen by surgical team. 6. Surveillance for postoperative MI with ECG immediately postoperatively and on postoperative days 1 and 2 AND troponin levels 24 hours postoperatively and on day 4  or hospital discharge (whichever comes first): at the discretion of anesthesiologist. 7. Other measures: None

## 2017-02-16 LAB — VITAMIN D 25 HYDROXY (VIT D DEFICIENCY, FRACTURES): Vit D, 25-Hydroxy: 53 ng/mL (ref 30–100)

## 2017-03-05 DIAGNOSIS — M542 Cervicalgia: Secondary | ICD-10-CM | POA: Diagnosis not present

## 2017-03-05 NOTE — Pre-Procedure Instructions (Signed)
Ceriah T Stumpp  03/05/2017      [facility] [phone] - [place] - [address] [place] [id number] Phone: [phone] Fax: [phone]  [facility] [id number] - [place] - [address] AT [facility] [facility] [place] [id number] Phone: [phone] Fax: [phone]  [facility] [id number] - [place], [place] - [address] AT [facility] [address] [place] [id number] Phone: [phone] Fax: [phone]    Your procedure is scheduled on   Thursday  03/08/17  Report to [facility] Admitting at 530 A.M.  Call this number if you have problems the morning of surgery:  [phone]   Remember:  Do not eat food or drink liquids after midnight.  Take these medicines the morning of surgery with A SIP OF WATER    LEVOTHYROXINE    7 days prior to surgery STOP taking any Aspirin, Aleve, Naproxen, Ibuprofen, Motrin, Advil, Goody's, BC's, all herbal medications, fish oil, and all vitamins   Do not wear jewelry, make-up or nail polish.  Do not wear lotions, powders, or perfumes, or deoderant.  Do not shave 48 hours prior to surgery.  Men may shave face and neck.  Do not bring valuables to the hospital.  [facility] is not responsible for any belongings or valuables.  Contacts, dentures or bridgework may not be worn into surgery.  Leave your suitcase in the car.  After surgery it may be brought to your room.  For patients admitted to the hospital, discharge time will be determined by your treatment team.  Patients discharged the day of surgery will not be allowed to drive home.   Name and phone number of your driver:    Special instructions:  [facility] - Preparing for Surgery  Before surgery, you can play an important role.  Because skin is not sterile, your skin needs to be as free of germs as possible.  You can reduce the  number of germs on you skin by washing with CHG (chlorahexidine gluconate) soap before surgery.  CHG is an antiseptic cleaner which kills germs and bonds with the skin to continue killing germs even after washing.  Please DO NOT use if you have an allergy to CHG or antibacterial soaps.  If your skin becomes reddened/irritated stop using the CHG and inform your nurse when you arrive at Short Stay.  Do not shave (including legs and underarms) for at least 48 hours prior to the first CHG shower.  You may shave your face.  Please follow these instructions carefully:   1.  Shower with CHG Soap the night before surgery and the                                morning of Surgery.  2.  If you choose to wash your hair, wash your hair first as usual with your       normal shampoo.  3.  After you shampoo, rinse your hair and body thoroughly to remove the                      Shampoo.  4.  Use CHG as you would any other liquid soap.  You can apply chg directly  to the skin and wash gently with scrungie or a clean washcloth.  5.  Apply the CHG Soap to your body ONLY FROM THE NECK DOWN.        Do not use on open wounds or open sores.  Avoid contact with your eyes,       ears, mouth and genitals (private parts).  Wash genitals (private parts)       with your normal soap.  6.  Wash thoroughly, paying special attention to the area where your surgery        will be performed.  7.  Thoroughly rinse your body with warm water from the neck down.  8.  DO NOT shower/wash with your normal soap after using and rinsing off       the CHG Soap.  9.  Pat yourself dry with a clean towel.            10.  Wear clean pajamas.            11.  Place clean sheets on your bed the night of your first shower and do not        sleep with pets.  Day of Surgery  Do not apply any lotions/deoderants the morning of surgery.  Please wear clean clothes to the hospital/surgery center.    Please read over the following fact sheets that  you were given. Pain Booklet, MRSA Information and Surgical Site Infection Prevention

## 2017-03-06 ENCOUNTER — Encounter (HOSPITAL_COMMUNITY)
Admission: RE | Admit: 2017-03-06 | Discharge: 2017-03-06 | Disposition: A | Discharge status: Home / Self Care | Payer: BLUE CROSS/BLUE SHIELD | Source: Ambulatory Visit | Attending: Orthopedic Surgery | Admitting: Orthopedic Surgery

## 2017-03-06 ENCOUNTER — Encounter (HOSPITAL_COMMUNITY): Payer: Self-pay

## 2017-03-06 DIAGNOSIS — M50222 Other cervical disc displacement at C5-C6 level: Secondary | ICD-10-CM | POA: Insufficient documentation

## 2017-03-06 DIAGNOSIS — M5412 Radiculopathy, cervical region: Secondary | ICD-10-CM | POA: Diagnosis not present

## 2017-03-06 DIAGNOSIS — M4802 Spinal stenosis, cervical region: Secondary | ICD-10-CM | POA: Diagnosis not present

## 2017-03-06 DIAGNOSIS — E039 Hypothyroidism, unspecified: Secondary | ICD-10-CM | POA: Diagnosis not present

## 2017-03-06 DIAGNOSIS — Z8673 Personal history of transient ischemic attack (TIA), and cerebral infarction without residual deficits: Secondary | ICD-10-CM | POA: Diagnosis not present

## 2017-03-06 HISTORY — DX: Headache: R51

## 2017-03-06 HISTORY — DX: Anemia, unspecified: D64.9

## 2017-03-06 HISTORY — DX: Headache, unspecified: R51.9

## 2017-03-06 LAB — CBC
HCT: 39.4 % (ref 36.0–46.0)
Hemoglobin: 13.1 g/dL (ref 12.0–15.0)
MCH: 29.1 pg (ref 26.0–34.0)
MCHC: 33.2 g/dL (ref 30.0–36.0)
MCV: 87.6 fL (ref 78.0–100.0)
Platelets: 248 10*3/uL (ref 150–400)
RBC: 4.5 MIL/uL (ref 3.87–5.11)
RDW: 14.8 % (ref 11.5–15.5)
WBC: 6.4 10*3/uL (ref 4.0–10.5)

## 2017-03-06 LAB — BASIC METABOLIC PANEL
Anion gap: 7 (ref 5–15)
BUN: 11 mg/dL (ref 6–20)
CO2: 27 mmol/L (ref 22–32)
Calcium: 9.5 mg/dL (ref 8.9–10.3)
Chloride: 104 mmol/L (ref 101–111)
Creatinine, Ser: 0.74 mg/dL (ref 0.44–1.00)
GFR calc Af Amer: >60 mL/min (ref >60)
GFR calc non Af Amer: >60 mL/min (ref >60)
Glucose, Bld: 92 mg/dL (ref 65–99)
Potassium: 4.4 mmol/L (ref 3.5–5.1)
Sodium: 138 mmol/L (ref 135–145)

## 2017-03-06 LAB — HCG, SERUM, QUALITATIVE: Preg, Serum: NEGATIVE

## 2017-03-06 LAB — SURGICAL PCR SCREEN
MRSA, PCR: NEGATIVE
Staphylococcus aureus: NEGATIVE

## 2017-03-07 DIAGNOSIS — Z1231 Encounter for screening mammogram for malignant neoplasm of breast: Secondary | ICD-10-CM | POA: Diagnosis not present

## 2017-03-07 NOTE — Progress Notes (Signed)
Anesthesia Chart Review: Patient is a 36 year old female scheduled for C5-6 ACDF on 03/08/17 by Dr. Shon BatonBrooks.  History includes never smoker, endometriosis, hypothyroidism, asthma, anemia, migraines, CVA (right verterbral artery dissection with small non-hemorrhagic infarct inferior right cerebellum) 09/09/15 (no significant residual symptoms).  PCP is Dr. Neena RhymesKatherine Tabori. She was cleared for surgery following recent 02/15/17 evaluation. She recommended: - Change in medication regimen before surgery: discontinue NSAIDs (Naproxen) 14 days before surgery. - Prophylaxis for cardiac events with perioperative beta-blockers: should be considered, specific regimen per anesthesia. - Invasive hemodynamic monitoring perioperatively: at the discretion of anesthesiologist. - Deep vein thrombosis prophylaxis postoperatively:regimen to be chosen by surgical team. - Surveillance for postoperative MI with ECG immediately postoperatively and on postoperative days 1 and 2 AND troponin levels 24 hours postoperatively and on day 4 or hospital discharge (whichever comes first): at the discretion of anesthesiologist.  Neurologist is Dr. Shon MilletAdam Jaffe. He did recommend that she take ASA 81 mg which she is not currently taking (also instructed to hold per surgeon for surgery).   Meds includes Flonase, Neurontin, levothyroxine, Singulair, fish oil. Azelastine nasal, [employer].    EKG 02/15/17: NSR.  Echo 09/10/15: Study Conclusions - Left ventricle: The cavity size was normal. Wall thickness was   normal. Systolic function was normal. The estimated ejection   fraction was in the range of 60% to 65%. Wall motion was normal;   there were no regional wall motion abnormalities.  CTA neck 09/10/15: IMPRESSION: 1. Focal irregularity and stenosis of the non dominant right vertebral artery at the C1-2 level with segmental irregularity from the out level superiorly to the right PICA origin compatible with focal  dissection. 2. The dominant left vertebral artery originates from the aortic arch without any focal stenosis or irregularity. 3. The anterior circulation is within normal limits. 4. Bilateral common carotid and internal carotid arteries are within normal limits. Bifurcations unremarkable.  (Focal dissection of right vertebral artery at C1-2 had resolved with normal caliber on 12/29/15 CTA.)  Preoperative labs noted. CBC and BMET WNL. Serum pregnancy test negative.   If no acute changes then I anticipate that she can proceed as planned.  Velna Ochsllison Teresa Vidrine, PA-C [facility] Short Stay Center/Anesthesiology Phone [phone] 03/07/2017 10:24 AM

## 2017-03-07 NOTE — Anesthesia Preprocedure Evaluation (Addendum)
Anesthesia Evaluation  Patient identified by MRN, date of birth, ID band Patient awake    Reviewed: Allergy & Precautions, NPO status , Patient's Chart, lab work & pertinent test results  History of Anesthesia Complications Negative for: history of anesthetic complications  Airway Mallampati: II  TM Distance: >3 FB Neck ROM: Full    Dental  (+) Dental Advisory Given, Teeth Intact   Pulmonary asthma (no inhaler needed in several months) ,    breath sounds clear to auscultation       Cardiovascular negative cardio ROS   Rhythm:Regular Rate:Normal  '17 ECHO: EF 60-65%, valves OK   Neuro/Psych Anxiety Depression Bipolar Disorder Herniated cervical disc Vertebral artery dissection Feb 2017 - full recovery per patient CVA, No Residual Symptoms    GI/Hepatic negative GI ROS, Neg liver ROS,   Endo/Other  Hypothyroidism   Renal/GU negative Renal ROS     Musculoskeletal   Abdominal   Peds  Hematology negative hematology ROS (+)   Anesthesia Other Findings   Reproductive/Obstetrics                          Anesthesia Physical Anesthesia Plan  ASA: II  Anesthesia Plan: General   Post-op Pain Management:    Induction: Intravenous  PONV Risk Score and Plan: 4 or greater and Ondansetron, Dexamethasone, Midazolam and Scopolamine patch - Pre-op  Airway Management Planned: Oral ETT and Video Laryngoscope Planned  Additional Equipment:   Intra-op Plan:   Post-operative Plan: Extubation in OR  Informed Consent: I have reviewed the patients History and Physical, chart, labs and discussed the procedure including the risks, benefits and alternatives for the proposed anesthesia with the patient or authorized representative who has indicated his/her understanding and acceptance.   Dental advisory given  Plan Discussed with: CRNA and Surgeon  Anesthesia Plan Comments: (Plan routine monitors, GETA  with VideoGlide intubation)        Anesthesia Quick Evaluation

## 2017-03-08 ENCOUNTER — Ambulatory Visit (HOSPITAL_COMMUNITY)
Admission: RE | Admit: 2017-03-08 | Discharge: 2017-03-09 | Disposition: A | Discharge status: Home / Self Care | Payer: BLUE CROSS/BLUE SHIELD | Source: Ambulatory Visit | Attending: Orthopedic Surgery | Admitting: Orthopedic Surgery

## 2017-03-08 ENCOUNTER — Ambulatory Visit (HOSPITAL_COMMUNITY): Payer: BLUE CROSS/BLUE SHIELD | Admitting: Vascular Surgery

## 2017-03-08 ENCOUNTER — Encounter (HOSPITAL_COMMUNITY)
Admission: RE | Disposition: A | Discharge status: Home / Self Care | Payer: Self-pay | Source: Ambulatory Visit | Attending: Orthopedic Surgery

## 2017-03-08 ENCOUNTER — Encounter (HOSPITAL_COMMUNITY): Payer: Self-pay | Admitting: Urology

## 2017-03-08 ENCOUNTER — Ambulatory Visit (HOSPITAL_COMMUNITY): Payer: BLUE CROSS/BLUE SHIELD | Admitting: Anesthesiology

## 2017-03-08 ENCOUNTER — Ambulatory Visit (HOSPITAL_COMMUNITY): Payer: BLUE CROSS/BLUE SHIELD

## 2017-03-08 DIAGNOSIS — Z419 Encounter for procedure for purposes other than remedying health state, unspecified: Secondary | ICD-10-CM

## 2017-03-08 DIAGNOSIS — E039 Hypothyroidism, unspecified: Secondary | ICD-10-CM | POA: Diagnosis not present

## 2017-03-08 DIAGNOSIS — M5412 Radiculopathy, cervical region: Secondary | ICD-10-CM | POA: Insufficient documentation

## 2017-03-08 DIAGNOSIS — M50122 Cervical disc disorder at C5-C6 level with radiculopathy: Secondary | ICD-10-CM | POA: Diagnosis not present

## 2017-03-08 DIAGNOSIS — M50022 Cervical disc disorder at C5-C6 level with myelopathy: Secondary | ICD-10-CM | POA: Diagnosis not present

## 2017-03-08 DIAGNOSIS — J45909 Unspecified asthma, uncomplicated: Secondary | ICD-10-CM | POA: Diagnosis not present

## 2017-03-08 DIAGNOSIS — M50222 Other cervical disc displacement at C5-C6 level: Secondary | ICD-10-CM | POA: Diagnosis not present

## 2017-03-08 DIAGNOSIS — Z8673 Personal history of transient ischemic attack (TIA), and cerebral infarction without residual deficits: Secondary | ICD-10-CM | POA: Diagnosis not present

## 2017-03-08 DIAGNOSIS — R531 Weakness: Secondary | ICD-10-CM | POA: Diagnosis not present

## 2017-03-08 DIAGNOSIS — M2578 Osteophyte, vertebrae: Secondary | ICD-10-CM | POA: Diagnosis not present

## 2017-03-08 DIAGNOSIS — M4802 Spinal stenosis, cervical region: Secondary | ICD-10-CM | POA: Insufficient documentation

## 2017-03-08 DIAGNOSIS — M48062 Spinal stenosis, lumbar region with neurogenic claudication: Secondary | ICD-10-CM | POA: Diagnosis not present

## 2017-03-08 DIAGNOSIS — Z981 Arthrodesis status: Secondary | ICD-10-CM

## 2017-03-08 HISTORY — PX: ANTERIOR CERVICAL DECOMP/DISCECTOMY FUSION: SHX1161

## 2017-03-08 SURGERY — ANTERIOR CERVICAL DECOMPRESSION/DISCECTOMY FUSION 1 LEVEL
Anesthesia: General

## 2017-03-08 MED ORDER — PHENYLEPHRINE 40 MCG/ML (10ML) SYRINGE FOR IV PUSH (FOR BLOOD PRESSURE SUPPORT)
PREFILLED_SYRINGE | INTRAVENOUS | Status: DC | PRN
  Administered 2017-03-08 (×5): 80 ug via INTRAVENOUS

## 2017-03-08 MED ORDER — POLYETHYLENE GLYCOL 3350 17 G PO PACK: 17.0000 g | PACK | Freq: Every day | ORAL | Status: DC | PRN

## 2017-03-08 MED ORDER — MIDAZOLAM HCL 2 MG/2ML IJ SOLN
INTRAMUSCULAR | Status: AC
  Filled 2017-03-08: qty 2

## 2017-03-08 MED ORDER — ONDANSETRON HCL 4 MG PO TABS: 4.0000 mg | ORAL_TABLET | Freq: Four times a day (QID) | ORAL | Status: DC | PRN

## 2017-03-08 MED ORDER — METHOCARBAMOL 500 MG PO TABS
500.0000 mg | ORAL_TABLET | Freq: Four times a day (QID) | ORAL | Status: DC
  Administered 2017-03-08 – 2017-03-09 (×4): 500 mg via ORAL
  Filled 2017-03-08 (×4): qty 1

## 2017-03-08 MED ORDER — MONTELUKAST SODIUM 10 MG PO TABS
10.0000 mg | ORAL_TABLET | Freq: Every day | ORAL | Status: DC
  Administered 2017-03-08: 10 mg via ORAL
  Filled 2017-03-08: qty 1

## 2017-03-08 MED ORDER — OXYCODONE HCL 5 MG PO TABS: 5.0000 mg | ORAL_TABLET | ORAL | Status: DC | PRN

## 2017-03-08 MED ORDER — THROMBIN 20000 UNITS EX SOLR
CUTANEOUS | Status: AC
  Filled 2017-03-08: qty 20000

## 2017-03-08 MED ORDER — PHENOL 1.4 % MT LIQD: 1.0000 | OROMUCOSAL | Status: DC | PRN

## 2017-03-08 MED ORDER — SCOPOLAMINE 1 MG/3DAYS TD PT72
MEDICATED_PATCH | TRANSDERMAL | Status: DC | PRN
  Administered 2017-03-08: 1 via TRANSDERMAL

## 2017-03-08 MED ORDER — ACETAMINOPHEN 325 MG PO TABS: 650.0000 mg | ORAL_TABLET | ORAL | Status: DC | PRN

## 2017-03-08 MED ORDER — LACTATED RINGERS IV SOLN
INTRAVENOUS | Status: DC | PRN
  Administered 2017-03-08 (×2): via INTRAVENOUS

## 2017-03-08 MED ORDER — CEFAZOLIN SODIUM-DEXTROSE 2-4 GM/100ML-% IV SOLN
2.0000 g | INTRAVENOUS | Status: AC
  Administered 2017-03-08: 2 g via INTRAVENOUS
  Filled 2017-03-08: qty 100

## 2017-03-08 MED ORDER — HYDROMORPHONE HCL 1 MG/ML IJ SOLN
INTRAMUSCULAR | Status: AC
  Filled 2017-03-08: qty 1

## 2017-03-08 MED ORDER — ACETAMINOPHEN 10 MG/ML IV SOLN
1000.0000 mg | INTRAVENOUS | Status: AC
  Administered 2017-03-08: 1000 mg via INTRAVENOUS
  Filled 2017-03-08: qty 100

## 2017-03-08 MED ORDER — FENTANYL CITRATE (PF) 250 MCG/5ML IJ SOLN
INTRAMUSCULAR | Status: AC
  Filled 2017-03-08: qty 5

## 2017-03-08 MED ORDER — PROMETHAZINE HCL 25 MG/ML IJ SOLN: 6.2500 mg | INTRAMUSCULAR | Status: DC | PRN

## 2017-03-08 MED ORDER — MIDAZOLAM HCL 2 MG/2ML IJ SOLN: 0.5000 mg | Freq: Once | INTRAMUSCULAR | Status: DC | PRN

## 2017-03-08 MED ORDER — MIDAZOLAM HCL 5 MG/5ML IJ SOLN
INTRAMUSCULAR | Status: DC | PRN
  Administered 2017-03-08: 2 mg via INTRAVENOUS

## 2017-03-08 MED ORDER — MORPHINE SULFATE (PF) 4 MG/ML IV SOLN
2.0000 mg | INTRAVENOUS | Status: DC | PRN
  Administered 2017-03-08: 2 mg via INTRAVENOUS
  Filled 2017-03-08: qty 1

## 2017-03-08 MED ORDER — LEVOTHYROXINE SODIUM 100 MCG PO TABS
100.0000 ug | ORAL_TABLET | Freq: Every day | ORAL | Status: DC
  Administered 2017-03-09: 100 ug via ORAL
  Filled 2017-03-08: qty 1

## 2017-03-08 MED ORDER — ONDANSETRON HCL 4 MG/2ML IJ SOLN
INTRAMUSCULAR | Status: DC | PRN
  Administered 2017-03-08: 4 mg via INTRAVENOUS

## 2017-03-08 MED ORDER — MEPERIDINE HCL 25 MG/ML IJ SOLN: 6.2500 mg | INTRAMUSCULAR | Status: DC | PRN

## 2017-03-08 MED ORDER — ROCURONIUM BROMIDE 100 MG/10ML IV SOLN
INTRAVENOUS | Status: DC | PRN
  Administered 2017-03-08: 50 mg via INTRAVENOUS

## 2017-03-08 MED ORDER — LIDOCAINE HCL (CARDIAC) 20 MG/ML IV SOLN
INTRAVENOUS | Status: DC | PRN
  Administered 2017-03-08: 40 mg via INTRAVENOUS

## 2017-03-08 MED ORDER — NEOSTIGMINE METHYLSULFATE 10 MG/10ML IV SOLN
INTRAVENOUS | Status: DC | PRN
  Administered 2017-03-08: 4 mg via INTRAVENOUS

## 2017-03-08 MED ORDER — ONDANSETRON HCL 4 MG/2ML IJ SOLN
INTRAMUSCULAR | Status: AC
  Filled 2017-03-08: qty 2

## 2017-03-08 MED ORDER — DEXAMETHASONE SODIUM PHOSPHATE 10 MG/ML IJ SOLN
INTRAMUSCULAR | Status: AC
  Filled 2017-03-08: qty 1

## 2017-03-08 MED ORDER — OXYCODONE-ACETAMINOPHEN 5-325 MG PO TABS
1.0000 | ORAL_TABLET | ORAL | Status: DC | PRN
  Administered 2017-03-08 – 2017-03-09 (×4): 2 via ORAL
  Filled 2017-03-08 (×5): qty 2

## 2017-03-08 MED ORDER — PROPOFOL 10 MG/ML IV BOLUS
INTRAVENOUS | Status: AC
  Filled 2017-03-08: qty 20

## 2017-03-08 MED ORDER — PHENYLEPHRINE 40 MCG/ML (10ML) SYRINGE FOR IV PUSH (FOR BLOOD PRESSURE SUPPORT)
PREFILLED_SYRINGE | INTRAVENOUS | Status: AC
  Filled 2017-03-08: qty 10

## 2017-03-08 MED ORDER — BUPIVACAINE-EPINEPHRINE 0.25% -1:200000 IJ SOLN
INTRAMUSCULAR | Status: DC | PRN
  Administered 2017-03-08: 4 mL

## 2017-03-08 MED ORDER — ROCURONIUM BROMIDE 10 MG/ML (PF) SYRINGE
PREFILLED_SYRINGE | INTRAVENOUS | Status: AC
  Filled 2017-03-08: qty 5

## 2017-03-08 MED ORDER — PROPOFOL 10 MG/ML IV BOLUS
INTRAVENOUS | Status: DC | PRN
  Administered 2017-03-08: 100 mg via INTRAVENOUS

## 2017-03-08 MED ORDER — METHOCARBAMOL 500 MG PO TABS: 500.0000 mg | ORAL_TABLET | Freq: Four times a day (QID) | ORAL | Status: DC | PRN

## 2017-03-08 MED ORDER — LACTATED RINGERS IV SOLN
INTRAVENOUS | Status: DC
Start: 2017-03-08 — End: 2017-03-09

## 2017-03-08 MED ORDER — SODIUM CHLORIDE 0.9% FLUSH: 3.0000 mL | INTRAVENOUS | Status: DC | PRN

## 2017-03-08 MED ORDER — GLYCOPYRROLATE 0.2 MG/ML IJ SOLN
INTRAMUSCULAR | Status: DC | PRN
  Administered 2017-03-08: 0.4 mg via INTRAVENOUS

## 2017-03-08 MED ORDER — THROMBIN 20000 UNITS EX SOLR
CUTANEOUS | Status: DC | PRN
  Administered 2017-03-08: 20000 [IU] via TOPICAL

## 2017-03-08 MED ORDER — SODIUM CHLORIDE 0.9% FLUSH
3.0000 mL | Freq: Two times a day (BID) | INTRAVENOUS | Status: DC
  Administered 2017-03-08 (×2): 3 mL via INTRAVENOUS

## 2017-03-08 MED ORDER — DOCUSATE SODIUM 100 MG PO CAPS
100.0000 mg | ORAL_CAPSULE | Freq: Two times a day (BID) | ORAL | Status: DC
  Administered 2017-03-08 – 2017-03-09 (×2): 100 mg via ORAL
  Filled 2017-03-08 (×2): qty 1

## 2017-03-08 MED ORDER — SCOPOLAMINE 1 MG/3DAYS TD PT72
MEDICATED_PATCH | TRANSDERMAL | Status: AC
  Filled 2017-03-08: qty 1

## 2017-03-08 MED ORDER — FENTANYL CITRATE (PF) 100 MCG/2ML IJ SOLN
INTRAMUSCULAR | Status: DC | PRN
  Administered 2017-03-08: 250 ug via INTRAVENOUS

## 2017-03-08 MED ORDER — NEOSTIGMINE METHYLSULFATE 5 MG/5ML IV SOSY
PREFILLED_SYRINGE | INTRAVENOUS | Status: AC
  Filled 2017-03-08: qty 5

## 2017-03-08 MED ORDER — CEFAZOLIN SODIUM-DEXTROSE 2-4 GM/100ML-% IV SOLN
2.0000 g | Freq: Three times a day (TID) | INTRAVENOUS | Status: AC
  Administered 2017-03-08 (×2): 2 g via INTRAVENOUS
  Filled 2017-03-08: qty 100

## 2017-03-08 MED ORDER — MENTHOL 3 MG MT LOZG: 1.0000 | LOZENGE | OROMUCOSAL | Status: DC | PRN

## 2017-03-08 MED ORDER — HYDROMORPHONE HCL 1 MG/ML IJ SOLN: 0.2500 mg | INTRAMUSCULAR | Status: DC | PRN

## 2017-03-08 MED ORDER — BUPIVACAINE-EPINEPHRINE (PF) 0.25% -1:200000 IJ SOLN
INTRAMUSCULAR | Status: AC
  Filled 2017-03-08: qty 30

## 2017-03-08 MED ORDER — LIDOCAINE 2% (20 MG/ML) 5 ML SYRINGE
INTRAMUSCULAR | Status: AC
  Filled 2017-03-08: qty 5

## 2017-03-08 MED ORDER — DEXAMETHASONE SODIUM PHOSPHATE 10 MG/ML IJ SOLN
INTRAMUSCULAR | Status: DC | PRN
  Administered 2017-03-08: 10 mg via INTRAVENOUS

## 2017-03-08 MED ORDER — ONDANSETRON HCL 4 MG/2ML IJ SOLN
4.0000 mg | Freq: Four times a day (QID) | INTRAMUSCULAR | Status: DC | PRN
  Administered 2017-03-08: 4 mg via INTRAVENOUS
  Filled 2017-03-08: qty 2

## 2017-03-08 MED ORDER — ACETAMINOPHEN 650 MG RE SUPP: 650.0000 mg | RECTAL | Status: DC | PRN

## 2017-03-08 MED ORDER — MAGNESIUM CITRATE PO SOLN: 1.0000 | Freq: Once | ORAL | Status: DC | PRN

## 2017-03-08 MED ORDER — METHOCARBAMOL 500 MG PO TABS: 500.0000 mg | ORAL_TABLET | Freq: Four times a day (QID) | ORAL | 0 refills | Status: DC | PRN

## 2017-03-08 MED ORDER — OXYCODONE-ACETAMINOPHEN 10-325 MG PO TABS: 1.0000 | ORAL_TABLET | ORAL | 0 refills | Status: DC | PRN

## 2017-03-08 MED ORDER — METHOCARBAMOL 1000 MG/10ML IJ SOLN
500.0000 mg | Freq: Four times a day (QID) | INTRAVENOUS | Status: DC | PRN
  Filled 2017-03-08: qty 5

## 2017-03-08 MED ORDER — ONDANSETRON 4 MG PO TBDP: 4.0000 mg | ORAL_TABLET | Freq: Three times a day (TID) | ORAL | 0 refills | Status: DC | PRN

## 2017-03-08 MED ORDER — GABAPENTIN 300 MG PO CAPS
300.0000 mg | ORAL_CAPSULE | Freq: Every day | ORAL | Status: DC
  Administered 2017-03-08: 300 mg via ORAL
  Filled 2017-03-08: qty 1

## 2017-03-08 MED ORDER — 0.9 % SODIUM CHLORIDE (POUR BTL) OPTIME
TOPICAL | Status: DC | PRN
  Administered 2017-03-08 (×3): 1000 mL

## 2017-03-08 SURGICAL SUPPLY — 59 items
BIT DRILL SKYLINE 12MM (BIT) ×1 IMPLANT
BLADE CLIPPER SURG (BLADE) IMPLANT
BONE VIVIGEN FORMABLE 1.3CC (Bone Implant) ×2 IMPLANT
CAGE LORDOTIC 6 SM (Cage) ×2 IMPLANT
CANISTER SUCT 3000ML PPV (MISCELLANEOUS) ×2 IMPLANT
CLSR STERI-STRIP ANTIMIC 1/2X4 (GAUZE/BANDAGES/DRESSINGS) ×2 IMPLANT
CORDS BIPOLAR (ELECTRODE) ×2 IMPLANT
COVER BACK TABLE 60X90IN (DRAPES) ×2 IMPLANT
COVER SURGICAL LIGHT HANDLE (MISCELLANEOUS) ×4 IMPLANT
CRADLE DONUT ADULT HEAD (MISCELLANEOUS) ×2 IMPLANT
DRAPE C-ARM 42X72 X-RAY (DRAPES) ×2 IMPLANT
DRAPE POUCH INSTRU U-SHP 10X18 (DRAPES) ×2 IMPLANT
DRAPE SURG 17X23 STRL (DRAPES) ×2 IMPLANT
DRAPE U-SHAPE 47X51 STRL (DRAPES) ×2 IMPLANT
DRILL BIT SKYLINE 12MM (BIT) ×1
DRSG AQUACEL AG ADV 3.5X 4 (GAUZE/BANDAGES/DRESSINGS) IMPLANT
DRSG OPSITE POSTOP 3X4 (GAUZE/BANDAGES/DRESSINGS) ×2 IMPLANT
DURAPREP 6ML APPLICATOR 50/CS (WOUND CARE) ×2 IMPLANT
ELECT COATED BLADE 2.86 ST (ELECTRODE) ×2 IMPLANT
ELECT PENCIL ROCKER SW 15FT (MISCELLANEOUS) ×2 IMPLANT
ELECT REM PT RETURN 9FT ADLT (ELECTROSURGICAL) ×2
ELECTRODE REM PT RTRN 9FT ADLT (ELECTROSURGICAL) ×1 IMPLANT
GLOVE BIO SURGEON STRL SZ 6.5 (GLOVE) ×2 IMPLANT
GLOVE BIOGEL PI IND STRL 6.5 (GLOVE) ×1 IMPLANT
GLOVE BIOGEL PI IND STRL 8.5 (GLOVE) ×1 IMPLANT
GLOVE BIOGEL PI INDICATOR 6.5 (GLOVE) ×1
GLOVE BIOGEL PI INDICATOR 8.5 (GLOVE) ×1
GLOVE SS BIOGEL STRL SZ 8.5 (GLOVE) ×1 IMPLANT
GLOVE SUPERSENSE BIOGEL SZ 8.5 (GLOVE) ×1
GOWN STRL REUS W/ TWL LRG LVL3 (GOWN DISPOSABLE) ×1 IMPLANT
GOWN STRL REUS W/TWL 2XL LVL3 (GOWN DISPOSABLE) ×4 IMPLANT
GOWN STRL REUS W/TWL LRG LVL3 (GOWN DISPOSABLE) ×1
KIT BASIN OR (CUSTOM PROCEDURE TRAY) ×2 IMPLANT
KIT ROOM TURNOVER OR (KITS) ×2 IMPLANT
NEEDLE SPNL 18GX3.5 QUINCKE PK (NEEDLE) ×2 IMPLANT
NS IRRIG 1000ML POUR BTL (IV SOLUTION) ×6 IMPLANT
PACK ORTHO CERVICAL (CUSTOM PROCEDURE TRAY) ×2 IMPLANT
PACK UNIVERSAL I (CUSTOM PROCEDURE TRAY) ×2 IMPLANT
PAD ARMBOARD 7.5X6 YLW CONV (MISCELLANEOUS) ×4 IMPLANT
PATTIES SURGICAL .25X.25 (GAUZE/BANDAGES/DRESSINGS) IMPLANT
PIN DISTRACTION 14 (PIN) ×4 IMPLANT
PLATE SKYLINE 12MM (Plate) ×8 IMPLANT
RESTRAINT LIMB HOLDER UNIV (RESTRAINTS) ×2 IMPLANT
SCREW SELF DRILL SKYLINE 12MM (Screw) ×8 IMPLANT
SPONGE INTESTINAL PEANUT (DISPOSABLE) ×2 IMPLANT
SPONGE SURGIFOAM ABS GEL 100 (HEMOSTASIS) ×2 IMPLANT
SURGIFLO W/THROMBIN 8M KIT (HEMOSTASIS) ×2 IMPLANT
SUT BONE WAX W31G (SUTURE) ×2 IMPLANT
SUT MON AB 3-0 SH 27 (SUTURE) ×1
SUT MON AB 3-0 SH27 (SUTURE) ×1 IMPLANT
SUT VIC AB 2-0 CT1 18 (SUTURE) ×2 IMPLANT
SYR BULB IRRIGATION 50ML (SYRINGE) ×2 IMPLANT
SYR CONTROL 10ML LL (SYRINGE) ×4 IMPLANT
TAPE CLOTH 4X10 WHT NS (GAUZE/BANDAGES/DRESSINGS) ×2 IMPLANT
TAPE UMBILICAL COTTON 1/8X30 (MISCELLANEOUS) ×2 IMPLANT
TOWEL OR 17X24 6PK STRL BLUE (TOWEL DISPOSABLE) ×2 IMPLANT
TOWEL OR 17X26 10 PK STRL BLUE (TOWEL DISPOSABLE) ×2 IMPLANT
WATER STERILE IRR 1000ML POUR (IV SOLUTION) ×2 IMPLANT
YANKAUER SUCT BULB TIP NO VENT (SUCTIONS) ×2 IMPLANT

## 2017-03-08 NOTE — Op Note (Signed)
Operative report.  Preoperative diagnosis. Cervical radiculopathy with left C6. There is C5/6 foraminal stenosis with nerve compression.  Postoperative diagnosis. Same.  Operative procedure. Anterior cervical discectomy and fusion C5/6.  Instrumentation system used. Depew anterior cervical skyline plate. 12 mm length. 12 mm self drilling screws. Titan nano lock intervertebral cage. 6 mm lordotic small. Vivogen allograft bone.  Complications. None.  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  Indications. Pleasant 36 year old woman with a two-month history of progressive radicular arm pain, weakness. Clinical examination and radiographs confirm C6 nerve compression secondary to hard discussed right C5-6 foramen. Discussed treatment options both surgical, and nonsurgical. Elected to move forward with anterior cervical discectomy and fusion. All appropriate risks benefits and alternatives were discussed with the patient and her family.  Operative note.  Patient was brought the operating room placed supine the operating room table. After successful induction of general anesthesia and endotracheal intubation teds SCDs were applied. The arms were tucked at the side and the anterior cervical spine was prepped and draped in a standard fashion. Timeout was taken to confirm patient procedure and all other pertinent and important data.  X-ray was used to identify the C5-6 disc space and the incision site was marked out. Was then infiltrated with quarter percent Marcaine with epinephrine. A transverse incision was made centered over the C5-6 disc space starting near the midline and proceeding to the left. Sharp dissection was carried out down to the platysma. Platysma was sharply incised. The external jugular was clearly visualized and protected. I then began dissecting along the medial border of the sternocleidomastoid muscle performing a standard Clementeen GrahamSmith Robinson approach to the anterior cervical spine. I sharply  dissected through the deep cervical and prevertebral fascia. Using my finger I then palpated the anterior aspect of cervical spine and swept the esophagus to the right and maintained it in this position using a hand-held retractor. I could now visualize the anterior cervical spine. Using Kitner dissectors I remove the remaining prevertebral fascia to completely expose the affected disc level. A needle was placed into the 56 disc space and an x-ray was taken. I confirm that I was at the appropriate level. Using bipolar cautery I mobilized the longus coli muscle from the mid body of C5 to the mid body of C6. Self-retaining Caspar retractor blades were placed underneath the longus coli muscle the endotracheal cuff was deflated and extended retractor to the appropriate width. At this point I then performed annulotomy with a 15 blade scalpel. Using pituitary rongeur I removed the bulk of the disc material. 2 mm Kerrison rongeur was used to remove the overhanging osteophyte from the inferior aspect of the C5 vertebral body. Distraction pins were placed into the midportion of C5 and C6 vertebral body. I then distracted the intervertebral space and made the and expanded distracting pins to maintain the distraction  Using curettes I removed the rest of the disc material and the posterior osteophyte from the posterior aspect of the C5 and C6 vertebral bodies. Using a 1 mm Kerrison I remove the posterior annulus. Using a fine nerve hook I developed a plane underneath the posterior longitudinal ligament and resected this. I then undercut the uncovertebral joint on that right side and so that an adequate decompression. With the center and right portion of the decompression complete I now had enough space to used by 1 mm Kerrison to undercut the bone spur in the left posterior lateral recess and foramen. At this point once I had removed that I had an  adequate decompression. I placed a was able to freely placed my nerve hook out  into the foramen and rotated. At this point I was pleased with that left-sided decompression.  The endplates were then rasped to ensure that bleeding subchondral bone. I then trialed and elected to use a size 6 small Titan nano lock cage. It is packed with the allograft bone (vivogen) and malleted to the appropriate depth.. Good apposition and I was able to restore the normal cervical lordosis. Preoperatively she did have sagittal imbalance with loss of local lordosis. At this point I was pleased with the intervertebral graft as well as the overall improved lordotic alignment of the cervical spine. The anterior cervical plate was obtained and secured to the vertebral body with 12 mm self drilling screws. All screws had excellent purchase. I then locked each screw according manufacturer's standards.  This report retractors were removed and I copiously irrigated the area with normal saline and made sure hemostasis using Surgicel flow and bipolar cautery. After final irrigation and ensuring hemostasis I returned the trachea and esophagus to midline and closed the platysma with interrupted 2-0 Vicryl sutures. I then closed the skin with 3-0 Monocryl. Steri-Strips dry dressing and Aspen collar were applied. Patient was extubated transferred the PACU that incident. The end of the case all needle and sponge counts were correct

## 2017-03-08 NOTE — Discharge Instructions (Signed)
Anterior Cervical Diskectomy and Fusion, Care After °Refer to this sheet in the next few weeks. These instructions provide you with information about caring for yourself after your procedure. Your health care provider may also give you more specific instructions. Your treatment has been planned according to current medical practices, but problems sometimes occur. Call your health care provider if you have any problems or questions after your procedure. °What can I expect after the procedure? °After the procedure, it is common to have: °· Neck pain. °· Discomfort when swallowing. °· Slight hoarseness. ° °Follow these instructions at home: °If You Have a Neck Brace: °· Wear it as told by your health care provider. Remove it only as told by your health care provider. °· Keep the brace clean and dry. °Incision care ° °· Follow instructions from your health care provider about how to take care of the cut made during surgery (incision). Make sure you: °? Wash your hands with soap and water before you change your bandage (dressing). If soap and water are not available, use hand sanitizer. °? Change your dressing as told by your health care provider. °? Leave stitches (sutures), skin glue, or adhesive strips in place. These skin closures may need to stay in place for 2 weeks or longer. If adhesive strip edges start to loosen and curl up, you may trim the loose edges. Do not remove adhesive strips completely unless your health care provider tells you to do that. °· Check your incision every day for signs of infection. Watch for: °? Redness, swelling, or pain. °? Fluid or blood. °? Warmth. °? Pus or a bad smell. °Managing pain, stiffness, and swelling °· Take over-the-counter and prescription medicines only as told by your health care provider. °· If directed, apply ice to the injured area. °? Put ice in a plastic bag. °? Place a towel between your skin and the bag. °? Leave the ice on for 20 minutes, 2-3 times per  day. °Activity °· Return to your normal activities as told by your health care provider. Ask your health care provider what activities are safe for you. °· Do exercises as told by your health care provider. °· Do not lift anything that is heavier than 10 lb (4.5 kg). °General instructions °· Do not drive or operate heavy machinery while taking prescription pain medicines. °· Do not use any tobacco products, including cigarettes, chewing tobacco, or e-cigarettes. Tobacco can delay healing. If you need help quitting, ask your health care provider. °· Keep all follow-up visits and physical therapy appointments as told by your health care provider. This is important. °Contact a health care provider if: °· You have a fever. °· You have redness, swelling, or pain around your incision. °· You have fluid or blood coming from your incision. °· You have pus or a bad smell coming from your incision. °· You have pain that is not controlled by your pain medicine. °· You have increasing hoarseness or trouble swallowing. °Get help right away if: °· You have severe pain. °· You have sudden numbness or weakness in your arms. °· You have warmth, tenderness, or swelling in your calf. °· You have chest pain. °· You have difficulty breathing. °This information is not intended to replace advice given to you by your health care provider. Make sure you discuss any questions you have with your health care provider. °Document Released: 08/20/2015 Document Revised: 12/30/2015 Document Reviewed: 07/08/2015 °Elsevier Interactive Patient Education © 2018 [employer] ° °

## 2017-03-08 NOTE — Evaluation (Signed)
Physical Therapy Evaluation and Discharge Patient Details Name: Teresa Guzman MRN: [id number] DOB: 06/11/1981 Today's Date: 03/08/2017   History of Present Illness  Pt is a 36 y/o female who presents s/p C5-C6 ACDF on 03/08/17.  Clinical Impression  Patient evaluated by Physical Therapy with no further acute PT needs identified. All education has been completed and the patient has no further questions. At the time of PT eval pt was able to perform transfers and ambulation with modified independence and no AD. Pt reports continued numbness/pain in her UE. PT adjusted cervical collar for optimal fit prior to mobility beginning. See below for any follow-up Physical Therapy or equipment needs. PT is signing off. Thank you for this referral.     Follow Up Recommendations No PT follow up;Supervision - Intermittent    Equipment Recommendations  None recommended by PT (May require 3-in-1. Will defer to OT)    Recommendations for Other Services       Precautions / Restrictions Precautions Precautions: Fall;Cervical Precaution Comments: Reviewed handout with pt, and she was cued for precautions during functional mobility Required Braces or Orthoses: Cervical Brace Cervical Brace: Hard collar;At all times Restrictions Weight Bearing Restrictions: No      Mobility  Bed Mobility Overal bed mobility: Modified Independent             General bed mobility comments: VC's for log roll technique. No assist required.   Transfers Overall transfer level: Modified independent Equipment used: None             General transfer comment: Pt transitioned to/from standing without assistance. No unsteadiness noted.   Ambulation/Gait Ambulation/Gait assistance: Modified independent (Device/Increase time) Ambulation Distance (Feet): 300 Feet Assistive device: None Gait Pattern/deviations: WFL(Within Functional Limits) Gait velocity: Decreased Gait velocity interpretation: Below normal speed for  age/gender General Gait Details: Pt ambulating well with no unsteadiness noted. Pt demonstrated direction changes, speed changes, and good maintenance of precautions.  Stairs Stairs: Yes Stairs assistance: Modified independent (Device/Increase time) Stair Management: One rail Right;Alternating pattern;Forwards Number of Stairs: 3 General stair comments: Limited by IV pole. No unsteadiness or LOB noted.   Wheelchair Mobility    Modified Rankin (Stroke Patients Only)       Balance Overall balance assessment: Modified Independent                                           Pertinent Vitals/Pain Pain Assessment: Faces Faces Pain Scale: Hurts a little bit Pain Location: Incision site Pain Descriptors / Indicators: Operative site guarding Pain Intervention(s): Limited activity within patient's tolerance;Monitored during session;Repositioned    Home Living Family/patient expects to be discharged to:: Private residence Living Arrangements: Spouse/significant other;Children Available Help at Discharge: Family;Available 24 hours/day Type of Home: House Home Access: Stairs to enter   Enter[employer] CorporationEntrance Stairs-Number of Steps: 3 Home Layout: One level Home Equipment: None      Prior Function Level of Independence: Independent               Hand Dominance        Extremity/Trunk Assessment   Upper Extremity Assessment Upper Extremity Assessment: Defer to OT evaluation    Lower Extremity Assessment Lower Extremity Assessment: Overall WFL for tasks assessed    Cervical / Trunk Assessment Cervical / Trunk Assessment: Other exceptions Cervical / Trunk Exceptions: s/p surgery  Communication   Communication: No difficulties  Cognition Arousal/Alertness:  Awake/alert Behavior During Therapy: WFL for tasks assessed/performed Overall Cognitive Status: Within Functional Limits for tasks assessed                                        General  Comments      Exercises     Assessment/Plan    PT Assessment Patent does not need any further PT services  PT Problem List         PT Treatment Interventions      PT Goals (Current goals can be found in the Care Plan section)  Acute Rehab PT Goals Patient Stated Goal: Independent PT Goal Formulation: All assessment and education complete, DC therapy    Frequency     Barriers to discharge        Co-evaluation               AM-PAC PT "6 Clicks" Daily Activity  Outcome Measure Difficulty turning over in bed (including adjusting bedclothes, sheets and blankets)?: None Difficulty moving from lying on back to sitting on the side of the bed? : None Difficulty sitting down on and standing up from a chair with arms (e.g., wheelchair, bedside commode, etc,.)?: None Help needed moving to and from a bed to chair (including a wheelchair)?: None Help needed walking in hospital room?: None Help needed climbing 3-5 steps with a railing? : None 6 Click Score: 24    End of Session Equipment Utilized During Treatment: Cervical collar Activity Tolerance: Patient tolerated treatment well Patient left: in chair;with call bell/phone within reach;with family/visitor present Nurse Communication: Mobility status PT Visit Diagnosis: Pain;Other symptoms and signs involving the nervous system (R29.898) Pain - part of body:  (neck)    Time: [id number] PT Time Calculation (min) (ACUTE ONLY): 21 min   Charges:   PT Evaluation $PT Eval Moderate Complexity: 1 Mod     PT G Codes:        Teresa Guzman, PT, DPT Acute Rehabilitation Services Pager: [phone]   Teresa PearsonLaura D Rebeca Guzman 03/08/2017, 1:20 PM

## 2017-03-08 NOTE — Brief Op Note (Signed)
03/08/2017  9:48 AM  PATIENT:  Marybella T Dupuy  36 y.o. female  PRE-OPERATIVE DIAGNOSIS:  Left C6 weakness, left C5-6 HNP/osteophyte  POST-OPERATIVE DIAGNOSIS:  Left C6 weakness, left C5-6 HNP/osteophyte  PROCEDURE:  Procedure(s) with comments: ANTERIOR CERVICAL DECOMPRESSION/DISCECTOMY FUSION C5-6 (N/A) - 3 hours  SURGEON:  Surgeon(s) and Role:    Venita Lick* Joan Avetisyan, MD - Primary  PHYSICIAN ASSISTANT:   ASSISTANTS: Carmen Mayo   ANESTHESIA:   general  EBL:  Total I/O In: 1000 [I.V.:1000] Out: -   BLOOD ADMINISTERED:none  DRAINS: none   LOCAL MEDICATIONS USED:  MARCAINE     SPECIMEN:  No Specimen  DISPOSITION OF SPECIMEN:  N/A  COUNTS:  YES  TOURNIQUET:  * No tourniquets in log *  DICTATION: .Dragon Dictation  PLAN OF CARE: Admit for overnight observation  PATIENT DISPOSITION:  PACU - hemodynamically stable.

## 2017-03-08 NOTE — Transfer of Care (Signed)
Immediate Anesthesia Transfer of Care Note  Patient: Teresa Guzman  Procedure(s) Performed: Procedure(s) with comments: ANTERIOR CERVICAL DECOMPRESSION/DISCECTOMY FUSION C5-6 (N/A) - 3 hours  Patient Location: PACU  Anesthesia Type:General  Level of Consciousness: awake, oriented and patient cooperative  Airway & Oxygen Therapy: Patient Spontanous Breathing and Patient connected to nasal cannula oxygen  Post-op Assessment: Report given to RN and Post -op Vital signs reviewed and stable  Post vital signs: Reviewed  Last Vitals:  Vitals:   03/08/17 0556  BP: 115/78  Pulse: 72  Resp: 20  Temp: 36.7 C    Last Pain:  Vitals:   03/08/17 0556  TempSrc: Oral  PainSc:          Complications: No apparent anesthesia complications

## 2017-03-08 NOTE — Anesthesia Procedure Notes (Signed)
Procedure Name: Intubation Date/Time: 03/08/2017 7:46 AM Performed by: Lovie CholOCK, Florinda Taflinger K Pre-anesthesia Checklist: Patient identified, Emergency Drugs available, Suction available and Patient being monitored Patient Re-evaluated:Patient Re-evaluated prior to induction Oxygen Delivery Method: Circle System Utilized Preoxygenation: Pre-oxygenation with 100% oxygen Induction Type: IV induction Ventilation: Mask ventilation without difficulty Grade View: Grade I Tube type: Oral Tube size: 7.0 mm Number of attempts: 1 Airway Equipment and Method: Stylet and Video-laryngoscopy Placement Confirmation: ETT inserted through vocal cords under direct vision,  positive ETCO2 and breath sounds checked- equal and bilateral Secured at: 21 cm Tube secured with: Tape Dental Injury: Teeth and Oropharynx as per pre-operative assessment

## 2017-03-08 NOTE — Anesthesia Postprocedure Evaluation (Signed)
Anesthesia Post Note  Patient: Teresa Guzman  Procedure(s) Performed: Procedure(s) (LRB): ANTERIOR CERVICAL DECOMPRESSION/DISCECTOMY FUSION C5-6 (N/A)     Patient location during evaluation: PACU Anesthesia Type: General Level of consciousness: awake and alert, patient cooperative and oriented Pain management: pain level controlled Vital Signs Assessment: post-procedure vital signs reviewed and stable Respiratory status: spontaneous breathing, nonlabored ventilation and respiratory function stable Cardiovascular status: blood pressure returned to baseline and stable Postop Assessment: no signs of nausea or vomiting Anesthetic complications: no    Last Vitals:  Vitals:   03/08/17 1030 03/08/17 1051  BP:  101/61  Pulse: 63 71  Resp: 17 18  Temp: 36.5 C 36.5 C    Last Pain:  Vitals:   03/08/17 1051  TempSrc: Oral  PainSc:                  Susi Goslin,E. Tonji Elliff

## 2017-03-08 NOTE — H&P (Signed)
History of Present Illness The patient is a 36 year old female who comes in today for a preoperative History and Physical. The patient is scheduled for a ACDF C5-6 to be performed by Dr. Debria Garretahari D. [facility]atonBrooks[facility] . Please see the hospital record for complete dictated history and physical. The pt has a hx of a CVA. the pt has a hx of asthma.  Problem List/Past Medical  Cervical pain (M54.2)  Problems Reconciled   Allergies  Neosporin + Pain Relief Max St *DERMATOLOGICALS*  Rash. Allergies Reconciled   Family History  Cancer  Brother. Depression  Mother. Heart Disease  Father. Hypertension  Father. Osteoarthritis  Maternal Grandmother.  Social History  Children  2 Current work status  working full time Exercise  Exercises daily; does running / walking, individual sport and gym / [employer] Former drinker  02/06/2017: In the past drank Living situation  live with spouse Marital status  married No history of drug/alcohol rehab  Not under pain contract  Number of flights of stairs before winded  greater than 5 Tobacco / smoke exposure  02/06/2017: no Tobacco use  Never smoker. 02/06/2017  Medication History  Levothyroxine Sodium (100MCG Tablet, Oral) Active. (qd) Gabapentin (300MG  Capsule, Oral) Active. (qd) Montelukast Sodium (10MG  Tablet, Oral) Active. (qd) Azelastine HCl (0.15% Solution, Nasal) Active. (bid) Medications Reconciled  Other Problems  Cerebrovascular Accident  Anemia  Asthma  Depression  Hypothyroidism  Migraine Headache   Vitals 03/05/2017 9:03 AM Weight: 141 lb Height: 60.75in Body Surface Area: 1.62 m Body Mass Index: 26.86 kg/m  Temp.: 98.69F(Oral)  Pulse: 68 (Regular)  BP: 117/75 (Sitting, Left Arm, Standard)  General General Appearance-Not in acute distress. Orientation-Oriented X3. Build & Nutrition-Well nourished and Well developed.  Integumentary General  Characteristics Surgical Scars - no surgical scar evidence of previous cervical surgery. Cervical Spine-Skin examination of the cervical spine is without deformity, skin lesions, lacerations or abrasions.  Chest and Lung Exam Auscultation Breath sounds - Normal and Clear.  Cardiovascular Auscultation Rhythm - Regular rate and rhythm.  Peripheral Vascular Upper Extremity Palpation - Radial pulse - Bilateral - 2+.  Neurologic Sensation Upper Extremity - Bilateral - sensation is intact in the upper extremity. Reflexes Biceps Reflex - Bilateral - 2+. Brachioradialis Reflex - Bilateral - 2+. Triceps Reflex - Bilateral - 2+. Hoffman's Sign - Bilateral - Hoffman's sign not present.  Musculoskeletal Spine/Ribs/Pelvis  Cervical Spine : Inspection and Palpation - Tenderness - left cervical paraspinals tender to palpation and left trapezius tender to palpation. Strength and Tone: Strength: Strength: Strength - Deltoid - Bilateral - 5/5. Right - 5/5. Biceps - Left - 3+/5. Triceps - Bilateral - 5/5. Wrist Extension - Left - 3+/5. Right - 5/5. Hand Grip - Bilateral - 5/5. Heel walk - Bilateral - able to heel walk without difficulty. Toe Walk - Bilateral - able to walk on toes without difficulty. Heel-Toe Walk - Bilateral - able to heel-toe walk without difficulty. ROM - Flexion - Moderately Decreased. Extension - Moderately Decreased and painful. Left Lateral Flexion - Moderately Decreased. Right Lateral Flexion - Mildly decreased. Left Rotation - Mildly decreased. Right Rotation - Mildly decreased. Pain - extension is more painful than flexion. Cervical Spine - Special Testing - axial compression test negative, cross chest impingement test negative. Non-Anatomic Signs - No non-anatomic signs present. Upper Extremity Range of Motion - No truesholder pain with IR/ER of the shoulders.  Her MRI from 01/26/2017 shows a C5-6 hard disc osteophyte left-sided causing  marked compression and foraminal  narrowing at the C6 neural nerve. There is a reversal of normal cervical lordosis.   Assessment & Plan  Goal Of Surgery: Discussed that goal of surgery is to reduce pain and improve function and quality of life. Patient is aware that despite all appropriate treatment that there pain and function could be the same, worse, or different. Anterior cervical fusion:Risks of surgery include, but are not limited to: Throat pain, swallowing difficulty, hoarseness or change in voice, death, stroke, paralysis, nerve root damage/injury, bleeding, blood clots, loss of bowel/bladder control, hardware failure, or mal-position, spinal fluid leak, adjacent segment disease, non-union, need for further surgery, ongoing or worse pain, infection. Post-operative bleeding or swelling that could require emergent surgery.  The patient has acute neurological deficits, and significant radicular pain. We discussed nonoperative treatment, which would include injection therapy and physical therapy. However given the size of the bone spur, and the motor and sensory deficits that she has I did not feel so the injection would provide long lasting  Relief. The patient then decided to move forward with surgery. The nature all of her husband's questions were addressed and that she had a full understanding of the risks and the goal of surgery.

## 2017-03-09 ENCOUNTER — Encounter (HOSPITAL_COMMUNITY): Payer: Self-pay | Admitting: Orthopedic Surgery

## 2017-03-09 DIAGNOSIS — M4802 Spinal stenosis, cervical region: Secondary | ICD-10-CM | POA: Diagnosis not present

## 2017-03-09 DIAGNOSIS — E039 Hypothyroidism, unspecified: Secondary | ICD-10-CM | POA: Diagnosis not present

## 2017-03-09 DIAGNOSIS — Z8673 Personal history of transient ischemic attack (TIA), and cerebral infarction without residual deficits: Secondary | ICD-10-CM | POA: Diagnosis not present

## 2017-03-09 DIAGNOSIS — M5412 Radiculopathy, cervical region: Secondary | ICD-10-CM | POA: Diagnosis not present

## 2017-03-09 NOTE — Progress Notes (Signed)
OT Evaluation    03/09/17 1200  OT Visit Information  Assistance Needed +1  History of Present Illness Pt is a 36 y/o female who presents s/p C5-C6 ACDF on 03/08/17.  Precautions  Precautions Fall;Cervical  Precaution Comments Reviewed handout with pt, and she was cued for precautions during functional mobility  Required Braces or Orthoses Cervical Brace  Cervical Brace Hard collar;At all times  Restrictions  Weight Bearing Restrictions No  Home Living  Family/patient expects to be discharged to: Private residence  Living Arrangements Spouse/significant other;Children  Available Help at Discharge Family;Available 24 hours/day  Type of Home House  Home Access Stairs to enter  Entrance Stairs-Number of Steps 3  Home Layout One level  Bathroom Field seismologisthower/Tub Walk-in shower  Bathroom Toilet Standard  Bathroom Accessibility No  Home Equipment None  Prior Function  Level of Independence Independent  Communication  Communication No difficulties  Pain Assessment  Faces Pain Scale 2  Pain Location Incision site  Pain Descriptors / Indicators Operative site guarding  Cognition  Arousal/Alertness Awake/alert  Behavior During Therapy WFL for tasks assessed/performed  Overall Cognitive Status Within Functional Limits for tasks assessed  Cervical / Trunk Assessment  Cervical / Trunk Assessment Other exceptions  Cervical / Trunk Exceptions s/p surgery  ADL  Overall ADL's  Needs assistance/impaired  General ADL Comments Pt overall set up for ADL. Educated pt regarding cervical precautions and compensatory technqieus for self care. Pt concerned about pericare and managing menstrul time. discussed options. Pt verbalized understanding. Also educaiton on home set up to maximize independence. Pt educated on donning/doffing cervcal brace adn able to return demonstrate.   Bed Mobility  Overal bed mobility Modified Independent  Transfers  Overall transfer level Modified independent  Equipment used  None  Balance  Overall balance assessment Modified Independent  OT - End of Session  Activity Tolerance Patient tolerated treatment well  Patient left in bed;with call bell/phone within reach  Nurse Communication Mobility status  OT Assessment  OT Recommendation/Assessment Patient does not need any further OT services  OT Visit Diagnosis Pain  Pain - part of body (neck)  OT Problem List Decreased activity tolerance;Decreased knowledge of use of DME or AE;Decreased knowledge of precautions;Pain  AM-PAC OT "6 Clicks" Daily Activity Outcome Measure  Help from another person eating meals? 4  Help from another person taking care of personal grooming? 4  Help from another person toileting, which includes using toliet, bedpan, or urinal? 3  Help from another person bathing (including washing, rinsing, drying)? 3  Help from another person to put on and taking off regular upper body clothing? 4  Help from another person to put on and taking off regular lower body clothing? 4  6 Click Score 22  ADL G Code Conversion CJ  OT Recommendation  Follow Up Recommendations No OT follow up;Supervision - Intermittent  OT Equipment None recommended by OT  Acute Rehab OT Goals  Patient Stated Goal Independent  OT Goal Formulation All assessment and education complete, DC therapy  OT G-codes **NOT FOR INPATIENT CLASS**  Functional Assessment Tool Used Clinical judgement  Functional Limitation Self care  Self Care Current Status (Z6109(G8987) CI  Self Care Goal Status (U0454(G8988) CI  Self Care Discharge Status (U9811(G8989) CI  OT General Charges  $OT Visit 1 Procedure  OT Evaluation  $OT Eval Low Complexity 1 Procedure  [facility] An[facility]ola Kinsel, OT/L  [phone] 03/09/2017

## 2017-03-09 NOTE — Progress Notes (Signed)
    Subjective: 1 Day Post-Op Procedure(s) (LRB): ANTERIOR CERVICAL DECOMPRESSION/DISCECTOMY FUSION C5-6 (N/A) Patient reports pain as 2 on 0-10 scale.   Denies CP or SOB.  Voiding without difficulty. Positive flatus.Decreased arm pain Objective: Vital signs in last 24 hours: Temp:  [97.6 F (36.4 C)-98.4 F (36.9 C)] 98.4 F (36.9 C) (08/03 0355) Pulse Rate:  [57-105] 82 (08/03 0355) Resp:  [14-18] 16 (08/03 0355) BP: (92-110)/(53-68) 109/62 (08/03 0355) SpO2:  [96 %-100 %] 98 % (08/03 0355) Weight:  [63.5 kg (140 lb)] 63.5 kg (140 lb) (08/03 0500)  Intake/Output from previous day: 08/02 0701 - 08/03 0700 In: 1980 [P.O.:480; I.V.:1500] Out: 30 [Blood:30] Intake/Output this shift: No intake/output data recorded.  Labs:  Recent Labs  03/06/17 1150  HGB 13.1    Recent Labs  03/06/17 1150  WBC 6.4  RBC 4.50  HCT 39.4  PLT 248    Recent Labs  03/06/17 1150  NA 138  K 4.4  CL 104  CO2 27  BUN 11  CREATININE 0.74  GLUCOSE 92  CALCIUM 9.5   No results for input(s): LABPT, INR in the last 72 hours.  Physical Exam: Neurologically intact ABD soft Sensation intact distally Dorsiflexion/Plantar flexion intact Incision: scant drainage Compartment soft  Assessment/Plan:1 Day Post-Op Procedure(s) (LRB): ANTERIOR CERVICAL DECOMPRESSION/DISCECTOMY FUSION C5-6 (N/A) Advance diet Up with therapy  Pt will continue to wear the Aspen collar when not in the bed Post op meds provided Pt may DC today after cleared by PT  [facility], Baxter Kailarmen Christina for Dr. Venita Lickahari Brooks [facility] [phone] 03/09/2017, 7:09 AM    Patient ID: Teresa Guzman, female   DOB: 02/17/1981, 36 y.o.   MRN: [id number]

## 2017-03-09 NOTE — Progress Notes (Signed)
Physical Therapy Evaluation Addendum for G-Codes    03/08/17 1254  PT G-Codes **NOT FOR INPATIENT CLASS**  Functional Assessment Tool Used Clinical judgement  Functional Limitation Mobility: Walking and moving around  Mobility: Walking and Moving Around Current Status (R6045(G8978) CI  Mobility: Walking and Moving Around Goal Status [phone](G8979) CI  Mobility: Walking and Moving Around Discharge Status [phone](G8980) CI   Conni SlipperLaura Roseland Braun, PT, DPT Acute Rehabilitation Services Pager: [phone]

## 2017-03-09 NOTE — Progress Notes (Signed)
Patient is discharged from room 3C07 at this time. Alert and in stable condition. IV site d/c'd and instructions read to patient with understanding verbalized. Left unit via wheelchair with all belongings at side. 

## 2017-03-13 ENCOUNTER — Telehealth: Payer: Self-pay | Admitting: Family Medicine

## 2017-03-13 NOTE — Telephone Encounter (Signed)
Patient is having a scan for left diagnostic mammo and U/S done tomorrow and they need an order. Please advise

## 2017-03-13 NOTE — Telephone Encounter (Signed)
This is a [facility] facility and they cannot see any order in EPIC.  They are faxing over paperwork to be signed and faxed back

## 2017-03-14 DIAGNOSIS — R922 Inconclusive mammogram: Secondary | ICD-10-CM | POA: Diagnosis not present

## 2017-03-14 DIAGNOSIS — R928 Other abnormal and inconclusive findings on diagnostic imaging of breast: Secondary | ICD-10-CM | POA: Diagnosis not present

## 2017-03-14 DIAGNOSIS — N6002 Solitary cyst of left breast: Secondary | ICD-10-CM | POA: Diagnosis not present

## 2017-03-14 LAB — HM MAMMOGRAPHY

## 2017-03-26 ENCOUNTER — Encounter: Payer: Self-pay | Admitting: General Practice

## 2017-03-27 ENCOUNTER — Encounter: Payer: Self-pay | Admitting: Family Medicine

## 2017-04-04 ENCOUNTER — Other Ambulatory Visit: Payer: Self-pay | Admitting: Family Medicine

## 2017-04-20 DIAGNOSIS — M50122 Cervical disc disorder at C5-C6 level with radiculopathy: Secondary | ICD-10-CM | POA: Diagnosis not present

## 2017-04-27 ENCOUNTER — Other Ambulatory Visit: Payer: Self-pay | Admitting: Neurology

## 2017-05-14 ENCOUNTER — Encounter: Payer: Self-pay | Admitting: Gastroenterology

## 2017-05-16 ENCOUNTER — Ambulatory Visit: Payer: BLUE CROSS/BLUE SHIELD | Admitting: Neurology

## 2017-05-23 ENCOUNTER — Other Ambulatory Visit: Payer: Self-pay | Admitting: Family Medicine

## 2017-06-01 DIAGNOSIS — M50122 Cervical disc disorder at C5-C6 level with radiculopathy: Secondary | ICD-10-CM | POA: Diagnosis not present

## 2017-06-07 HISTORY — PX: COLONOSCOPY: SHX174

## 2017-07-04 ENCOUNTER — Other Ambulatory Visit: Payer: Self-pay

## 2017-07-04 ENCOUNTER — Ambulatory Visit (AMBULATORY_SURGERY_CENTER): Payer: Self-pay | Admitting: *Deleted

## 2017-07-04 VITALS — Ht 60.0 in | Wt 148.0 lb

## 2017-07-04 DIAGNOSIS — Z800 Family history of malignant neoplasm of digestive organs: Secondary | ICD-10-CM

## 2017-07-04 MED ORDER — NA SULFATE-K SULFATE-MG SULF 17.5-3.13-1.6 GM/177ML PO SOLN: ORAL | 0 refills | Status: DC

## 2017-07-04 NOTE — Progress Notes (Signed)
Patient denies any allergies to eggs or soy. Patient denies any problems with anesthesia/sedation. Patient denies any oxygen use at home. Patient denies taking any diet/weight loss medications or blood thinners. EMMI education assisgned to patient on colonoscopy, this was explained and instructions given to patient. 

## 2017-07-08 ENCOUNTER — Other Ambulatory Visit: Payer: Self-pay | Admitting: Family Medicine

## 2017-07-11 ENCOUNTER — Telehealth: Payer: Self-pay | Admitting: Gastroenterology

## 2017-07-11 NOTE — Telephone Encounter (Signed)
Returned phone call to patient regarding prep. She states the prep will cost 170.00 and she cannot afford that. Will change instructions to [employer] and mail to patient tomorrow.

## 2017-07-12 ENCOUNTER — Encounter: Payer: Self-pay | Admitting: Gastroenterology

## 2017-07-12 NOTE — Telephone Encounter (Signed)
Spoke with patient by phone and she is agreeable to use moviprep. Husband will pick up today from office and I will review written instructions provided with her by phone tomorrow.

## 2017-07-12 NOTE — Telephone Encounter (Signed)
Moviprep sample available and will be given to patient if she is available to come and pick it up.

## 2017-07-18 ENCOUNTER — Other Ambulatory Visit: Payer: Self-pay | Admitting: Family Medicine

## 2017-07-18 ENCOUNTER — Ambulatory Visit (AMBULATORY_SURGERY_CENTER): Payer: BLUE CROSS/BLUE SHIELD | Admitting: Gastroenterology

## 2017-07-18 ENCOUNTER — Encounter: Payer: Self-pay | Admitting: Gastroenterology

## 2017-07-18 ENCOUNTER — Other Ambulatory Visit: Payer: Self-pay

## 2017-07-18 VITALS — BP 124/61 | HR 88 | Temp 98.0°F | Resp 21 | Ht 60.0 in | Wt 148.0 lb

## 2017-07-18 DIAGNOSIS — Z1211 Encounter for screening for malignant neoplasm of colon: Secondary | ICD-10-CM | POA: Diagnosis not present

## 2017-07-18 DIAGNOSIS — Z800 Family history of malignant neoplasm of digestive organs: Secondary | ICD-10-CM | POA: Diagnosis not present

## 2017-07-18 MED ORDER — SODIUM CHLORIDE 0.9 % IV SOLN: 500.0000 mL | Freq: Once | INTRAVENOUS | Status: DC

## 2017-07-18 NOTE — Patient Instructions (Signed)
YOU HAD AN ENDOSCOPIC PROCEDURE TODAY AT THE [facility]:   Refer to the procedure report that was given to you for any specific questions about what was found during the examination.  If the procedure report does not answer your questions, please call your gastroenterologist to clarify.  If you requested that your care partner not be given the details of your procedure findings, then the procedure report has been included in a sealed envelope for you to review at your convenience later.  YOU SHOULD EXPECT: Some feelings of bloating in the abdomen. Passage of more gas than usual.  Walking can help get rid of the air that was put into your GI tract during the procedure and reduce the bloating. If you had a lower endoscopy (such as a colonoscopy or flexible sigmoidoscopy) you may notice spotting of blood in your stool or on the toilet paper. If you underwent a bowel prep for your procedure, you may not have a normal bowel movement for a few days.  Please Note:  You might notice some irritation and congestion in your nose or some drainage.  This is from the oxygen used during your procedure.  There is no need for concern and it should clear up in a day or so.  SYMPTOMS TO REPORT IMMEDIATELY:   Following lower endoscopy (colonoscopy or flexible sigmoidoscopy):  Excessive amounts of blood in the stool  Significant tenderness or worsening of abdominal pains  Swelling of the abdomen that is new, acute  Fever of 100F or higher  For urgent or emergent issues, a gastroenterologist can be reached at any hour by calling [phone].   DIET:  We do recommend a small meal at first, but then you may proceed to your regular diet.  Drink plenty of fluids but you should avoid alcoholic beverages for 24 hours.  MEDICATIONS:  Continue present medications.  ACTIVITY:  You should plan to take it easy for the rest of today and you should NOT DRIVE or use heavy machinery until tomorrow (because of the  sedation medicines used during the test).    FOLLOW UP: Our staff will call the number listed on your records the next business day following your procedure to check on you and address any questions or concerns that you may have regarding the information given to you following your procedure. If we do not reach you, we will leave a message.  However, if you are feeling well and you are not experiencing any problems, there is no need to return our call.  We will assume that you have returned to your regular daily activities without incident.  If any biopsies were taken you will be contacted by phone or by letter within the next 1-3 weeks.  Please call us at [phone] if you have not heard about the biopsies in 3 weeks.   Thank you for allowing us to provide for your healthcare needs today.   SIGNATURES/CONFIDENTIALITY: You and/or your care partner have signed paperwork which will be entered into your electronic medical record.  These signatures attest to the fact that that the information above on your After Visit Summary has been reviewed and is understood.  Full responsibility of the confidentiality of this discharge information lies with you and/or your care-partner. 

## 2017-07-18 NOTE — Progress Notes (Signed)
Pt's states no medical or surgical changes since previsit or office visit. 

## 2017-07-18 NOTE — Op Note (Signed)
[facility] Patient Name: Teresa LemmingsDawn Guzman Procedure Date: 07/18/2017 11:16 AM MRN: [id number] Endoscopist: Rachael Feeaniel P Jacobs , MD Age: [age] Referring MD:  Date of Birth: 12/26/1980 Gender: Female Account #: [id number] Procedure:                Colonoscopy Indications:              Screening in patient at increased risk: Family                            history of 1st-degree relative with colorectal                            cancer before age 38 years (brother diagnosed with                            colon cancer in his 30s) Medicines:                Monitored Anesthesia Care Procedure:                Pre-Anesthesia Assessment:                           - Prior to the procedure, a History and Physical                            was performed, and patient medications and                            allergies were reviewed. The patient's tolerance of                            previous anesthesia was also reviewed. The risks                            and benefits of the procedure and the sedation                            options and risks were discussed with the patient.                            All questions were answered, and informed consent                            was obtained. Prior Anticoagulants: The patient has                            taken no previous anticoagulant or antiplatelet                            agents. ASA Grade Assessment: II - A patient with                            mild systemic disease. After reviewing the risks  and benefits, the patient was deemed in                            satisfactory condition to undergo the procedure.                           After obtaining informed consent, the colonoscope                            was passed under direct vision. Throughout the                            procedure, the patient's blood pressure, pulse, and                            oxygen saturations were monitored  continuously. The                            Model PCF-[id number](SN#[id number]) scope was introduced                            through the anus and advanced to the the cecum,                            identified by appendiceal orifice and ileocecal                            valve. The colonoscopy was performed without                            difficulty. The patient tolerated the procedure                            well. The quality of the bowel preparation was                            good. The ileocecal valve, appendiceal orifice, and                            rectum were photographed. Scope In: 11:25:13 AM Scope Out: 11:34:08 AM Scope Withdrawal Time: 0 hours 7 minutes 3 seconds  Total Procedure Duration: 0 hours 8 minutes 55 seconds  Findings:                 The entire examined colon appeared normal on direct                            and retroflexion views. Complications:            No immediate complications. Estimated Blood Loss:     Estimated blood loss: none. Impression:               - The entire examined colon is normal on direct and  retroflexion views.                           - No polyps or cancers. Recommendation:           - Patient has a contact number available for                            emergencies. The signs and symptoms of potential                            delayed complications were discussed with the                            patient. Return to normal activities tomorrow.                            Written discharge instructions were provided to the                            patient.                           - Resume previous diet.                           - Continue present medications.                           - Repeat colonoscopy in 5 years for screening. Rachael Fee, MD 07/18/2017 11:36:54 AM This report has been signed electronically.

## 2017-07-18 NOTE — Progress Notes (Signed)
Report given to PACU, vss 

## 2017-07-19 ENCOUNTER — Telehealth: Payer: Self-pay

## 2017-07-19 NOTE — Telephone Encounter (Signed)
  Follow up Call-  Call back number 07/18/2017  Post procedure Call Back phone  # [phone]  Permission to leave phone message Yes  Some recent data might be hidden     Patient questions:  Do you have a fever, pain , or abdominal swelling? No. Pain Score  0 *  Have you tolerated food without any problems? Yes.    Have you been able to return to your normal activities? Yes.    Do you have any questions about your discharge instructions: Diet   No. Medications  No. Follow up visit  No.  Do you have questions or concerns about your Care? No.  Actions: * If pain score is 4 or above: No action needed, pain <4.

## 2017-07-25 ENCOUNTER — Encounter: Payer: BLUE CROSS/BLUE SHIELD | Admitting: Family Medicine

## 2017-08-22 ENCOUNTER — Other Ambulatory Visit: Payer: Self-pay | Admitting: Neurology

## 2017-08-24 ENCOUNTER — Other Ambulatory Visit: Payer: Self-pay | Admitting: Family Medicine

## 2017-09-20 ENCOUNTER — Other Ambulatory Visit: Payer: Self-pay | Admitting: Family Medicine

## 2017-09-20 ENCOUNTER — Other Ambulatory Visit: Payer: Self-pay | Admitting: Neurology

## 2017-09-28 ENCOUNTER — Ambulatory Visit (INDEPENDENT_AMBULATORY_CARE_PROVIDER_SITE_OTHER): Payer: BLUE CROSS/BLUE SHIELD | Admitting: Family Medicine

## 2017-09-28 ENCOUNTER — Other Ambulatory Visit: Payer: Self-pay

## 2017-09-28 ENCOUNTER — Encounter: Payer: Self-pay | Admitting: Family Medicine

## 2017-09-28 VITALS — BP 122/70 | HR 76 | Temp 98.1°F | Resp 17 | Ht 60.0 in | Wt 144.1 lb

## 2017-09-28 DIAGNOSIS — N920 Excessive and frequent menstruation with regular cycle: Secondary | ICD-10-CM

## 2017-09-28 DIAGNOSIS — E559 Vitamin D deficiency, unspecified: Secondary | ICD-10-CM

## 2017-09-28 DIAGNOSIS — Z0000 Encounter for general adult medical examination without abnormal findings: Secondary | ICD-10-CM | POA: Diagnosis not present

## 2017-09-28 DIAGNOSIS — E039 Hypothyroidism, unspecified: Secondary | ICD-10-CM | POA: Diagnosis not present

## 2017-09-28 LAB — LIPID PANEL
Cholesterol: 165 mg/dL (ref 0–200)
HDL: 53.9 mg/dL (ref >39.00)
LDL Cholesterol: 79 mg/dL (ref 0–99)
NonHDL: 110.84
Total CHOL/HDL Ratio: 3
Triglycerides: 157 mg/dL — ABNORMAL HIGH (ref 0.0–149.0)
VLDL: 31.4 mg/dL (ref 0.0–40.0)

## 2017-09-28 LAB — BASIC METABOLIC PANEL
BUN: 13 mg/dL (ref 6–23)
CO2: 30 mEq/L (ref 19–32)
Calcium: 9.5 mg/dL (ref 8.4–10.5)
Chloride: 104 mEq/L (ref 96–112)
Creatinine, Ser: 0.81 mg/dL (ref 0.40–1.20)
GFR: 83.97 mL/min (ref >60.00)
Glucose, Bld: 85 mg/dL (ref 70–99)
Potassium: 4.3 mEq/L (ref 3.5–5.1)
Sodium: 141 mEq/L (ref 135–145)

## 2017-09-28 LAB — HEPATIC FUNCTION PANEL
ALT: 8 U/L (ref 0–35)
AST: 17 U/L (ref 0–37)
Albumin: 4.2 g/dL (ref 3.5–5.2)
Alkaline Phosphatase: 64 U/L (ref 39–117)
Bilirubin, Direct: 0.1 mg/dL (ref 0.0–0.3)
Total Bilirubin: 0.3 mg/dL (ref 0.2–1.2)
Total Protein: 6.8 g/dL (ref 6.0–8.3)

## 2017-09-28 LAB — CBC WITH DIFFERENTIAL/PLATELET
Basophils Absolute: 0.1 10*3/uL (ref 0.0–0.1)
Basophils Relative: 1 % (ref 0.0–3.0)
Eosinophils Absolute: 0.1 10*3/uL (ref 0.0–0.7)
Eosinophils Relative: 1.8 % (ref 0.0–5.0)
HCT: 36.8 % (ref 36.0–46.0)
Hemoglobin: 12.2 g/dL (ref 12.0–15.0)
Lymphocytes Relative: 38.1 % (ref 12.0–46.0)
Lymphs Abs: 2.4 10*3/uL (ref 0.7–4.0)
MCHC: 33.1 g/dL (ref 30.0–36.0)
MCV: 91.7 fl (ref 78.0–100.0)
Monocytes Absolute: 0.6 10*3/uL (ref 0.1–1.0)
Monocytes Relative: 8.6 % (ref 3.0–12.0)
Neutro Abs: 3.2 10*3/uL (ref 1.4–7.7)
Neutrophils Relative %: 50.5 % (ref 43.0–77.0)
Platelets: 289 10*3/uL (ref 150.0–400.0)
RBC: 4.01 Mil/uL (ref 3.87–5.11)
RDW: 15.3 % (ref 11.5–15.5)
WBC: 6.4 10*3/uL (ref 4.0–10.5)

## 2017-09-28 LAB — VITAMIN D 25 HYDROXY (VIT D DEFICIENCY, FRACTURES): VITD: 23.31 ng/mL — ABNORMAL LOW (ref 30.00–100.00)

## 2017-09-28 LAB — TSH: TSH: 3.55 u[IU]/mL (ref 0.35–4.50)

## 2017-09-28 NOTE — Progress Notes (Signed)
   Subjective:    Patient ID: Teresa GrinderDawn T Tidd, female    DOB: 06/18/1981, 37 y.o.   MRN: [id number]  HPI CPE- UTD on mammo, colonoscopy.  Declines flu shot.  UTD on Tdap.     Review of Systems Patient reports no vision/ hearing changes, adenopathy,fever, weight change,  persistant/recurrent hoarseness , swallowing issues, chest pain, palpitations, edema, persistant/recurrent cough, hemoptysis, dyspnea (rest/exertional/paroxysmal nocturnal), gastrointestinal bleeding (melena, rectal bleeding), abdominal pain, significant heartburn, bowel changes, GU symptoms (dysuria, hematuria, incontinence),  syncope, focal weakness, memory loss, numbness & tingling, skin/hair/nail changes, abnormal bruising or bleeding, anxiety, or depression.   Menorrhagia- pt reports very heavy bleeding and severe cramping during menstrual cycles.  Interested in possible ablation.      Objective:   Physical Exam General Appearance:    Alert, cooperative, no distress, appears stated age  Head:    Normocephalic, without obvious abnormality, atraumatic  Eyes:    PERRL, conjunctiva/corneas clear, EOM's intact, fundi    benign, both eyes  Ears:    Normal TM's and external ear canals, both ears  Nose:   Nares normal, septum midline, mucosa normal, no drainage    or sinus tenderness  Throat:   Lips, mucosa, and tongue normal; teeth and gums normal  Neck:   Supple, symmetrical, trachea midline, no adenopathy;    Thyroid: no enlargement/tenderness/nodules  Back:     Symmetric, no curvature, ROM normal, no CVA tenderness  Lungs:     Clear to auscultation bilaterally, respirations unlabored  Chest Wall:    No tenderness or deformity   Heart:    Regular rate and rhythm, S1 and S2 normal, no murmur, rub   or gallop  Breast Exam:    Deferred to mammo  Abdomen:     Soft, non-tender, bowel sounds active all four quadrants,    no masses, no organomegaly  Genitalia:    Deferred to GYN  Rectal:    Extremities:   Extremities normal,  atraumatic, no cyanosis or edema  Pulses:   2+ and symmetric all extremities  Skin:   Skin color, texture, turgor normal, no rashes or lesions  Lymph nodes:   Cervical, supraclavicular, and axillary nodes normal  Neurologic:   CNII-XII intact, normal strength, sensation and reflexes    throughout          Assessment & Plan:

## 2017-09-28 NOTE — Patient Instructions (Signed)
Follow up in 1 year or as needed We'll notify you of your lab results and make any changes if needed Keep up the good work on healthy diet and regular exercise- you look great! Call and schedule your mammogram Call with any questions or concerns Have a great weekend!!!

## 2017-09-28 NOTE — Assessment & Plan Note (Signed)
Pt's PE WNL.  Pt to call and schedule mammo.  We'll do pap at next visit.  UTD on Tdap.  Declines flu.  Check labs.  Anticipatory guidance provided.

## 2017-09-28 NOTE — Assessment & Plan Note (Signed)
Chronic problem.  Check labs.  Adjust meds prn  

## 2017-09-28 NOTE — Assessment & Plan Note (Signed)
Pt has hx of this.  Check labs.  Replete prn. 

## 2017-09-30 DIAGNOSIS — K8050 Calculus of bile duct without cholangitis or cholecystitis without obstruction: Secondary | ICD-10-CM | POA: Diagnosis not present

## 2017-09-30 DIAGNOSIS — R140 Abdominal distension (gaseous): Secondary | ICD-10-CM | POA: Diagnosis not present

## 2017-09-30 DIAGNOSIS — R1030 Lower abdominal pain, unspecified: Secondary | ICD-10-CM | POA: Diagnosis not present

## 2017-09-30 DIAGNOSIS — K8020 Calculus of gallbladder without cholecystitis without obstruction: Secondary | ICD-10-CM | POA: Diagnosis not present

## 2017-09-30 DIAGNOSIS — R112 Nausea with vomiting, unspecified: Secondary | ICD-10-CM | POA: Diagnosis not present

## 2017-09-30 DIAGNOSIS — R61 Generalized hyperhidrosis: Secondary | ICD-10-CM | POA: Diagnosis not present

## 2017-10-01 MED ORDER — VITAMIN D (ERGOCALCIFEROL) 1.25 MG (50000 UNIT) PO CAPS: 50000.0000 [IU] | ORAL_CAPSULE | ORAL | 0 refills | Status: DC

## 2017-10-01 NOTE — Addendum Note (Signed)
Addended by: Geannie RisenBRODMERKEL, JESSICA L on: 10/01/2017 10:51 AM   Modules accepted: Orders

## 2017-10-02 DIAGNOSIS — K8000 Calculus of gallbladder with acute cholecystitis without obstruction: Secondary | ICD-10-CM | POA: Diagnosis not present

## 2017-10-04 DIAGNOSIS — K8000 Calculus of gallbladder with acute cholecystitis without obstruction: Secondary | ICD-10-CM | POA: Diagnosis not present

## 2017-10-04 DIAGNOSIS — K8010 Calculus of gallbladder with chronic cholecystitis without obstruction: Secondary | ICD-10-CM | POA: Diagnosis not present

## 2017-10-05 HISTORY — PX: CHOLECYSTECTOMY: SHX55

## 2017-10-19 ENCOUNTER — Telehealth: Payer: Self-pay | Admitting: General Practice

## 2017-10-19 DIAGNOSIS — N63 Unspecified lump in unspecified breast: Secondary | ICD-10-CM

## 2017-10-19 NOTE — Telephone Encounter (Signed)
Ok to send in US order?   Copied from CRM [phone]#[id number]. Topic: Inquiry >> Oct 19, 2017 11:32 AM Alexander BergeronBarksdale, Teresa Guzman: Reason for CRM: [facility] Left Limited Ultrasound faxed to [phone] or call [phone]

## 2017-10-19 NOTE — Telephone Encounter (Signed)
Ok for order?  

## 2017-10-19 NOTE — Telephone Encounter (Signed)
Orders were placed. Please fax as requested.

## 2017-10-19 NOTE — Telephone Encounter (Signed)
[facility]

## 2017-11-06 DIAGNOSIS — N920 Excessive and frequent menstruation with regular cycle: Secondary | ICD-10-CM | POA: Diagnosis not present

## 2017-11-06 DIAGNOSIS — N945 Secondary dysmenorrhea: Secondary | ICD-10-CM | POA: Diagnosis not present

## 2017-11-06 DIAGNOSIS — Z6826 Body mass index (BMI) 26.0-26.9, adult: Secondary | ICD-10-CM | POA: Diagnosis not present

## 2017-11-14 DIAGNOSIS — N920 Excessive and frequent menstruation with regular cycle: Secondary | ICD-10-CM | POA: Diagnosis not present

## 2017-11-19 ENCOUNTER — Other Ambulatory Visit: Payer: Self-pay | Admitting: Family Medicine

## 2017-11-23 NOTE — Patient Instructions (Addendum)
Your procedure is scheduled on:  Thursday, May 9  Enter through the [facility] Entrance of [facility] at: 11 am  Pick up the phone at the desk and dial [phone].  Call this number if you have problems the morning of surgery: [phone].  Remember: Do NOT eat food or Do NOT drink clear liquids (including water) after midnight Wednesday.  Take these medicines the morning of surgery with a SIP OF WATER:  Synthroid.  May use azelastine nasal and flonase nasal spray if needed.  Stop herbal medications, vitamin supplements and ibuprofen 1 week prior to surgery.  Do NOT wear jewelry (body piercing), metal hair clips/bobby pins, make-up, or nail polish. Do NOT wear lotions, powders, or perfumes.  You may wear deoderant. Do NOT shave for 48 hours prior to surgery. Do NOT bring valuables to the hospital.  Have a responsible adult drive you home and stay with you for 24 hours after your procedure.  Home with husband Rob cell [phone].

## 2017-11-27 ENCOUNTER — Other Ambulatory Visit: Payer: Self-pay | Admitting: Obstetrics & Gynecology

## 2017-12-03 ENCOUNTER — Encounter (HOSPITAL_COMMUNITY): Payer: Self-pay

## 2017-12-03 ENCOUNTER — Other Ambulatory Visit: Payer: Self-pay

## 2017-12-03 ENCOUNTER — Encounter (HOSPITAL_COMMUNITY)
Admission: RE | Admit: 2017-12-03 | Discharge: 2017-12-03 | Disposition: A | Discharge status: Home / Self Care | Payer: BLUE CROSS/BLUE SHIELD | Source: Ambulatory Visit | Attending: Obstetrics & Gynecology | Admitting: Obstetrics & Gynecology

## 2017-12-03 DIAGNOSIS — Z01812 Encounter for preprocedural laboratory examination: Secondary | ICD-10-CM | POA: Insufficient documentation

## 2017-12-03 HISTORY — DX: Major depressive disorder, single episode, unspecified: F32.9

## 2017-12-03 HISTORY — DX: Other seasonal allergic rhinitis: J30.2

## 2017-12-03 HISTORY — DX: Female infertility, unspecified: N97.9

## 2017-12-03 HISTORY — DX: Bipolar disorder, unspecified: F31.9

## 2017-12-03 HISTORY — DX: Depression, unspecified: F32.A

## 2017-12-03 LAB — BASIC METABOLIC PANEL
Anion gap: 7 (ref 5–15)
BUN: 11 mg/dL (ref 6–20)
CO2: 29 mmol/L (ref 22–32)
Calcium: 9.9 mg/dL (ref 8.9–10.3)
Chloride: 100 mmol/L — ABNORMAL LOW (ref 101–111)
Creatinine, Ser: 0.81 mg/dL (ref 0.44–1.00)
GFR calc Af Amer: >60 mL/min (ref >60)
GFR calc non Af Amer: >60 mL/min (ref >60)
Glucose, Bld: 96 mg/dL (ref 65–99)
Potassium: 4.9 mmol/L (ref 3.5–5.1)
Sodium: 136 mmol/L (ref 135–145)

## 2017-12-03 LAB — CBC
HCT: 38.4 % (ref 36.0–46.0)
Hemoglobin: 12.8 g/dL (ref 12.0–15.0)
MCH: 30.3 pg (ref 26.0–34.0)
MCHC: 33.3 g/dL (ref 30.0–36.0)
MCV: 91 fL (ref 78.0–100.0)
Platelets: 315 10*3/uL (ref 150–400)
RBC: 4.22 MIL/uL (ref 3.87–5.11)
RDW: 13.6 % (ref 11.5–15.5)
WBC: 9.5 10*3/uL (ref 4.0–10.5)

## 2017-12-03 LAB — TYPE AND SCREEN
ABO/RH(D): A POS
Antibody Screen: NEGATIVE

## 2017-12-03 LAB — ABO/RH: ABO/RH(D): A POS

## 2017-12-03 NOTE — Pre-Procedure Instructions (Signed)
Reviewed medical history and EKG with Dr. Richardson Landry.  Patient had a stroke in 09/2015.  Right vertebral dissection, unknown why this happened.  Dr. Everlena Cooper (Neurologist) office notes of 05/03/16 states CTA of Neck revealed focal dissection had resolved.  Patient does not take medications except for synthroid and nasal sprays.  No deficits.  Patient does cross fit five days a week with no problems.  Patient has had several surgeries since stroke.  10/2017 Cholecystectomy at [facility] and 03/2017 Back fusion C5-C6 at [facility].  No orders given.  Ok for surgery.

## 2017-12-04 ENCOUNTER — Other Ambulatory Visit: Payer: Self-pay | Admitting: Family Medicine

## 2017-12-11 ENCOUNTER — Other Ambulatory Visit: Payer: Self-pay | Admitting: Family Medicine

## 2017-12-13 ENCOUNTER — Ambulatory Visit (HOSPITAL_COMMUNITY)
Admission: RE | Admit: 2017-12-13 | Discharge: 2017-12-13 | Disposition: A | Discharge status: Home / Self Care | Payer: BLUE CROSS/BLUE SHIELD | Source: Ambulatory Visit | Attending: Obstetrics & Gynecology | Admitting: Obstetrics & Gynecology

## 2017-12-13 ENCOUNTER — Encounter (HOSPITAL_COMMUNITY): Payer: Self-pay

## 2017-12-13 ENCOUNTER — Other Ambulatory Visit: Payer: Self-pay

## 2017-12-13 ENCOUNTER — Ambulatory Visit (HOSPITAL_COMMUNITY): Payer: BLUE CROSS/BLUE SHIELD | Admitting: Certified Registered Nurse Anesthetist

## 2017-12-13 ENCOUNTER — Encounter (HOSPITAL_COMMUNITY)
Admission: RE | Disposition: A | Discharge status: Home / Self Care | Payer: Self-pay | Source: Ambulatory Visit | Attending: Obstetrics & Gynecology

## 2017-12-13 DIAGNOSIS — N920 Excessive and frequent menstruation with regular cycle: Secondary | ICD-10-CM | POA: Diagnosis not present

## 2017-12-13 DIAGNOSIS — Z79899 Other long term (current) drug therapy: Secondary | ICD-10-CM | POA: Diagnosis not present

## 2017-12-13 DIAGNOSIS — F319 Bipolar disorder, unspecified: Secondary | ICD-10-CM | POA: Diagnosis not present

## 2017-12-13 DIAGNOSIS — K668 Other specified disorders of peritoneum: Secondary | ICD-10-CM | POA: Diagnosis not present

## 2017-12-13 DIAGNOSIS — N801 Endometriosis of ovary: Secondary | ICD-10-CM | POA: Diagnosis not present

## 2017-12-13 DIAGNOSIS — Z8673 Personal history of transient ischemic attack (TIA), and cerebral infarction without residual deficits: Secondary | ICD-10-CM | POA: Diagnosis not present

## 2017-12-13 DIAGNOSIS — E039 Hypothyroidism, unspecified: Secondary | ICD-10-CM | POA: Insufficient documentation

## 2017-12-13 DIAGNOSIS — N809 Endometriosis, unspecified: Secondary | ICD-10-CM | POA: Diagnosis not present

## 2017-12-13 DIAGNOSIS — N83201 Unspecified ovarian cyst, right side: Secondary | ICD-10-CM | POA: Insufficient documentation

## 2017-12-13 DIAGNOSIS — N808 Other endometriosis: Secondary | ICD-10-CM | POA: Diagnosis not present

## 2017-12-13 DIAGNOSIS — D649 Anemia, unspecified: Secondary | ICD-10-CM | POA: Diagnosis not present

## 2017-12-13 DIAGNOSIS — N83202 Unspecified ovarian cyst, left side: Secondary | ICD-10-CM | POA: Insufficient documentation

## 2017-12-13 DIAGNOSIS — N803 Endometriosis of pelvic peritoneum: Secondary | ICD-10-CM | POA: Insufficient documentation

## 2017-12-13 DIAGNOSIS — R102 Pelvic and perineal pain: Secondary | ICD-10-CM | POA: Diagnosis present

## 2017-12-13 DIAGNOSIS — E559 Vitamin D deficiency, unspecified: Secondary | ICD-10-CM | POA: Diagnosis not present

## 2017-12-13 DIAGNOSIS — J45909 Unspecified asthma, uncomplicated: Secondary | ICD-10-CM | POA: Diagnosis not present

## 2017-12-13 HISTORY — PX: DILITATION & CURRETTAGE/HYSTROSCOPY WITH NOVASURE ABLATION: SHX5568

## 2017-12-13 HISTORY — PX: LAPAROSCOPIC OVARIAN CYSTECTOMY: SHX6248

## 2017-12-13 LAB — TYPE AND SCREEN
ABO/RH(D): A POS
Antibody Screen: NEGATIVE

## 2017-12-13 LAB — PREGNANCY, URINE: Preg Test, Ur: NEGATIVE

## 2017-12-13 SURGERY — EXCISION, CYST, OVARY, LAPAROSCOPIC
Anesthesia: General | Site: Vagina

## 2017-12-13 MED ORDER — ACETAMINOPHEN 500 MG PO TABS
1000.0000 mg | ORAL_TABLET | Freq: Once | ORAL | Status: AC
  Administered 2017-12-13: 1000 mg via ORAL

## 2017-12-13 MED ORDER — DEXAMETHASONE SODIUM PHOSPHATE 4 MG/ML IJ SOLN
INTRAMUSCULAR | Status: AC
  Filled 2017-12-13: qty 1

## 2017-12-13 MED ORDER — ACETAMINOPHEN 500 MG PO TABS
ORAL_TABLET | ORAL | Status: AC
  Administered 2017-12-13: 1000 mg via ORAL
  Filled 2017-12-13: qty 2

## 2017-12-13 MED ORDER — MIDAZOLAM HCL 2 MG/2ML IJ SOLN
INTRAMUSCULAR | Status: AC
  Filled 2017-12-13: qty 2

## 2017-12-13 MED ORDER — PROPOFOL 10 MG/ML IV BOLUS
INTRAVENOUS | Status: AC
  Filled 2017-12-13: qty 20

## 2017-12-13 MED ORDER — DEXAMETHASONE SODIUM PHOSPHATE 10 MG/ML IJ SOLN
INTRAMUSCULAR | Status: DC | PRN
  Administered 2017-12-13 (×2): 4 mg via INTRAVENOUS

## 2017-12-13 MED ORDER — BUPIVACAINE HCL (PF) 0.25 % IJ SOLN
INTRAMUSCULAR | Status: DC | PRN
  Administered 2017-12-13: 7 mL

## 2017-12-13 MED ORDER — KETOROLAC TROMETHAMINE 30 MG/ML IJ SOLN
INTRAMUSCULAR | Status: DC | PRN
  Administered 2017-12-13: 30 mg via INTRAMUSCULAR
  Administered 2017-12-13: 30 mg via INTRAVENOUS

## 2017-12-13 MED ORDER — ROCURONIUM BROMIDE 100 MG/10ML IV SOLN
INTRAVENOUS | Status: DC | PRN
  Administered 2017-12-13: 40 mg via INTRAVENOUS

## 2017-12-13 MED ORDER — FENTANYL CITRATE (PF) 100 MCG/2ML IJ SOLN: 25.0000 ug | INTRAMUSCULAR | Status: DC | PRN

## 2017-12-13 MED ORDER — FENTANYL CITRATE (PF) 100 MCG/2ML IJ SOLN
INTRAMUSCULAR | Status: DC | PRN
  Administered 2017-12-13 (×5): 50 ug via INTRAVENOUS

## 2017-12-13 MED ORDER — BUPIVACAINE HCL (PF) 0.25 % IJ SOLN
INTRAMUSCULAR | Status: AC
  Filled 2017-12-13: qty 30

## 2017-12-13 MED ORDER — LIDOCAINE HCL 1 % IJ SOLN
INTRAMUSCULAR | Status: DC | PRN
  Administered 2017-12-13: 10 mL

## 2017-12-13 MED ORDER — ONDANSETRON HCL 4 MG/2ML IJ SOLN
INTRAMUSCULAR | Status: DC | PRN
  Administered 2017-12-13: 4 mg via INTRAVENOUS

## 2017-12-13 MED ORDER — LIDOCAINE HCL (PF) 1 % IJ SOLN
INTRAMUSCULAR | Status: AC
  Filled 2017-12-13: qty 5

## 2017-12-13 MED ORDER — PROPOFOL 10 MG/ML IV BOLUS
INTRAVENOUS | Status: DC | PRN
  Administered 2017-12-13: 180 mg via INTRAVENOUS

## 2017-12-13 MED ORDER — PROMETHAZINE HCL 25 MG/ML IJ SOLN: 6.2500 mg | INTRAMUSCULAR | Status: DC | PRN

## 2017-12-13 MED ORDER — SUGAMMADEX SODIUM 200 MG/2ML IV SOLN
INTRAVENOUS | Status: DC | PRN
  Administered 2017-12-13: 200 mg via INTRAVENOUS

## 2017-12-13 MED ORDER — LACTATED RINGERS IV SOLN
INTRAVENOUS | Status: DC
  Administered 2017-12-13 (×2): via INTRAVENOUS

## 2017-12-13 MED ORDER — OXYCODONE-ACETAMINOPHEN 5-325 MG PO TABS: 2.0000 | ORAL_TABLET | Freq: Four times a day (QID) | ORAL | 0 refills | Status: AC | PRN

## 2017-12-13 MED ORDER — SODIUM CHLORIDE 0.9 % IR SOLN
Status: DC | PRN
  Administered 2017-12-13: 3000 mL

## 2017-12-13 MED ORDER — LIDOCAINE HCL 1 % IJ SOLN
INTRAMUSCULAR | Status: AC
  Filled 2017-12-13: qty 20

## 2017-12-13 MED ORDER — FENTANYL CITRATE (PF) 250 MCG/5ML IJ SOLN
INTRAMUSCULAR | Status: AC
Start: 2017-12-13 — End: 2017-12-13
  Filled 2017-12-13: qty 5

## 2017-12-13 MED ORDER — CEFAZOLIN SODIUM-DEXTROSE 2-4 GM/100ML-% IV SOLN
2.0000 g | INTRAVENOUS | Status: AC
  Administered 2017-12-13: 2 g via INTRAVENOUS

## 2017-12-13 MED ORDER — ONDANSETRON HCL 4 MG/2ML IJ SOLN
INTRAMUSCULAR | Status: AC
  Filled 2017-12-13: qty 2

## 2017-12-13 MED ORDER — KETOROLAC TROMETHAMINE 30 MG/ML IJ SOLN
INTRAMUSCULAR | Status: AC
  Filled 2017-12-13: qty 1

## 2017-12-13 MED ORDER — LIDOCAINE HCL (CARDIAC) PF 100 MG/5ML IV SOSY
PREFILLED_SYRINGE | INTRAVENOUS | Status: DC | PRN
  Administered 2017-12-13: 50 mg via INTRAVENOUS

## 2017-12-13 MED ORDER — SCOPOLAMINE 1 MG/3DAYS TD PT72
1.0000 | MEDICATED_PATCH | Freq: Once | TRANSDERMAL | Status: DC
  Administered 2017-12-13: 1.5 mg via TRANSDERMAL

## 2017-12-13 MED ORDER — METHYLENE BLUE 0.5 % INJ SOLN
INTRAVENOUS | Status: AC
  Filled 2017-12-13: qty 10

## 2017-12-13 MED ORDER — SUGAMMADEX SODIUM 200 MG/2ML IV SOLN
INTRAVENOUS | Status: AC
  Filled 2017-12-13: qty 2

## 2017-12-13 MED ORDER — MIDAZOLAM HCL 2 MG/2ML IJ SOLN
INTRAMUSCULAR | Status: DC | PRN
  Administered 2017-12-13 (×2): 1 mg via INTRAVENOUS

## 2017-12-13 MED ORDER — SCOPOLAMINE 1 MG/3DAYS TD PT72
MEDICATED_PATCH | TRANSDERMAL | Status: AC
  Administered 2017-12-13: 1.5 mg via TRANSDERMAL
  Filled 2017-12-13: qty 1

## 2017-12-13 SURGICAL SUPPLY — 38 items
ABLATOR ENDOMETRIAL BIPOLAR (ABLATOR) ×4 IMPLANT
BIPOLAR CUTTING LOOP 21FR (ELECTRODE)
CABLE HIGH FREQUENCY MONO STRZ (ELECTRODE) ×4 IMPLANT
CANISTER SUCT 3000ML PPV (MISCELLANEOUS) ×4 IMPLANT
CATH ROBINSON RED A/P 16FR (CATHETERS) ×4 IMPLANT
DERMABOND ADVANCED (GAUZE/BANDAGES/DRESSINGS) ×2
DERMABOND ADVANCED .7 DNX12 (GAUZE/BANDAGES/DRESSINGS) ×2 IMPLANT
DRSG OPSITE POSTOP 3X4 (GAUZE/BANDAGES/DRESSINGS) ×4 IMPLANT
DURAPREP 26ML APPLICATOR (WOUND CARE) ×4 IMPLANT
FILTER SMOKE EVAC LAPAROSHD (FILTER) IMPLANT
GLOVE BIO SURGEON STRL SZ7 (GLOVE) ×4 IMPLANT
GLOVE BIOGEL PI IND STRL 7.0 (GLOVE) ×4 IMPLANT
GLOVE BIOGEL PI INDICATOR 7.0 (GLOVE) ×4
GOWN STRL REUS W/TWL LRG LVL3 (GOWN DISPOSABLE) ×12 IMPLANT
LIGASURE VESSEL 5MM BLUNT TIP (ELECTROSURGICAL) IMPLANT
LOOP CUTTING BIPOLAR 21FR (ELECTRODE) IMPLANT
MANIPULATOR UTERINE 4.5 ZUMI (MISCELLANEOUS) ×4 IMPLANT
NS IRRIG 1000ML POUR BTL (IV SOLUTION) ×4 IMPLANT
PACK LAPAROSCOPY BASIN (CUSTOM PROCEDURE TRAY) ×4 IMPLANT
PACK TRENDGUARD 450 HYBRID PRO (MISCELLANEOUS) ×2 IMPLANT
PACK VAGINAL MINOR WOMEN LF (CUSTOM PROCEDURE TRAY) ×4 IMPLANT
PAD OB MATERNITY 4.3X12.25 (PERSONAL CARE ITEMS) ×4 IMPLANT
POUCH LAPAROSCOPIC INSTRUMENT (MISCELLANEOUS) ×4 IMPLANT
POUCH SPECIMEN RETRIEVAL 10MM (ENDOMECHANICALS) IMPLANT
PROTECTOR NERVE ULNAR (MISCELLANEOUS) ×8 IMPLANT
SCISSORS LAP 5X35 DISP (ENDOMECHANICALS) ×4 IMPLANT
SET IRRIG TUBING LAPAROSCOPIC (IRRIGATION / IRRIGATOR) ×4 IMPLANT
SLEEVE XCEL OPT CAN 5 100 (ENDOMECHANICALS) ×4 IMPLANT
SOLUTION ELECTROLUBE (MISCELLANEOUS) ×4 IMPLANT
SUT VICRYL 0 UR6 27IN ABS (SUTURE) ×4 IMPLANT
SUT VICRYL 4-0 PS2 18IN ABS (SUTURE) ×4 IMPLANT
TOWEL OR 17X24 6PK STRL BLUE (TOWEL DISPOSABLE) ×8 IMPLANT
TRENDGUARD 450 HYBRID PRO PACK (MISCELLANEOUS) ×4
TROCAR BALLN 12MMX100 BLUNT (TROCAR) IMPLANT
TROCAR XCEL NON-BLD 5MMX100MML (ENDOMECHANICALS) ×4 IMPLANT
TUBING AQUILEX INFLOW (TUBING) ×4 IMPLANT
TUBING AQUILEX OUTFLOW (TUBING) ×4 IMPLANT
WARMER LAPAROSCOPE (MISCELLANEOUS) ×4 IMPLANT

## 2017-12-13 NOTE — Discharge Instructions (Addendum)
Endometriosis Endometriosis is a condition in which the tissue that lines the uterus (endometrium) grows outside of its normal location. The tissue may grow in many locations close to the uterus, but it commonly grows on the ovaries, fallopian tubes, vagina, or bowel. When the uterus sheds the endometrium every menstrual cycle, there is bleeding wherever the endometrial tissue is located. This can cause pain because blood is irritating to tissues that are not normally exposed to it. What are the causes? The cause of endometriosis is not known. What increases the risk? You may be more likely to develop endometriosis if you:  Have a family history of endometriosis.  Have never given birth.  Started your period at age 28 or younger.  Have high levels of estrogen in your body.  Were exposed to a certain medicine (diethylstilbestrol) before you were born (in utero).  Had low birth weight.  Were born as a twin, triplet, or other multiple.  Have a BMI of less than 25. BMI is an estimate of body fat and is calculated from height and weight.  What are the signs or symptoms? Often, there are no symptoms of this condition. If you do have symptoms, they may:  Vary depending on where your endometrial tissue is growing.  Occur during your menstrual period (most common) or midcycle.  Come and go, or you may go months with no symptoms at all.  Stop with menopause.  Symptoms may include:  Pain in the back or abdomen.  Heavier bleeding during periods.  Pain during sex.  Painful bowel movements.  Infertility.  Pelvic pain.  Bleeding more than once a month.  How is this diagnosed? This condition is diagnosed based on your symptoms and a physical exam. You may have tests, such as:  Blood tests and urine tests. These may be done to help rule out other possible causes of your symptoms.  Ultrasound, to look for abnormal tissues.  An X-ray of the lower bowel (barium enema).  An  ultrasound that is done through the vagina (transvaginally).  CT scan.  MRI.  Laparoscopy. In this procedure, a lighted, pencil-sized instrument called a laparoscope is inserted into your abdomen through an incision. The laparoscope allows your health care provider to look at the organs inside your body and check for abnormal tissue to confirm the diagnosis. If abnormal tissue is found, your health care provider may remove a small piece of tissue (biopsy) to be examined under a microscope.  How is this treated? Treatment for this condition may include:  Medicines to relieve pain, such as NSAIDs.  Hormone therapy. This involves using artificial (synthetic) hormones to reduce endometrial tissue growth. Your health care provider may recommend using a hormonal form of birth control, or other medicines.  Surgery. This may be done to remove abnormal endometrial tissue. ? In some cases, tissue may be removed using a laparoscope and a laser (laparoscopic laser treatment). ? In severe cases, surgery may be done to remove the fallopian tubes, uterus, and ovaries (hysterectomy).  Follow these instructions at home:  Take over-the-counter and prescription medicines only as told by your health care provider.  Do not drive or use heavy machinery while taking prescription pain medicine.  Try to avoid activities that cause pain, including sexual activity.  Keep all follow-up visits as told by your health care provider. This is important. Contact a health care provider if:  You have pain in the area between your hip bones (pelvic area) that occurs: ? Before, during, or  after your period. ? In between your period and gets worse during your period. ? During or after sex. ? With bowel movements or urination, especially during your period.  You have problems getting pregnant.  You have a fever. Get help right away if:  You have severe pain that does not get better with medicine.  You have severe  nausea and vomiting, or you cannot eat without vomiting.  You have pain that affects only the lower, right side of your abdomen.  You have abdominal pain that gets worse.  You have abdominal swelling.  You have blood in your stool. This information is not intended to replace advice given to you by your health care provider. Make sure you discuss any questions you have with your health care provider. Document Released: 07/21/2000 Document Revised: 04/28/2016 Document Reviewed: 12/25/2015 Elsevier Interactive Patient Education  2018 [employer]  Endometrial Ablation Endometrial ablation is a procedure that destroys the thin inner layer of the lining of the uterus (endometrium). This procedure may be done:  To stop heavy periods.  To stop bleeding that is causing anemia.  To control irregular bleeding.  To treat bleeding caused by small tumors (fibroids) in the endometrium.  This procedure is often an alternative to major surgery, such as removal of the uterus and cervix (hysterectomy). As a result of this procedure:  You may not be able to have children. However, if you are premenopausal (you have not gone through menopause): ? You may still have a small chance of getting pregnant. ? You will need to use a reliable method of birth control after the procedure to prevent pregnancy.  You may stop having a menstrual period, or you may have only a small amount of bleeding during your period. Menstruation may return several years after the procedure.  Tell a health care provider about:  Any allergies you have.  All medicines you are taking, including vitamins, herbs, eye drops, creams, and over-the-counter medicines.  Any problems you or family members have had with the use of anesthetic medicines.  Any blood disorders you have.  Any surgeries you have had.  Any medical conditions you have. What are the risks? Generally, this is a safe procedure. However, problems may occur,  including:  A hole (perforation) in the uterus or bowel.  Infection of the uterus, bladder, or vagina.  Bleeding.  Damage to other structures or organs.  An air bubble in the lung (air embolus).  Problems with pregnancy after the procedure.  Failure of the procedure.  Decreased ability to diagnose cancer in the endometrium.  What happens before the procedure?  You will have tests of your endometrium to make sure there are no pre-cancerous cells or cancer cells present.  You may have an ultrasound of the uterus.  You may be given medicines to thin the endometrium.  Ask your health care provider about: ? Changing or stopping your regular medicines. This is especially important if you take diabetes medicines or blood thinners. ? Taking medicines such as aspirin and ibuprofen. These medicines can thin your blood. Do not take these medicines before your procedure if your doctor tells you not to.  Plan to have someone take you home from the hospital or clinic. What happens during the procedure?  You will lie on an exam table with your feet and legs supported as in a pelvic exam.  To lower your risk of infection: ? Your health care team will wash or sanitize their hands and put on  germ-free (sterile) gloves. ? Your genital area will be washed with soap.  An IV tube will be inserted into one of your veins.  You will be given a medicine to help you relax (sedative).  A surgical instrument with a light and camera (resectoscope) will be inserted into your vagina and moved into your uterus. This allows your surgeon to see inside your uterus.  Endometrial tissue will be removed using one of the following methods: ? Radiofrequency. This method uses a radiofrequency-alternating electric current to remove the endometrium. ? Cryotherapy. This method uses extreme cold to freeze the endometrium. ? Heated-free liquid. This method uses a heated saltwater (saline) solution to remove the  endometrium. ? Microwave. This method uses high-energy microwaves to heat up the endometrium and remove it. ? Thermal balloon. This method involves inserting a catheter with a balloon tip into the uterus. The balloon tip is filled with heated fluid to remove the endometrium. The procedure may vary among health care providers and hospitals. What happens after the procedure?  Your blood pressure, heart rate, breathing rate, and blood oxygen level will be monitored until the medicines you were given have worn off.  As tissue healing occurs, you may notice vaginal bleeding for 4-6 weeks after the procedure. You may also experience: ? Cramps. ? Thin, watery vaginal discharge that is light pink or brown in color. ? A need to urinate more frequently than usual. ? Nausea.  Do not drive for 24 hours if you were given a sedative.  Do not have sex or insert anything into your vagina until your health care provider approves. Summary  Endometrial ablation is done to treat the many causes of heavy menstrual bleeding.  The procedure may be done only after medications have been tried to control the bleeding.  Plan to have someone take you home from the hospital or clinic. This information is not intended to replace advice given to you by your health care provider. Make sure you discuss any questions you have with your health care provider. Document Released: 06/02/2004 Document Revised: 08/10/2016 Document Reviewed: 08/10/2016 Elsevier Interactive Patient Education  2017 [employer]  Post Anesthesia Home Care Instructions  No ibuprofen products until: 9:15 pm tonight  Activity: Get plenty of rest for the remainder of the day. A responsible individual must stay with you for 24 hours following the procedure.  For the next 24 hours, DO NOT: -Drive a car -Advertising copywriter -Drink alcoholic beverages -Take any medication unless instructed by your physician -Make any legal decisions or sign  important papers.  Meals: Start with liquid foods such as gelatin or soup. Progress to regular foods as tolerated. Avoid greasy, spicy, heavy foods. If nausea and/or vomiting occur, drink only clear liquids until the nausea and/or vomiting subsides. Call your physician if vomiting continues.  Special Instructions/Symptoms: Your throat may feel dry or sore from the anesthesia or the breathing tube placed in your throat during surgery. If this causes discomfort, gargle with warm salt water. The discomfort should disappear within 24 hours.  If you had a scopolamine patch placed behind your ear for the management of post- operative nausea and/or vomiting:  1. The medication in the patch is effective for 72 hours, after which it should be removed.  Wrap patch in a tissue and discard in the trash. Wash hands thoroughly with soap and water. 2. You may remove the patch earlier than 72 hours if you experience unpleasant side effects which may include dry mouth, dizziness or visual  disturbances. 3. Avoid touching the patch. Wash your hands with soap and water after contact with the patch.

## 2017-12-13 NOTE — Anesthesia Preprocedure Evaluation (Addendum)
Anesthesia Evaluation  Patient identified by MRN, date of birth, ID band Patient awake    Reviewed: Allergy & Precautions, NPO status , Patient's Chart, lab work & pertinent test results  Airway Mallampati: I  TM Distance: >3 FB Neck ROM: Full    Dental  (+) Teeth Intact, Dental Advisory Given   Pulmonary asthma ,    Pulmonary exam normal breath sounds clear to auscultation       Cardiovascular negative cardio ROS Normal cardiovascular exam Rhythm:Regular Rate:Normal     Neuro/Psych  Headaches, PSYCHIATRIC DISORDERS Depression Bipolar Disorder CVA, No Residual Symptoms    GI/Hepatic negative GI ROS, Neg liver ROS,   Endo/Other  Hypothyroidism   Renal/GU negative Renal ROS     Musculoskeletal negative musculoskeletal ROS (+)   Abdominal   Peds  Hematology negative hematology ROS (+)   Anesthesia Other Findings Day of surgery medications reviewed with the patient.  Reproductive/Obstetrics Menorrhagia, Endometriosis                            Anesthesia Physical Anesthesia Plan  ASA: III  Anesthesia Plan: General   Post-op Pain Management:    Induction: Intravenous  PONV Risk Score and Plan: 4 or greater and Scopolamine patch - Pre-op, Midazolam, Ondansetron and Dexamethasone  Airway Management Planned: Oral ETT  Additional Equipment:   Intra-op Plan:   Post-operative Plan: Extubation in OR  Informed Consent: I have reviewed the patients History and Physical, chart, labs and discussed the procedure including the risks, benefits and alternatives for the proposed anesthesia with the patient or authorized representative who has indicated his/her understanding and acceptance.   Dental advisory given  Plan Discussed with: CRNA  Anesthesia Plan Comments: (Risks/benefits of general anesthesia discussed with patient including risk of damage to teeth, lips, gum, and tongue,  nausea/vomiting, allergic reactions to medications, and the possibility of heart attack, stroke and death.  All patient questions answered.  Patient wishes to proceed.)        Anesthesia Quick Evaluation

## 2017-12-13 NOTE — Transfer of Care (Signed)
Immediate Anesthesia Transfer of Care Note  Patient: Teresa Guzman  Procedure(s) Performed: LAPAROSCOPY WITH RIGHT OVARIAN CYSTECTOMY, ENDOMETRIOSIS ABLATION, AND EXCISION OF LEFT URETERAL IMPLANT (Bilateral Abdomen) DILATATION & CURETTAGE/HYSTEROSCOPY WITH HYDROTHERMAL   ABLATION (N/A Vagina )  Patient Location: PACU  Anesthesia Type:General  Level of Consciousness: awake, alert , oriented and patient cooperative  Airway & Oxygen Therapy: Patient Spontanous Breathing and Patient connected to nasal cannula oxygen  Post-op Assessment: Report given to RN, Post -op Vital signs reviewed and stable and Patient moving all extremities X 4  Post vital signs: Reviewed and stable  Last Vitals:  Vitals Value Taken Time  BP    Temp    Pulse    Resp 16 12/13/2017  4:21 PM  SpO2    Vitals shown include unvalidated device data.  Last Pain:  Vitals:   12/13/17 1100  TempSrc: Oral      Patients Stated Pain Goal: 4 (12/13/17 1100)  Complications: No apparent anesthesia complications
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
## 2017-12-13 NOTE — Anesthesia Procedure Notes (Signed)
Procedure Name: Intubation Date/Time: 12/13/2017 2:19 PM Performed by: Cleda Clarks, CRNA Pre-anesthesia Checklist: Patient identified, Emergency Drugs available, Suction available and Patient being monitored Patient Re-evaluated:Patient Re-evaluated prior to induction Oxygen Delivery Method: Circle system utilized Preoxygenation: Pre-oxygenation with 100% oxygen Induction Type: IV induction Ventilation: Mask ventilation without difficulty Laryngoscope Size: Miller and 2 Grade View: Grade II Tube type: Oral Tube size: 7.0 mm Number of attempts: 1 Airway Equipment and Method: Stylet and Oral airway Placement Confirmation: ETT inserted through vocal cords under direct vision,  positive ETCO2 and breath sounds checked- equal and bilateral Secured at: 21 cm Tube secured with: Tape Dental Injury: Teeth and Oropharynx as per pre-operative assessment

## 2017-12-13 NOTE — H&P (Signed)
Teresa Guzman is an 37 y.o. female Menorrhagia, dysmenorrhea, endometriosis and right ovarian complex cyst. Infertility hx, declined IVF, has 2 adopted kids and fosters 4 kids. Happy and does not plan to have any intervention for infertility.  H/o endometriosis, bilateral ovarian cyst removal.  Now sono - has right ovary with 4 cm endometrioma, another 2 cm complex cyst and normal size uterus.  Normal pap hx.  Recent L/Center cholecystectomy in Mar'19  Patient's last menstrual period was 11/24/2017 (exact date).    Past Medical History:  Diagnosis Date  . Anemia   . Asthma    childhood asthma - no inhaler, no problems as adult  . Bipolar disorder (HCC)    dx while in college, no current meds  . Depression    no current meds  . Endometriosis   . Headache    HX  MIGRAINES During menstrual cycle  . Hypothyroidism   . Infertility, female   . Seasonal allergies   . Stroke [facility]) 09/09/15   hadright cerebellar infarct secondary to right vertebral artery dissection-no deficits, no treatment, no problems since 09/2015    Past Surgical History:  Procedure Laterality Date  . ANTERIOR CERVICAL DECOMP/DISCECTOMY FUSION N/A 03/08/2017   Procedure: ANTERIOR CERVICAL DECOMPRESSION/DISCECTOMY FUSION C5-6;  Surgeon: Venita Lick, MD;  Location: [facility] OR;  Service: Orthopedics;  Laterality: N/A;  3 hours  . arm surgery Left 1987  . CHOLECYSTECTOMY  10/2017   [place]  . COLONOSCOPY  06/2017   at [facility]  . CYST REMOVAL HAND     CYST REMOVED OFF OVARY - Not Hand  . OVARIAN CYST REMOVAL  2011   lowe abdominal excision  . WISDOM TOOTH EXTRACTION      Family History  Problem Relation Age of Onset  . Colon cancer Brother 33       chemo, sx   . Hyperlipidemia Mother   . Hypertension Father   . Hyperlipidemia Father   . Heart attack Father   . Diabetes Father     Social History:  reports that she has never smoked. She has never used smokeless tobacco. She reports that she does not drink  alcohol or use drugs.  Allergies:  Allergies  Allergen Reactions  . Morphine And Related Other (See Comments)    Headache for a long time per pt  . Neosporin [Neomycin-Bacitracin Zn-Polymyx] Rash    Medications Prior to Admission  Medication Sig Dispense Refill Last Dose  . Azelastine HCl 0.15 % SOLN USE 1 SPRAYS IN EACH NOSTRIL TWICE DAILY 90 mL 0 12/13/2017 at 0600  . fluticasone (FLONASE) 50 MCG/ACT nasal spray USE 1 TO 2 SPRAYS INTO THE  NOSE DAILY (Patient taking differently: Place 1 spray into both nostrils 2 (two) times daily. ) 32 g 2 12/13/2017 at 0600  . levothyroxine (SYNTHROID, LEVOTHROID) 100 MCG tablet TAKE 1 TABLET(100 MCG) BY MOUTH DAILY 30 tablet 6 12/13/2017 at 0600  . loratadine-pseudoephedrine (CLARITIN-D 24-HOUR) 10-240 MG 24 hr tablet Take 1 tablet by mouth at bedtime as needed for allergies.    Past Week at Unknown time  . montelukast (SINGULAIR) 10 MG tablet TAKE 1 TABLET BY MOUTH AT BEDTIME 90 tablet 0 12/12/2017 at 2100  . Multiple Vitamins-Minerals (ADULT ONE DAILY GUMMIES PO) Take 2 tablets by mouth daily.   Past Week at Unknown time  . naproxen (NAPROSYN) 500 MG tablet TAKE 1 TABLET BY MOUTH EVERY 12 HOURS. START 1 WEEK PRIOR TO ONSET OF YOUR PERIOD (Patient taking differently: Take  500 mg by mouth 2 (two) times daily as needed for moderate pain. START 1 WEEK PRIOR TO ONSET OF YOUR PERIOD) 28 tablet 0 Past Month at Unknown time  . OVER THE COUNTER MEDICATION Take 1 capsule by mouth daily. MegaFood Blood Builder   Past Week at Unknown time  . Vitamin D, Ergocalciferol, (DRISDOL) 50000 units CAPS capsule Take 1 capsule (50,000 Units total) by mouth every 7 (seven) days. 12 capsule 0 Past Week at Unknown time    ROS above  Physical Exam BP (!) 120/57   Pulse 93   Temp 97.8 F (36.6 C) (Oral)   Resp 18   Ht 5' (1.524 m)   Wt 141 lb 2 oz (64 kg)   LMP 11/24/2017 (Exact Date)   SpO2 100%   BMI 27.56 kg/m   Physical exam:  A&O x 3, no acute distress.  Pleasant HEENT neg, no thyromegaly Lungs CTA bilat CV RRR, S1S2 normal Abdo soft, non tender, non acute Extr no edema/ tenderness Pelvic Normal uterus, right ovary enlarged   Results for orders placed or performed during the hospital encounter of 12/13/17 (from the past 24 hour(s))  Pregnancy, urine     Status: None   Collection Time: 12/13/17 11:00 AM  Result Value Ref Range   Preg Test, Ur NEGATIVE NEGATIVE    No results found.  Assessment/Plan: 37 yo female with known endometriosis. Dysmenorrhea/ menorrhagia, right ovary cysts.  Here for hysteroscopy and Novasure endometrial ablation and right ovarian cystectomy, possible right oophorectomy   Patient declines hysterectomy as definitive surgery due to longer recovery and more risks and understands that may be needed if endometrial ablation failed. She was counseled on not planning pregnancy after endometrial ablation due to much increased risks including SAB/ abn placenta and risk if increta/ percreta. She voiced understanding.  She understands that her endometriosis can continue to get worse and may need more surgeries in future.   Robley Fries 12/13/2017, 11:56 AM

## 2017-12-13 NOTE — Anesthesia Postprocedure Evaluation (Signed)
Anesthesia Post Note  Patient: Teresa Guzman  Procedure(s) Performed: LAPAROSCOPY WITH RIGHT OVARIAN CYSTECTOMY, ENDOMETRIOSIS ABLATION, AND EXCISION OF LEFT URETERAL IMPLANT (Bilateral Abdomen) DILATATION & CURETTAGE/HYSTEROSCOPY WITH HYDROTHERMAL   ABLATION (N/A Vagina )     Patient location during evaluation: PACU Anesthesia Type: General Level of consciousness: awake and alert Pain management: pain level controlled Vital Signs Assessment: post-procedure vital signs reviewed and stable Respiratory status: spontaneous breathing, nonlabored ventilation and respiratory function stable Cardiovascular status: blood pressure returned to baseline and stable Postop Assessment: no apparent nausea or vomiting Anesthetic complications: no    Last Vitals:  Vitals:   12/13/17 1645 12/13/17 1700  BP: (!) 113/50 109/62  Pulse: (!) 101 (!) 102  Resp: 12 17  Temp:    SpO2: 98% 95%    Last Pain:  Vitals:   12/13/17 1700  TempSrc:   PainSc: 0-No pain   Pain Goal: Patients Stated Pain Goal: 4 (12/13/17 1700)               Cecile Hearing

## 2017-12-14 ENCOUNTER — Encounter (HOSPITAL_COMMUNITY): Payer: Self-pay | Admitting: Obstetrics & Gynecology

## 2018-01-03 DIAGNOSIS — R928 Other abnormal and inconclusive findings on diagnostic imaging of breast: Secondary | ICD-10-CM | POA: Diagnosis not present

## 2018-01-03 DIAGNOSIS — N6002 Solitary cyst of left breast: Secondary | ICD-10-CM | POA: Diagnosis not present

## 2018-01-03 DIAGNOSIS — N6324 Unspecified lump in the left breast, lower inner quadrant: Secondary | ICD-10-CM | POA: Diagnosis not present

## 2018-01-06 IMAGING — CT CT ANGIO NECK
1 of 8 series · 6 of 33 positions shown · IV contrast (Omni 300)
Comparison: MRI brain, MRA head, and MRA neck 09/09/2015.

CLINICAL DATA: Right-sided weakness and numbness. Vertebral artery
dissection. Right-sided neck pain.

EXAM:
CT ANGIOGRAPHY NECK
TECHNIQUE: Multidetector CT imaging of the neck was performed using the
standard protocol during bolus administration of intravenous
contrast. Multiplanar CT image reconstructions and MIPs were
obtained to evaluate the vascular anatomy. Carotid stenosis
measurements (when applicable) are obtained utilizing NASCET
criteria, using the distal internal carotid diameter as the
denominator.
CONTRAST:  50mL OMNIPAQUE IOHEXOL 350 MG/ML SOLN

[Series 6: cow thins · axial · 0.39mm/px · z∈[-260,-98]mm · 6 of 566 slices shown]
[im 81/566  soft-tissue]
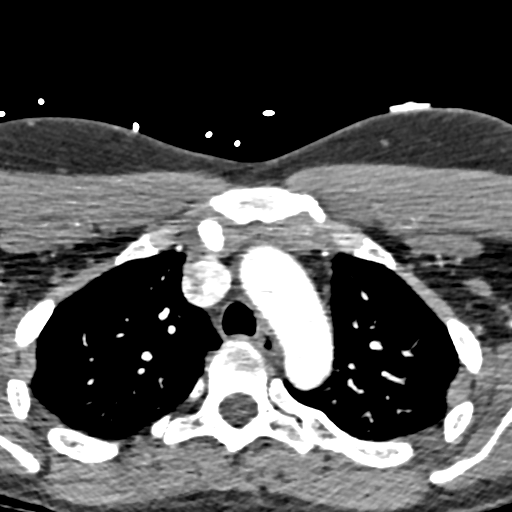
[im 162/566  bone]
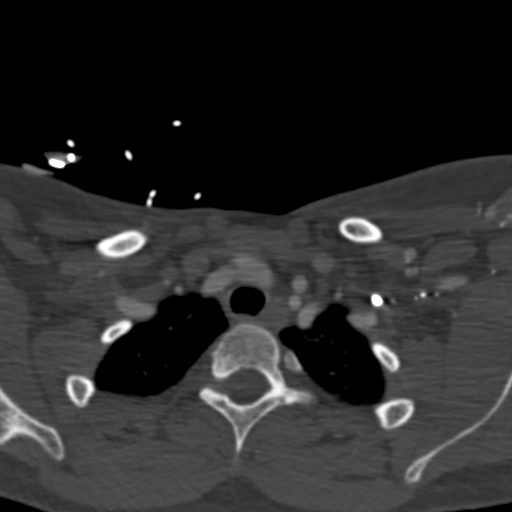
[im 243/566  soft-tissue]
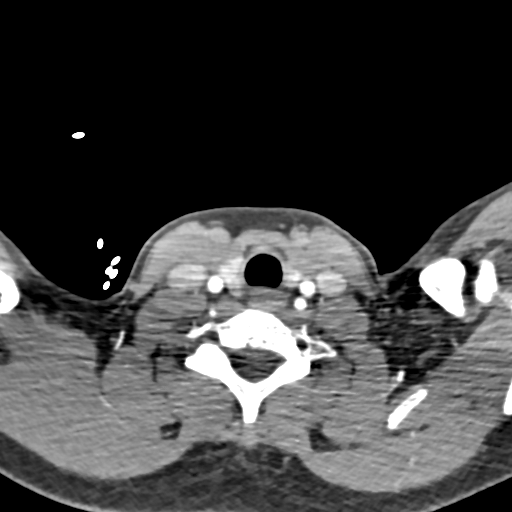
[im 323/566  bone]
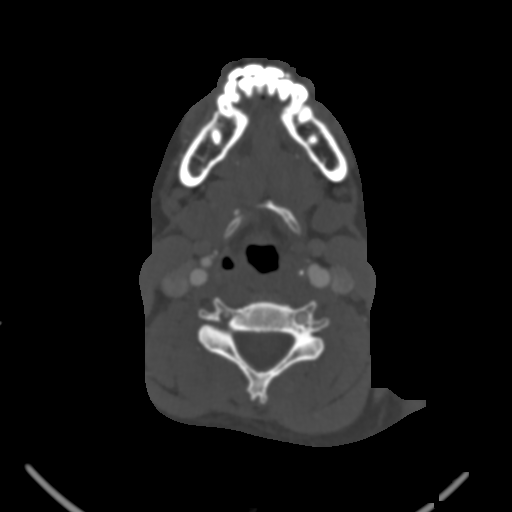
[im 404/566  soft-tissue]
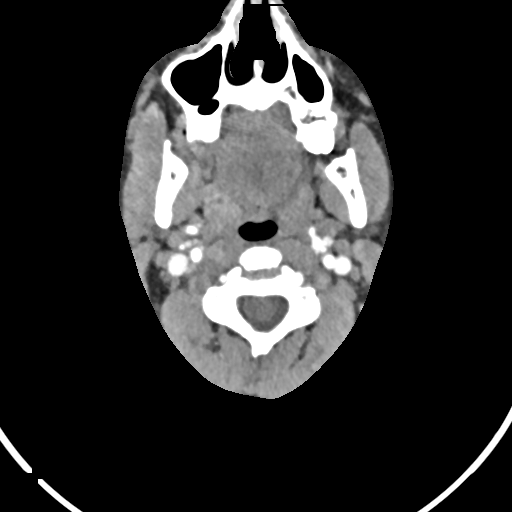
[im 485/566  bone]
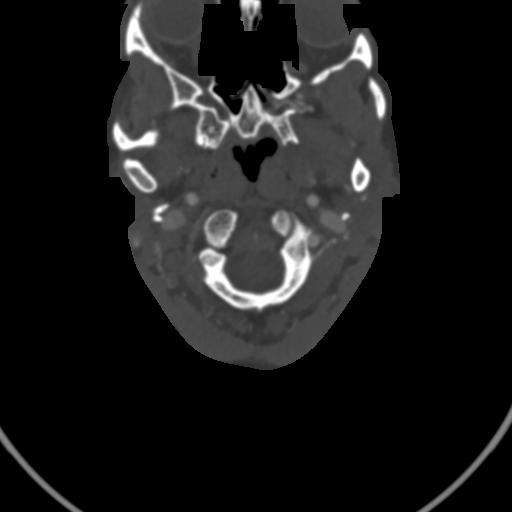

[6 of 33 positions shown; findings below may reference images not displayed]

FINDINGS: Aortic arch: The left vertebral artery originates directly from the
aortic arch. There is no focal calcification or stenosis in the
arch.

Right carotid system: The right common carotid artery is within
normal limits. The bifurcation is unremarkable. The cervical right
ICA is normal. The visualized intracranial ICA is within normal
limits as well.

Left carotid system: The left common carotid artery is within normal
limits. The bifurcation is unremarkable. Cervical left ICA is within
normal limits.

Vertebral arteries:The dominant left vertebral artery originates
directly from the aortic arch. The left vertebral artery enters at
C5-6. The non dominant right vertebral artery arises from the
subclavian artery and enters the spinal canal at C6-7. The
irregularity evident in the mid cervical right vertebral artery was
likely artifactual. There is focal irregular narrowing of the right
vertebral artery at the C1-2 level suggesting a focal dissection.
There is then segmental irregularity in the remainder of the right
vertebral artery which terminates at the PICA. The left vertebral
artery goes onto the come the basilar artery without a focal
stenosis.

Skeleton: Unremarkable

Other neck: No focal mucosal or submucosal lesions are present. The
vocal cords are midline and symmetric. No significant adenopathy is
present. The salivary glands are within normal limits. The lung
apices are clear.
IMPRESSION: 1. Focal irregularity and stenosis of the non dominant right
vertebral artery at the C1-2 level with segmental irregularity from
the out level superiorly to the right PICA origin compatible with
focal dissection.
2. The dominant left vertebral artery originates from the aortic
arch without any focal stenosis or irregularity.
3. The anterior circulation is within normal limits.

## 2018-01-15 ENCOUNTER — Encounter: Payer: Self-pay | Admitting: Family Medicine

## 2018-02-05 ENCOUNTER — Encounter: Payer: Self-pay | Admitting: Family Medicine

## 2018-02-08 ENCOUNTER — Encounter: Payer: Self-pay | Admitting: Family Medicine

## 2018-04-02 ENCOUNTER — Other Ambulatory Visit: Payer: Self-pay | Admitting: Family Medicine

## 2018-04-22 ENCOUNTER — Encounter: Payer: Self-pay | Admitting: Family Medicine

## 2018-04-24 ENCOUNTER — Encounter: Payer: Self-pay | Admitting: Family Medicine

## 2018-04-24 ENCOUNTER — Other Ambulatory Visit: Payer: Self-pay

## 2018-04-24 ENCOUNTER — Ambulatory Visit (INDEPENDENT_AMBULATORY_CARE_PROVIDER_SITE_OTHER): Payer: BLUE CROSS/BLUE SHIELD | Admitting: Family Medicine

## 2018-04-24 VITALS — BP 120/80 | HR 86 | Temp 98.1°F | Resp 16 | Ht 60.0 in | Wt 135.5 lb

## 2018-04-24 DIAGNOSIS — L237 Allergic contact dermatitis due to plants, except food: Secondary | ICD-10-CM

## 2018-04-24 MED ORDER — TRIAMCINOLONE ACETONIDE 0.1 % EX OINT: 1.0000 "application " | TOPICAL_OINTMENT | Freq: Two times a day (BID) | CUTANEOUS | 1 refills | Status: DC

## 2018-04-24 MED ORDER — METHYLPREDNISOLONE ACETATE 80 MG/ML IJ SUSP
80.0000 mg | Freq: Once | INTRAMUSCULAR | Status: AC
  Administered 2018-04-24: 80 mg via INTRAMUSCULAR

## 2018-04-24 MED ORDER — PREDNISONE 10 MG PO TABS: ORAL_TABLET | ORAL | 0 refills | Status: DC

## 2018-04-24 NOTE — Progress Notes (Signed)
   Subjective:    Patient ID: Teresa Guzman, female    DOB: 09/08/1980, 37 y.o.   MRN: [id number]  HPI Poison ivy- pt was clearing her flower beds 16 days ago.  Developed small patch of poison on L arm that has spread to a large patch that encompasses her dorsal forearm.  Has also extended to upper arm and L knee.  Continues to itch.  No relief w/ OTC hydrocortisone or other home remedies.   Review of Systems For ROS see HPI     Objective:   Physical Exam  Constitutional: She appears well-developed and well-nourished. No distress.  Skin: Skin is warm and dry. Rash (pt w/ confluent vesicular rash on L forearm w/ similar patch on L knee and scattered areas on R arm and R lower leg) noted. There is erythema.  Psychiatric: She has a normal mood and affect. Her behavior is normal. Thought content normal.  Vitals reviewed.         Assessment & Plan:  Poison ivy- pt's duration of sxs and extensive distribution warrant steroids.  Start w/ Depo-Medrol today and begin Pred taper tomorrow.  Triamcinolone ointment topically.  Reviewed supportive care and red flags that should prompt return.  Pt expressed understanding and is in agreement w/ plan.

## 2018-04-24 NOTE — Patient Instructions (Signed)
Follow up as needed or as scheduled START the Prednisone tomorrow- take 3 tabs at the same time x3 days, then 2 tabs x3 days, and then 1 tab daily Apply the triamcinolone twice daily to help w/ itching Call with any questions or concerns Hang in there!!!

## 2018-05-09 ENCOUNTER — Encounter: Payer: Self-pay | Admitting: Family Medicine

## 2018-05-09 NOTE — Progress Notes (Signed)
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    UPDATE: Patient last seen in June 2018.  At that time, she was diagnosed with left sided cervical radiculopathy.  She was started on gabapentin to treat radicular pain and headache.  For menstrual migraine, she was prescribed a perimenstrual prophylaxis of naproxen 500mg  twice daily for 2 weeks, starting one week prior to onset of period.  MRI of cervical spine from 01/26/17 was personally reviewed and demonstrated disc osteophyte complex causing severe left C5-6 neural foraminal stenosis with impingement of C6 nerve root.  She was advised to follow up with surgery.  She underwent ACDF C5-6 on 03/08/17.    She is no longer on gabapentin.  Perimenstrual prophylaxis with naproxen ineffective.  She is no longer on gabapentin which was ineffective.  Menstrual migraines persist.  When she exercises at the gym, she gets a headache.  It started 2 weeks ago.  It occurs 1 to 2 hours after she works out.  Even mildly strenuous activity triggers it.  They start in back of neck on the right and radiate up to behind the right eye.  It lasts until she goes to bed and it is resolved when she wakes up.  No associated visual disturbance, slurred speech, vertigo or unilateral numbness or weakness.  No radicular pain down the right arm.  She takes naproxen.  She drinks over 100 oz water daily.  She does not skip meals.  No preceding fall.  HISTORY: In February 2017, she sustained a right cerebellar stroke secondary to right vertebral dissection. She was falling to the right and became plegic on the right side. She was [facility]. MRI of brain revealed acute right cerebellar infarct. MRA neck revealed  right vertebral artery stenosis. Follow up CTA of neck confirmed right vertebral artery focal dissection at C1-2. There was segment irregularity in the right vertebral artery which raised suspicion of fibromuscular dysplasia. Echo was normal. She had nonspecific elevation of ANA. Hgb A1c 5.7%. LDL was 76. She was started on Lipitor 10mg  daily. She was started on ASA 325mg  and Plavix 75mg  daily.  Repeat MRA of the neck from 11/25/15 appears unchanged, revealing right vertebral dissection but vessel is patent and without pseudoaneurysm.  However, it is difficult to really visualize the vessel since it is small.  CTA of neck from 12/29/15 was personally reviewed and revealed that the focal dissection had resolved.  Lipitor and Plavix were subsequently discontinued and she was continued on ASA.  Following the stroke, she was experiencing right sided headache.  She previously responded to nortriptyline 50mg .  She also has migraines presenting as severe right frontal throbbing pain associated with nausea, vomiting, photophobia and phonophobia.  No associated visual disturbance or unilateral numbness or weakness.  Historically, they are menstrual-related, occurring daily for 2 weeks beginning the week of her menstrual period.  In May 2018, she started experiencing left posterior neck pain which radiates as a throbbing mild to moderate pain down the lateral aspect of her left arm up to just below the elbow.  For the first two days, she noted numbness and tingling in the last 2 fingers of her left hand, but that has since resolved.  She denies any weakness, ataxia  or visual disturbance.  The pain is constant.  Looking up or down, or turning her head to the right exacerbates the pain.  Holding her elbow with her arm crossed helps relieve the throbbing arm pain.  She stopped exercising at that time.  She tried muscle relaxants, stretches, heat, ice, acupuncture and has seen a chiropractor (without high-velocity,  low amplitude techniques or manipulation), which have been ineffective.  PAST MEDICAL HISTORY: Past Medical History:  Diagnosis Date  . Anemia   . Asthma    childhood asthma - no inhaler, no problems as adult  . Bipolar disorder (HCC)    dx while in college, no current meds  . Depression    no current meds  . Endometriosis   . Headache    HX  MIGRAINES During menstrual cycle  . Hypothyroidism   . Infertility, female   . Seasonal allergies   . Stroke (HCC) 09/09/15   hadright cerebellar infarct secondary to right vertebral artery dissection-no deficits, no treatment, no problems since 09/2015  . Vertebral artery dissection [facility])     MEDICATIONS: Current Outpatient Medications on File Prior to Visit  Medication Sig Dispense Refill  . Azelastine HCl 0.15 % SOLN USE 1 SPRAYS IN EACH NOSTRIL TWICE DAILY 90 mL 0  . fluticasone (FLONASE) 50 MCG/ACT nasal spray USE 1 TO 2 SPRAYS INTO THE  NOSE DAILY (Patient taking differently: Place 1 spray into both nostrils 2 (two) times daily. ) 32 g 2  . levothyroxine (SYNTHROID, LEVOTHROID) 100 MCG tablet TAKE 1 TABLET(100 MCG) BY MOUTH DAILY 30 tablet 6  . loratadine-pseudoephedrine (CLARITIN-D 24-HOUR) 10-240 MG 24 hr tablet Take 1 tablet by mouth at bedtime as needed for allergies.     . montelukast (SINGULAIR) 10 MG tablet TAKE 1 TABLET BY MOUTH AT BEDTIME 90 tablet 0  . Multiple Vitamins-Minerals (ADULT ONE DAILY GUMMIES PO) Take 2 tablets by mouth daily.    . naproxen (NAPROSYN) 500 MG tablet TAKE 1 TABLET BY MOUTH EVERY 12 HOURS. START 1 WEEK PRIOR TO ONSET OF YOUR PERIOD (Patient taking differently: Take 500 mg by mouth 2 (two) times daily as needed for moderate pain. START 1 WEEK PRIOR TO ONSET OF YOUR PERIOD) 28 tablet 0  . OVER THE COUNTER MEDICATION Take 1 capsule by mouth daily. [employer] Blood Builder    . predniSONE (DELTASONE) 10 MG tablet 3 tabs x3 days and then 2 tabs x3 days and then 1 tab x3 days.  Take w/ food. 18 tablet 0  .  triamcinolone ointment (KENALOG) 0.1 % Apply 1 application topically 2 (two) times daily. 90 g 1   No current facility-administered medications on file prior to visit.     ALLERGIES: Allergies  Allergen Reactions  . Morphine And Related Other (See Comments)    Headache for a long time per pt  . Neosporin [Neomycin-Bacitracin Zn-Polymyx] Rash    FAMILY HISTORY: Family History  Problem Relation Age of Onset  . Colon cancer Brother 33       chemo, sx   . Hyperlipidemia Mother   . Hypertension Father   . Hyperlipidemia Father   . Heart attack Father   . Diabetes Father    SOCIAL HISTORY: Social History   Socioeconomic History  . Marital status: Married    Spouse name: Not on file  . Number of children: Not on file  . Years of education: Not on file  . Highest education level: Not on file  Occupational History  . Not on  file  Social Needs  . Financial resource strain: Not on file  . Food insecurity:    Worry: Not on file    Inability: Not on file  . Transportation needs:    Medical: Not on file    Non-medical: Not on file  Tobacco Use  . Smoking status: Never Smoker  . Smokeless tobacco: Never Used  Substance and Sexual Activity  . Alcohol use: No  . Drug use: No  . Sexual activity: Yes    Birth control/protection: None  Lifestyle  . Physical activity:    Days per week: Not on file    Minutes per session: Not on file  . Stress: Not on file  Relationships  . Social connections:    Talks on phone: Not on file    Gets together: Not on file    Attends religious service: Not on file    Active member of club or organization: Not on file    Attends meetings of clubs or organizations: Not on file    Relationship status: Not on file  . Intimate partner violence:    Fear of current or ex partner: Not on file    Emotionally abused: Not on file    Physically abused: Not on file    Forced sexual activity: Not on file  Other Topics Concern  . Not on file  Social  History Narrative  . Not on file    REVIEW OF SYSTEMS: Constitutional: No fevers, chills, or sweats, no generalized fatigue, change in appetite Eyes: No visual changes, double vision, eye pain Ear, nose and throat: No hearing loss, ear pain, nasal congestion, sore throat Cardiovascular: No chest pain, palpitations Respiratory:  No shortness of breath at rest or with exertion, wheezes GastrointestinaI: No nausea, vomiting, diarrhea, abdominal pain, fecal incontinence Genitourinary:  No dysuria, urinary retention or frequency Musculoskeletal:  Neck pain Integumentary: No rash, pruritus, skin lesions Neurological: as above Psychiatric: No depression, insomnia, anxiety Endocrine: No palpitations, fatigue, diaphoresis, mood swings, change in appetite, change in weight, increased thirst Hematologic/Lymphatic:  No purpura, petechiae. Allergic/Immunologic: no itchy/runny eyes, nasal congestion, recent allergic reactions, rashes  PHYSICAL EXAM: Blood pressure 116/82, pulse 89, height 5' (1.524 m), weight 135 lb (61.2 kg), SpO2 95 %. General: No acute distress.  Patient appears well-groomed.  normal body habitus. Head:  Normocephalic/atraumatic Eyes:  Fundi examined but not visualized Neck: supple, right upper paraspinal tenderness, full range of motion Heart:  Regular rate and rhythm Lungs:  Clear to auscultation bilaterally Back: No paraspinal tenderness Neurological Exam: alert and oriented to person, place, and time. Attention span and concentration intact, recent and remote memory intact, fund of knowledge intact.  Speech fluent and not dysarthric, language intact.  CN II-XII intact. Bulk and tone normal, muscle strength 5/5 throughout.  Sensation to light touch intact.  Deep tendon reflexes 2+ throughout, toes downgoing.  Finger to nose and heel to shin testing intact.  Gait normal, Romberg negative.  IMPRESSION: 1.  Probable primary exercise headache 2.  History of right vertebral  artery dissection.  Without other lateralizing symptoms, another dissection not likely but need to rule out. 3.  Menstrual migraines  PLAN: 1.  Will get CTA of head and neck.  Until then, recommended to refrain from exercise 2.  To treat migraines and exercise headache, start propranolol ER 60mg  daily.  Advised to caution for lightheadedness.  We can increase to 80mg  daily in 4 weeks if needed. 3.  If she ultimately is able to return  to exercise, she will try indomethacin 50mg  30 to 60 minutes prior to work-out. 4.  Follow up in 3 to 4 months.  Shon Millet, DO  CC:  [name], MD

## 2018-05-10 ENCOUNTER — Encounter: Payer: Self-pay | Admitting: Neurology

## 2018-05-10 ENCOUNTER — Ambulatory Visit (INDEPENDENT_AMBULATORY_CARE_PROVIDER_SITE_OTHER): Payer: BLUE CROSS/BLUE SHIELD | Admitting: Neurology

## 2018-05-10 VITALS — BP 116/82 | HR 89 | Ht 60.0 in | Wt 135.0 lb

## 2018-05-10 DIAGNOSIS — I7774 Dissection of vertebral artery: Secondary | ICD-10-CM | POA: Diagnosis not present

## 2018-05-10 DIAGNOSIS — G43829 Menstrual migraine, not intractable, without status migrainosus: Secondary | ICD-10-CM | POA: Diagnosis not present

## 2018-05-10 DIAGNOSIS — G4484 Primary exertional headache: Secondary | ICD-10-CM | POA: Diagnosis not present

## 2018-05-10 MED ORDER — INDOMETHACIN 50 MG PO CAPS: ORAL_CAPSULE | ORAL | 3 refills | Status: DC

## 2018-05-10 MED ORDER — PROPRANOLOL HCL ER 60 MG PO CP24: 60.0000 mg | ORAL_CAPSULE | Freq: Every day | ORAL | 3 refills | Status: DC

## 2018-05-10 NOTE — Patient Instructions (Addendum)
1.  Start propranolol ER 60mg  daily.  Contact me if you feel lightheadedness.  If headaches not improved in 4 weeks, contact me and we can increase dose. 2.  Take indomethacin 50mg  30 to 60 minutes prior to workout. 3.  We will check CTA of head and neck.  Until then refrain from exercising. 4.  Follow up in 3 to 4 months.  We have sent a referral to [facility] for your MRI and they will call you directly to schedule your appt. You will need to contact them directly to schedule your tests, please call [phone].

## 2018-05-14 ENCOUNTER — Telehealth: Payer: Self-pay

## 2018-05-14 NOTE — Telephone Encounter (Signed)
Rcvd call from Arrie Senate, [facility]. Pt is scheduled tomorrow for CTA of both head and neck, has not been pre-certed. I advised Shawna Orleans I will call her back with determination.  Called [facility], spoke with [place], gave clinicals, both tests approved. [id number] PA# [id number] valid 05/14/18 - 06/12/18  Called Arrie Senate [phone], got her VM, LM advising of PA# and valid dates

## 2018-05-15 ENCOUNTER — Encounter (HOSPITAL_BASED_OUTPATIENT_CLINIC_OR_DEPARTMENT_OTHER): Payer: Self-pay | Admitting: Diagnostic Radiology

## 2018-05-15 ENCOUNTER — Ambulatory Visit (HOSPITAL_BASED_OUTPATIENT_CLINIC_OR_DEPARTMENT_OTHER)
Admission: RE | Admit: 2018-05-15 | Discharge: 2018-05-15 | Disposition: A | Discharge status: Home / Self Care | Payer: BLUE CROSS/BLUE SHIELD | Source: Ambulatory Visit | Attending: Neurology | Admitting: Neurology

## 2018-05-15 ENCOUNTER — Telehealth: Payer: Self-pay | Admitting: Neurology

## 2018-05-15 DIAGNOSIS — I7774 Dissection of vertebral artery: Secondary | ICD-10-CM | POA: Diagnosis not present

## 2018-05-15 DIAGNOSIS — G4484 Primary exertional headache: Secondary | ICD-10-CM | POA: Insufficient documentation

## 2018-05-15 DIAGNOSIS — R51 Headache: Secondary | ICD-10-CM | POA: Diagnosis not present

## 2018-05-15 DIAGNOSIS — I63111 Cerebral infarction due to embolism of right vertebral artery: Secondary | ICD-10-CM

## 2018-05-15 MED ORDER — IOPAMIDOL (ISOVUE-370) INJECTION 76%
100.0000 mL | Freq: Once | INTRAVENOUS | Status: AC | PRN
  Administered 2018-05-15: 80 mL via INTRAVENOUS

## 2018-05-15 NOTE — Telephone Encounter (Signed)
MR ordered. They will call patient to schedule.

## 2018-05-15 NOTE — Addendum Note (Signed)
Addended bySilvio Pate on: 05/15/2018 03:59 PM   Modules accepted: Orders

## 2018-05-15 NOTE — Telephone Encounter (Signed)
I called and spoke with Teresa Guzman regarding the CTA results.  The right vertebral artery has healed well.  However, it looks like she had a dissection to the left vertebral artery.  There does not seem to be any findings consistent with FMD.  I recommended that she stop all exercise.  She should take ASA 81mg  daily as recommended.  She may take propranolol for the headache.  I would like to check MRI of brain to evaluate for new stroke.  I would also like to repeat CTA of head and neck in 6 months.

## 2018-05-16 ENCOUNTER — Telehealth: Payer: Self-pay | Admitting: Neurology

## 2018-05-16 DIAGNOSIS — Z8673 Personal history of transient ischemic attack (TIA), and cerebral infarction without residual deficits: Secondary | ICD-10-CM

## 2018-05-16 DIAGNOSIS — G43829 Menstrual migraine, not intractable, without status migrainosus: Secondary | ICD-10-CM

## 2018-05-16 DIAGNOSIS — G4484 Primary exertional headache: Secondary | ICD-10-CM

## 2018-05-16 DIAGNOSIS — R768 Other specified abnormal immunological findings in serum: Secondary | ICD-10-CM

## 2018-05-16 DIAGNOSIS — I63111 Cerebral infarction due to embolism of right vertebral artery: Secondary | ICD-10-CM

## 2018-05-16 DIAGNOSIS — I7774 Dissection of vertebral artery: Secondary | ICD-10-CM

## 2018-05-16 NOTE — Telephone Encounter (Signed)
Lab orders placed.  

## 2018-05-16 NOTE — Addendum Note (Signed)
Addended by: Horatio Pel on: 05/16/2018 09:50 AM   Modules accepted: Orders

## 2018-05-16 NOTE — Telephone Encounter (Signed)
I would like to perform a vasculitis workup as well: Check ANA, Sed Rate, CRP, RF, Sjogren's antibodies (SSA/SSB), ANCA

## 2018-05-17 MED ORDER — TOPIRAMATE 50 MG PO TABS: 50.0000 mg | ORAL_TABLET | Freq: Every day | ORAL | 0 refills | Status: DC

## 2018-05-17 NOTE — Addendum Note (Signed)
Addended bySilvio Pate on: 05/17/2018 02:28 PM   Modules accepted: Orders

## 2018-05-25 ENCOUNTER — Ambulatory Visit (HOSPITAL_BASED_OUTPATIENT_CLINIC_OR_DEPARTMENT_OTHER)
Admission: RE | Admit: 2018-05-25 | Discharge: 2018-05-25 | Disposition: A | Discharge status: Home / Self Care | Payer: BLUE CROSS/BLUE SHIELD | Source: Ambulatory Visit | Attending: Neurology | Admitting: Neurology

## 2018-05-25 DIAGNOSIS — Z8673 Personal history of transient ischemic attack (TIA), and cerebral infarction without residual deficits: Secondary | ICD-10-CM | POA: Insufficient documentation

## 2018-05-25 DIAGNOSIS — I7774 Dissection of vertebral artery: Secondary | ICD-10-CM | POA: Insufficient documentation

## 2018-05-25 DIAGNOSIS — I63111 Cerebral infarction due to embolism of right vertebral artery: Secondary | ICD-10-CM

## 2018-05-25 DIAGNOSIS — R51 Headache: Secondary | ICD-10-CM | POA: Diagnosis not present

## 2018-05-31 ENCOUNTER — Other Ambulatory Visit (INDEPENDENT_AMBULATORY_CARE_PROVIDER_SITE_OTHER): Payer: BLUE CROSS/BLUE SHIELD

## 2018-05-31 DIAGNOSIS — I63111 Cerebral infarction due to embolism of right vertebral artery: Secondary | ICD-10-CM | POA: Diagnosis not present

## 2018-05-31 DIAGNOSIS — I7774 Dissection of vertebral artery: Secondary | ICD-10-CM | POA: Diagnosis not present

## 2018-05-31 DIAGNOSIS — G4484 Primary exertional headache: Secondary | ICD-10-CM

## 2018-05-31 DIAGNOSIS — G43829 Menstrual migraine, not intractable, without status migrainosus: Secondary | ICD-10-CM | POA: Diagnosis not present

## 2018-05-31 DIAGNOSIS — R768 Other specified abnormal immunological findings in serum: Secondary | ICD-10-CM

## 2018-05-31 DIAGNOSIS — Z8673 Personal history of transient ischemic attack (TIA), and cerebral infarction without residual deficits: Secondary | ICD-10-CM

## 2018-06-04 LAB — ANA: Anti Nuclear Antibody(ANA): NEGATIVE

## 2018-06-04 LAB — SEDIMENTATION RATE: Sed Rate: 9 mm/h (ref 0–20)

## 2018-06-04 LAB — SJOGREN'S SYNDROME ANTIBODS(SSA + SSB)
SSA (Ro) (ENA) Antibody, IgG: <1 AI
SSB (La) (ENA) Antibody, IgG: <1 AI

## 2018-06-04 LAB — ANCA SCREEN W REFLEX TITER: ANCA Screen: NEGATIVE

## 2018-06-05 ENCOUNTER — Other Ambulatory Visit: Payer: Self-pay

## 2018-06-05 MED ORDER — ERENUMAB-AOOE 70 MG/ML ~~LOC~~ SOAJ: 70.0000 mg | SUBCUTANEOUS | 11 refills | Status: DC

## 2018-06-06 ENCOUNTER — Encounter: Payer: Self-pay | Admitting: *Deleted

## 2018-06-06 ENCOUNTER — Telehealth: Payer: Self-pay | Admitting: *Deleted

## 2018-06-06 NOTE — Telephone Encounter (Signed)
-----   Message from Van Clines, MD sent at 06/04/2018  2:56 PM EDT ----- Pls let patient know bloodwork was normal, thanks

## 2018-06-06 NOTE — Telephone Encounter (Signed)
Results sent via My Chart.  

## 2018-06-14 ENCOUNTER — Other Ambulatory Visit: Payer: Self-pay

## 2018-06-14 ENCOUNTER — Encounter: Payer: Self-pay | Admitting: Physician Assistant

## 2018-06-14 ENCOUNTER — Ambulatory Visit (INDEPENDENT_AMBULATORY_CARE_PROVIDER_SITE_OTHER): Payer: BLUE CROSS/BLUE SHIELD | Admitting: Physician Assistant

## 2018-06-14 VITALS — BP 112/80 | HR 93 | Temp 98.1°F | Resp 16 | Ht 60.0 in | Wt 138.0 lb

## 2018-06-14 DIAGNOSIS — R062 Wheezing: Secondary | ICD-10-CM

## 2018-06-14 DIAGNOSIS — J208 Acute bronchitis due to other specified organisms: Secondary | ICD-10-CM | POA: Diagnosis not present

## 2018-06-14 DIAGNOSIS — B9689 Other specified bacterial agents as the cause of diseases classified elsewhere: Secondary | ICD-10-CM | POA: Diagnosis not present

## 2018-06-14 MED ORDER — PROMETHAZINE-DM 6.25-15 MG/5ML PO SYRP: 5.0000 mL | ORAL_SOLUTION | Freq: Four times a day (QID) | ORAL | 0 refills | Status: DC | PRN

## 2018-06-14 MED ORDER — ALBUTEROL SULFATE HFA 108 (90 BASE) MCG/ACT IN AERS: 2.0000 | INHALATION_SPRAY | Freq: Four times a day (QID) | RESPIRATORY_TRACT | 0 refills | Status: DC | PRN

## 2018-06-14 MED ORDER — ALBUTEROL SULFATE (2.5 MG/3ML) 0.083% IN NEBU
2.5000 mg | INHALATION_SOLUTION | Freq: Once | RESPIRATORY_TRACT | Status: AC
  Administered 2018-06-14: 2.5 mg via RESPIRATORY_TRACT

## 2018-06-14 MED ORDER — AZITHROMYCIN 250 MG PO TABS: ORAL_TABLET | ORAL | 0 refills | Status: DC

## 2018-06-14 NOTE — Progress Notes (Addendum)
Subjective:     Teresa Guzman is a 37 y.o. female who presents for evaluation of symptoms of a URI. Symptoms include congestion, cough described as initially non-productive but now productive of thick sputum, nasal congestion, post nasal drip and wheezing. Onset of symptoms was 9 days ago, and has been rapidly worsening since that time. Treatment to date: antihistamines, cough suppressants and decongestants.  The following portions of the patient's history were reviewed and updated as appropriate: allergies, current medications, past family history, past medical history, past social history, past surgical history and problem list.  Review of Systems Pertinent items are noted in HPI.   Objective:    BP 112/80   Pulse 93   Temp 98.1 F (36.7 C) (Oral)   Resp 16   Ht 5' (1.524 m)   Wt 138 lb (62.6 kg)   SpO2 98%   BMI 26.95 kg/m  General appearance: alert, cooperative, appears stated age and no distress Head: Normocephalic, without obvious abnormality, atraumatic Ears: normal TM's and external ear canals both ears Nose: Nares normal. Septum midline. Mucosa normal. No drainage or sinus tenderness. Throat: lips, mucosa, and tongue normal; teeth and gums normal Lungs: wheezes base - bilateral Heart: regular rate and rhythm, S1, S2 normal, no murmur, click, rub or gallop Lymph nodes: Cervical, supraclavicular, and axillary nodes normal.   Assessment:   1. Acute bacterial bronchitis 2. Wheezing  Plan:   Patient given nebulizer treatment in office with improvement in wheeze. Rx Azithromycin.  Increase fluids.  Rest.  Saline nasal spray.  Probiotic.  Mucinex as directed.  Humidifier in bedroom. Promethazine-DM as directed.  Call or return to clinic if symptoms are not improving.

## 2018-06-14 NOTE — Addendum Note (Signed)
Addended by: Con Memos on: 06/14/2018 11:06 AM   Modules accepted: Orders

## 2018-06-14 NOTE — Patient Instructions (Signed)
Take antibiotic (Azithromycin) as directed.  Increase fluids.  Get plenty of rest. Use Promethazine-DM and Albuterol inhaler as directed. Take a daily probiotic (I recommend Align or Culturelle, but even Activia Yogurt may be beneficial).  A humidifier placed in the bedroom may offer some relief for a dry, scratchy throat of nasal irritation.  Read information below on acute bronchitis. Please call or return to clinic if symptoms are not improving.  Acute Bronchitis Bronchitis is when the airways that extend from the windpipe into the lungs get red, puffy, and painful (inflamed). Bronchitis often causes thick spit (mucus) to develop. This leads to a cough. A cough is the most common symptom of bronchitis. In acute bronchitis, the condition usually begins suddenly and goes away over time (usually in 2 weeks). Smoking, allergies, and asthma can make bronchitis worse. Repeated episodes of bronchitis may cause more lung problems.  HOME CARE  Rest.  Drink enough fluids to keep your pee (urine) clear or pale yellow (unless you need to limit fluids as told by your doctor).  Only take over-the-counter or prescription medicines as told by your doctor.  Avoid smoking and secondhand smoke. These can make bronchitis worse. If you are a smoker, think about using nicotine gum or skin patches. Quitting smoking will help your lungs heal faster.  Reduce the chance of getting bronchitis again by:  Washing your hands often.  Avoiding people with cold symptoms.  Trying not to touch your hands to your mouth, nose, or eyes.  Follow up with your doctor as told.  GET HELP IF: Your symptoms do not improve after 1 week of treatment. Symptoms include:  Cough.  Fever.  Coughing up thick spit.  Body aches.  Chest congestion.  Chills.  Shortness of breath.  Sore throat.  GET HELP RIGHT AWAY IF:   You have an increased fever.  You have chills.  You have severe shortness of breath.  You have  bloody thick spit (sputum).  You throw up (vomit) often.  You lose too much body fluid (dehydration).  You have a severe headache.  You faint.  MAKE SURE YOU:   Understand these instructions.  Will watch your condition.  Will get help right away if you are not doing well or get worse. Document Released: 01/10/2008 Document Revised: 03/26/2013 Document Reviewed: 01/14/2013 [employer] Patient Information 2015 [place], [place]. This information is not intended to replace advice given to you by your health care provider. Make sure you discuss any questions you have with your health care provider.

## 2018-09-23 NOTE — Progress Notes (Addendum)
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   UPDATE: To evaluate new onset headaches, she had a CTA of the head and neck performed on 05/15/18 which was personally reviewed and demonstrated occluded left vertebral artery 10 mm beyond its origin but reconstituted at C5-C6 and remaining patent to the vertebrobasilar junction with patent left PICA.  Right vertebral artery appears improved compared to prior imaging in 2017.  She had a follow up MRI of the brain on 05/25/18, which was personally reviewed and demonstrated remote right cerebellar infarct but no new infarct corresponding the the occluded left vertebral artery.  Vasculitis workup, including ANA, Sed Rate, ANCA and Sjogren's antibodies, was unremarkable.  She was advised to restart ASA 81mg  daily.  She was started on propranolol 60mg  for headaches but it was ineffective.  I recommended Aimovig but she has not started it.  Headaches are menstrual related.  She started light exercise a few weeks ago.  She does cross fit but no weights or running.  She started indomethacin 30 days prior to going to the gym, and she has not had any headache.    HISTORY: In February 2017, she sustained a right cerebellar stroke secondary to right vertebral dissection.She was falling to the right and became plegic on the right side.She was admitted to [facility] on 09/09/15.MRI of brain revealed acute right cerebellar infarct.MRA neck revealed right vertebral artery stenosis.Follow up CTA of neck confirmed right vertebral artery focal dissection at C1-2.There was segment irregularity in the right vertebral artery which raised suspicion of fibromuscular dysplasia.Echo was  normal.She had nonspecific elevation of ANA.Hgb A1c 5.7%.LDL was 76.She was started on Lipitor 10mg  daily.She was started on ASA 325mg  and Plavix 75mg  daily. Repeat MRA of the neck from 11/25/15 appears unchanged, revealing right vertebral dissection but vessel is patent and without pseudoaneurysm. However, it is difficult to really visualize the vessel since it is small. CTA of neck from 12/29/15 was personally reviewed and revealed that the focal dissection had resolved. Lipitor and Plavix were subsequently discontinued and she was continued on ASA.  Regarding the elevated ANA, she followed up with rheumatology and workup was negative.  Following the stroke, she was experiencing right sided headache. She previously responded to nortriptyline 50mg .  She also has migraines presenting as severe right frontal throbbing pain associated with nausea, vomiting, photophobia and phonophobia.  No associated visual disturbance or unilateral numbness or weakness.  Historically, they are menstrual-related, occurring daily for 2 weeks beginning the week of her menstrual period.  In May 2018, she started experiencing left posterior neck pain which radiates as a throbbing mild to moderate pain down the lateral aspect of her left arm up to just below the elbow. For the first two days, she noted numbness and tingling in the last 2 fingers of her left hand, but that has since resolved. She denies any weakness, ataxia or visual disturbance. The pain is constant. Looking up or down, or turning her head to the right exacerbates the pain. Holding her elbow with her arm crossed helps relieve the throbbing arm pain. She stopped exercising at that time. She tried muscle relaxants, stretches, heat, ice, acupuncture and has seen a chiropractor (without high-velocity, low amplitude techniques or manipulation), which have  been ineffective.  She was diagnosed with left sided cervical radiculopathy.  She was started  on gabapentin to treat radicular pain and headache.  For menstrual migraine, she was prescribed a perimenstrual prophylaxis of naproxen 500mg  twice daily for 2 weeks, starting one week prior to onset of period.  MRI of cervical spine from 01/26/17 was personally reviewed and demonstrated disc osteophyte complex causing severe left C5-6 neural foraminal stenosis with impingement of C6 nerve root.  She was advised to follow up with surgery.  She underwent ACDF C5-6 on 03/08/17.   In September 2019, she started getting a headache while working out at [employer]  It would occur 1 to 2 hours after she works out.  Even mildly strenuous activity would trigger it.  They start in back of neck on the right and radiate up to behind the right eye.  It lasts until she goes to bed and it is resolved when she wakes up.  No associated visual disturbance, slurred speech, vertigo or unilateral numbness or weakness.  No radicular pain down the right arm.   PAST MEDICAL HISTORY: Past Medical History:  Diagnosis Date  . Anemia   . Asthma    childhood asthma - no inhaler, no problems as adult  . Bipolar disorder (HCC)    dx while in college, no current meds  . Depression    no current meds  . Endometriosis   . Headache    HX  MIGRAINES During menstrual cycle  . Hypothyroidism   . Infertility, female   . Seasonal allergies   . Stroke (HCC) 09/09/15   hadright cerebellar infarct secondary to right vertebral artery dissection-no deficits, no treatment, no problems since 09/2015  . Vertebral artery dissection [facility])     MEDICATIONS: Current Outpatient Medications on File Prior to Visit  Medication Sig Dispense Refill  . albuterol (PROVENTIL HFA;VENTOLIN HFA) 108 (90 Base) MCG/ACT inhaler Inhale 2 puffs into the lungs every 6 (six) hours as needed for wheezing or shortness of breath. 1 Inhaler 0  . Azelastine HCl 0.15 % SOLN USE 1 SPRAYS IN EACH NOSTRIL TWICE DAILY 90 mL 0  . azithromycin (ZITHROMAX) 250 MG tablet Take 2  tablets on Day 1. Then take 1 tablet daily 6 tablet 0  . fluticasone (FLONASE) 50 MCG/ACT nasal spray USE 1 TO 2 SPRAYS INTO THE  NOSE DAILY (Patient taking differently: Place 1 spray into both nostrils 2 (two) times daily. ) 32 g 2  . levothyroxine (SYNTHROID, LEVOTHROID) 100 MCG tablet TAKE 1 TABLET(100 MCG) BY MOUTH DAILY 30 tablet 6  . loratadine-pseudoephedrine (CLARITIN-D 24-HOUR) 10-240 MG 24 hr tablet Take 1 tablet by mouth at bedtime as needed for allergies.     . Multiple Vitamins-Minerals (ADULT ONE DAILY GUMMIES PO) Take 2 tablets by mouth daily.    . naproxen (NAPROSYN) 500 MG tablet TAKE 1 TABLET BY MOUTH EVERY 12 HOURS. START 1 WEEK PRIOR TO ONSET OF YOUR PERIOD (Patient taking differently: Take 500 mg by mouth 2 (two) times daily as needed for moderate pain. START 1 WEEK PRIOR TO ONSET OF YOUR PERIOD) 28 tablet 0  . promethazine-dextromethorphan (PROMETHAZINE-DM) 6.25-15 MG/5ML syrup Take 5 mLs by mouth 4 (four) times daily as needed. 118 mL 0  . topiramate (TOPAMAX) 50 MG tablet Take 1 tablet (50 mg total) by mouth at bedtime. (Patient not taking: Reported on 06/14/2018) 30 tablet 0   No current facility-administered medications on file prior to visit.     ALLERGIES: Allergies  Allergen Reactions  . Morphine And Related Other (See Comments)    Headache for a long time per pt  . Morphine Other (See Comments)    headache  . Neosporin [Neomycin-Bacitracin Zn-Polymyx] Rash    FAMILY HISTORY: Family History  Problem Relation Age of Onset  . Colon cancer Brother 33       chemo, sx   . Hyperlipidemia Mother   . Hypertension Father   . Hyperlipidemia Father   . Heart attack Father   . Diabetes Father    SOCIAL HISTORY: Social History   Socioeconomic History  . Marital status: Married    Spouse name: Not on file  . Number of children: Not on file  . Years of education: Not on file  . Highest education level: Not on file  Occupational History  . Not on file  Social  Needs  . Financial resource strain: Not on file  . Food insecurity:    Worry: Not on file    Inability: Not on file  . Transportation needs:    Medical: Not on file    Non-medical: Not on file  Tobacco Use  . Smoking status: Never Smoker  . Smokeless tobacco: Never Used  Substance and Sexual Activity  . Alcohol use: No  . Drug use: No  . Sexual activity: Yes    Birth control/protection: None  Lifestyle  . Physical activity:    Days per week: Not on file    Minutes per session: Not on file  . Stress: Not on file  Relationships  . Social connections:    Talks on phone: Not on file    Gets together: Not on file    Attends religious service: Not on file    Active member of club or organization: Not on file    Attends meetings of clubs or organizations: Not on file    Relationship status: Not on file  . Intimate partner violence:    Fear of current or ex partner: Not on file    Emotionally abused: Not on file    Physically abused: Not on file    Forced sexual activity: Not on file  Other Topics Concern  . Not on file  Social History Narrative  . Not on file    REVIEW OF SYSTEMS: Constitutional: No fevers, chills, or sweats, no generalized fatigue, change in appetite Eyes: No visual changes, double vision, eye pain Ear, nose and throat: No hearing loss, ear pain, nasal congestion, sore throat Cardiovascular: No chest pain, palpitations Respiratory:  No shortness of breath at rest or with exertion, wheezes GastrointestinaI: No nausea, vomiting, diarrhea, abdominal pain, fecal incontinence Genitourinary:  No dysuria, urinary retention or frequency Musculoskeletal:  No neck pain, back pain Integumentary: No rash, pruritus, skin lesions Neurological: as above Psychiatric: No depression, insomnia, anxiety Endocrine: No palpitations, fatigue, diaphoresis, mood swings, change in appetite, change in weight, increased thirst Hematologic/Lymphatic:  No purpura,  petechiae. Allergic/Immunologic: no itchy/runny eyes, nasal congestion, recent allergic reactions, rashes  PHYSICAL EXAM: Blood pressure 112/84, pulse 84, height 5' (1.524 m), weight 144 lb (65.3 kg), SpO2 97 %. General: No acute distress.  Patient appears well-groomed.  Head:  Normocephalic/atraumatic Eyes:  Fundi examined but not visualized Neck: supple, no paraspinal tenderness, full range of motion Heart:  Regular rate and rhythm Lungs:  Clear to auscultation bilaterally Back: No paraspinal tenderness Neurological Exam: alert and oriented to person, place, and time. Attention span and concentration intact, recent and remote memory intact, fund of knowledge  intact.  Speech fluent and not dysarthric, language intact.  CN II-XII intact. Bulk and tone normal, muscle strength 5/5 throughout.  Sensation to light touch intact.  Deep tendon reflexes 2+ throughout, toes downgoing.  Finger to nose and heel to shin testing intact.  Gait normal, Romberg negative.  IMPRESSION: 1.  Left vertebral artery dissection with prior history of right vertebral artery dissection, raising suspicion of fibromuscular dysplasia.  Workup negative for vasculitis.  She does not exhibit symptoms of a connective tissue disease such as Ehlers-Danlos syndrome.  Recommend life-long ASA.   2.  Exertional headache, stable 3.  Menstrual migraines, stable   PLAN: 1.  Continue ASA 81mg  daily 2.  Recommended to avoid high-impact and strenuous activity, such as cross fit, weights, and running. 3.  Repeat CTA of neck in 2 months. 4.  Indomethacin as needed for headache. 5.  Given possibility of fibromuscular dysplasia, one must consider renal artery stenosis.  Her blood pressure has been normal so I did not order any imaging of her renal arteries.  I will confer with Dr. Beverely Lowabori for her opinion. 6.  Follow up in 4 months.  25 minutes spent face to face with patient, over 50% spent discussing diagnosis and management.  Shon MilletAdam  Laquentin Loudermilk, DO  CC: Neena RhymesKatherine Tabori, MD  09/26/18 ADDENDUM:  I have spoken with Dr. Beverely Lowabori.  We will proceed with CTA of renal arteries. Shon MilletAdam Leopoldo Mazzie, DO

## 2018-09-24 ENCOUNTER — Encounter: Payer: Self-pay | Admitting: Neurology

## 2018-09-24 ENCOUNTER — Ambulatory Visit (INDEPENDENT_AMBULATORY_CARE_PROVIDER_SITE_OTHER): Payer: BLUE CROSS/BLUE SHIELD | Admitting: Neurology

## 2018-09-24 VITALS — BP 112/84 | HR 84 | Ht 60.0 in | Wt 144.0 lb

## 2018-09-24 DIAGNOSIS — G43009 Migraine without aura, not intractable, without status migrainosus: Secondary | ICD-10-CM | POA: Diagnosis not present

## 2018-09-24 DIAGNOSIS — I7774 Dissection of vertebral artery: Secondary | ICD-10-CM | POA: Diagnosis not present

## 2018-09-24 DIAGNOSIS — Z8673 Personal history of transient ischemic attack (TIA), and cerebral infarction without residual deficits: Secondary | ICD-10-CM

## 2018-09-24 DIAGNOSIS — G4484 Primary exertional headache: Secondary | ICD-10-CM

## 2018-09-24 NOTE — Patient Instructions (Addendum)
1.  Continue aspirin 81mg  daily 2.  Avoid any strenuous or high-impact exercise or activity.   3.  May use indomethacin as needed 4.  Repeat CTA of neck in 2 months. 5.  Follow up in 4 months.  We have sent a referral to [facility] for your CTA and they will call you directly to schedule your appt. They are located at [address] [facility]. If you need to contact them directly please call [phone].

## 2018-09-25 DIAGNOSIS — R1031 Right lower quadrant pain: Secondary | ICD-10-CM | POA: Diagnosis not present

## 2018-09-26 ENCOUNTER — Telehealth: Payer: Self-pay

## 2018-09-26 DIAGNOSIS — I773 Arterial fibromuscular dysplasia: Secondary | ICD-10-CM

## 2018-09-26 NOTE — Telephone Encounter (Signed)
-----   Message from Adam R Jaffe, DO sent at 09/26/2018  7:10 AM EST ----- Sandi,  I would like to order CTA of renal arteries for Ms. Broder.  Reason:  fibromuscular dysplasia.  Let her know that I spoke to Dr. Tabori and she would like her to have this performed.  

## 2018-09-26 NOTE — Telephone Encounter (Signed)
-----   Message from Drema Dallas, DO sent at 09/26/2018  7:10 AM EST ----- Sandi,  I would like to order CTA of renal arteries for Ms. Arntson.  Reason:  fibromuscular dysplasia.  Let her know that I spoke to Dr. Beverely Low and she would like her to have this performed.

## 2018-09-26 NOTE — Telephone Encounter (Signed)
Called and advised Pt. She requested orders to be sent to [facility]  Called and spoke with Vicente Serene at [facility] to clarify which imaging study I should order to include the renal arteries. He brought Dr. Grace Isaac a IR to phone. Dr. Grace Isaac recommended abd/pelvis

## 2018-09-30 ENCOUNTER — Ambulatory Visit (INDEPENDENT_AMBULATORY_CARE_PROVIDER_SITE_OTHER): Payer: BLUE CROSS/BLUE SHIELD | Admitting: Family Medicine

## 2018-09-30 ENCOUNTER — Other Ambulatory Visit: Payer: Self-pay

## 2018-09-30 ENCOUNTER — Encounter: Payer: Self-pay | Admitting: Family Medicine

## 2018-09-30 VITALS — BP 113/80 | HR 83 | Temp 98.0°F | Resp 16 | Ht 60.0 in | Wt 143.0 lb

## 2018-09-30 DIAGNOSIS — E039 Hypothyroidism, unspecified: Secondary | ICD-10-CM | POA: Diagnosis not present

## 2018-09-30 DIAGNOSIS — Z0000 Encounter for general adult medical examination without abnormal findings: Secondary | ICD-10-CM | POA: Diagnosis not present

## 2018-09-30 DIAGNOSIS — E559 Vitamin D deficiency, unspecified: Secondary | ICD-10-CM

## 2018-09-30 NOTE — Progress Notes (Signed)
   Subjective:    Patient ID: Teresa Guzman, female    DOB: 11-02-1980, 38 y.o.   MRN: [id number]  HPI CPE- UTD on colonoscopy, sees GYN.  UTD on Tdap.   Review of Systems Patient reports no vision/ hearing changes, adenopathy,fever, weight change,  persistant/recurrent hoarseness , swallowing issues, chest pain, palpitations, edema, persistant/recurrent cough, hemoptysis, dyspnea (rest/exertional/paroxysmal nocturnal), gastrointestinal bleeding (melena, rectal bleeding), abdominal pain, significant heartburn, bowel changes, GU symptoms (dysuria, hematuria, incontinence), Gyn symptoms (abnormal  bleeding, pain),  syncope, focal weakness, memory loss, numbness & tingling, skin/hair/nail changes, abnormal bruising or bleeding, anxiety, or depression.     Objective:   Physical Exam General Appearance:    Alert, cooperative, no distress, appears stated age  Head:    Normocephalic, without obvious abnormality, atraumatic  Eyes:    PERRL, conjunctiva/corneas clear, EOM's intact, fundi    benign, both eyes  Ears:    Normal TM's and external ear canals, both ears  Nose:   Nares normal, septum midline, mucosa normal, no drainage    or sinus tenderness  Throat:   Lips, mucosa, and tongue normal; teeth and gums normal  Neck:   Supple, symmetrical, trachea midline, no adenopathy;    Thyroid: no enlargement/tenderness/nodules  Back:     Symmetric, no curvature, ROM normal, no CVA tenderness  Lungs:     Clear to auscultation bilaterally, respirations unlabored  Chest Wall:    No tenderness or deformity   Heart:    Regular rate and rhythm, S1 and S2 normal, no murmur, rub   or gallop  Breast Exam:    Deferred to GYN  Abdomen:     Soft, non-tender, bowel sounds active all four quadrants,    no masses, no organomegaly  Genitalia:    Deferred to GYN  Rectal:    Extremities:   Extremities normal, atraumatic, no cyanosis or edema  Pulses:   2+ and symmetric all extremities  Skin:   Skin color, texture,  turgor normal, no rashes or lesions  Lymph nodes:   Cervical, supraclavicular, and axillary nodes normal  Neurologic:   CNII-XII intact, normal strength, sensation and reflexes    throughout          Assessment & Plan:

## 2018-09-30 NOTE — Assessment & Plan Note (Signed)
Pt's PE WNL w/ exception of being overweight.  UTD on GYN, Tdap, colonoscopy.  Check labs.  Anticipatory guidance provided.

## 2018-09-30 NOTE — Assessment & Plan Note (Signed)
Chronic problem.  Currently asymptomatic.  Check labs and adjust meds prn. 

## 2018-09-30 NOTE — Patient Instructions (Addendum)
Follow up in 1 year or as needed We'll notify you of your lab results and make any changes if needed Continue to work on healthy diet and regular exercise- you look great! Let me know when you want to pursue the 2nd opinion Call with any questions or concerns Hang in there!  We'll figure this out!!!

## 2018-09-30 NOTE — Assessment & Plan Note (Signed)
Pt has hx of this.  Check labs and replete prn. 

## 2018-10-01 LAB — LIPID PANEL
Cholesterol: 161 mg/dL (ref 0–200)
HDL: 52.2 mg/dL (ref >39.00)
LDL Cholesterol: 84 mg/dL (ref 0–99)
NonHDL: 108.46
Total CHOL/HDL Ratio: 3
Triglycerides: 124 mg/dL (ref 0.0–149.0)
VLDL: 24.8 mg/dL (ref 0.0–40.0)

## 2018-10-01 LAB — BASIC METABOLIC PANEL WITH GFR
BUN: 19 mg/dL (ref 6–23)
CO2: 31 meq/L (ref 19–32)
Calcium: 9.3 mg/dL (ref 8.4–10.5)
Chloride: 100 meq/L (ref 96–112)
Creatinine, Ser: 0.8 mg/dL (ref 0.40–1.20)
GFR: 79.73 mL/min
Glucose, Bld: 77 mg/dL (ref 70–99)
Potassium: 4.2 meq/L (ref 3.5–5.1)
Sodium: 138 meq/L (ref 135–145)

## 2018-10-01 LAB — HEPATIC FUNCTION PANEL
ALT: 21 U/L (ref 0–35)
AST: 23 U/L (ref 0–37)
Albumin: 4.6 g/dL (ref 3.5–5.2)
Alkaline Phosphatase: 92 U/L (ref 39–117)
Bilirubin, Direct: 0.1 mg/dL (ref 0.0–0.3)
Total Bilirubin: 0.5 mg/dL (ref 0.2–1.2)
Total Protein: 7.5 g/dL (ref 6.0–8.3)

## 2018-10-01 LAB — CBC WITH DIFFERENTIAL/PLATELET
Basophils Absolute: 0 10*3/uL (ref 0.0–0.1)
Basophils Relative: 0.3 % (ref 0.0–3.0)
Eosinophils Absolute: 0.1 10*3/uL (ref 0.0–0.7)
Eosinophils Relative: 1.8 % (ref 0.0–5.0)
HCT: 42.4 % (ref 36.0–46.0)
Hemoglobin: 14.3 g/dL (ref 12.0–15.0)
Lymphocytes Relative: 37 % (ref 12.0–46.0)
Lymphs Abs: 2.1 10*3/uL (ref 0.7–4.0)
MCHC: 33.6 g/dL (ref 30.0–36.0)
MCV: 93.8 fl (ref 78.0–100.0)
Monocytes Absolute: 0.5 10*3/uL (ref 0.1–1.0)
Monocytes Relative: 8.3 % (ref 3.0–12.0)
Neutro Abs: 3 10*3/uL (ref 1.4–7.7)
Neutrophils Relative %: 52.6 % (ref 43.0–77.0)
Platelets: 283 10*3/uL (ref 150.0–400.0)
RBC: 4.52 Mil/uL (ref 3.87–5.11)
RDW: 13.1 % (ref 11.5–15.5)
WBC: 5.7 10*3/uL (ref 4.0–10.5)

## 2018-10-01 LAB — VITAMIN D 25 HYDROXY (VIT D DEFICIENCY, FRACTURES): VITD: 88.89 ng/mL (ref 30.00–100.00)

## 2018-10-01 LAB — TSH: TSH: 6.14 u[IU]/mL — ABNORMAL HIGH (ref 0.35–4.50)

## 2018-10-02 ENCOUNTER — Other Ambulatory Visit: Payer: Self-pay | Admitting: General Practice

## 2018-10-02 DIAGNOSIS — E039 Hypothyroidism, unspecified: Secondary | ICD-10-CM

## 2018-10-02 MED ORDER — LEVOTHYROXINE SODIUM 112 MCG PO TABS: 112.0000 ug | ORAL_TABLET | Freq: Every day | ORAL | 3 refills | Status: DC

## 2018-10-11 ENCOUNTER — Encounter: Payer: Self-pay | Admitting: Family Medicine

## 2018-10-11 ENCOUNTER — Ambulatory Visit
Admission: RE | Admit: 2018-10-11 | Discharge: 2018-10-11 | Disposition: A | Discharge status: Home / Self Care | Payer: BLUE CROSS/BLUE SHIELD | Source: Ambulatory Visit | Attending: Neurology | Admitting: Neurology

## 2018-10-11 ENCOUNTER — Telehealth: Payer: Self-pay | Admitting: Neurology

## 2018-10-11 DIAGNOSIS — I773 Arterial fibromuscular dysplasia: Secondary | ICD-10-CM

## 2018-10-11 DIAGNOSIS — I7774 Dissection of vertebral artery: Secondary | ICD-10-CM

## 2018-10-11 DIAGNOSIS — N838 Other noninflammatory disorders of ovary, fallopian tube and broad ligament: Secondary | ICD-10-CM | POA: Diagnosis not present

## 2018-10-11 MED ORDER — IOPAMIDOL (ISOVUE-370) INJECTION 76%
75.0000 mL | Freq: Once | INTRAVENOUS | Status: AC | PRN
  Administered 2018-10-11: 75 mL via INTRAVENOUS

## 2018-10-11 NOTE — Telephone Encounter (Signed)
Mychart message sent to patient.

## 2018-10-11 NOTE — Telephone Encounter (Signed)
-----   Message from Drema Dallas, DO sent at 10/11/2018 12:55 PM EST ----- Teresa Guzman, CTA of abdomen and pelvis reveals no evidence of fibromuscular dysplasia of the renal arteries.  Incidentally, it shows that the ovarian cysts previously seen on MRI in 2011 are enlarged.  I recommend that she address this with Dr. Beverely Low.  I will route this to Dr. Beverely Low as well.

## 2018-10-14 ENCOUNTER — Other Ambulatory Visit: Payer: Self-pay | Admitting: Physician Assistant

## 2018-10-14 DIAGNOSIS — J208 Acute bronchitis due to other specified organisms: Principal | ICD-10-CM

## 2018-10-14 DIAGNOSIS — R062 Wheezing: Secondary | ICD-10-CM

## 2018-10-14 DIAGNOSIS — B9689 Other specified bacterial agents as the cause of diseases classified elsewhere: Secondary | ICD-10-CM

## 2018-10-15 DIAGNOSIS — R1031 Right lower quadrant pain: Secondary | ICD-10-CM | POA: Diagnosis not present

## 2018-10-15 DIAGNOSIS — N801 Endometriosis of ovary: Secondary | ICD-10-CM | POA: Diagnosis not present

## 2018-10-29 ENCOUNTER — Encounter: Payer: Self-pay | Admitting: Family Medicine

## 2018-10-31 ENCOUNTER — Other Ambulatory Visit: Payer: Self-pay

## 2018-10-31 ENCOUNTER — Other Ambulatory Visit (INDEPENDENT_AMBULATORY_CARE_PROVIDER_SITE_OTHER): Payer: BLUE CROSS/BLUE SHIELD

## 2018-10-31 DIAGNOSIS — E039 Hypothyroidism, unspecified: Secondary | ICD-10-CM

## 2018-11-01 LAB — TSH: TSH: 4.01 u[IU]/mL (ref 0.35–4.50)

## 2018-11-06 ENCOUNTER — Encounter (HOSPITAL_COMMUNITY): Payer: Self-pay

## 2018-11-06 NOTE — Patient Instructions (Signed)
Teresa Guzman    Your procedure is scheduled on: 11/14/2018   Report to [facility] Main  Entrance  Report to admitting at 11:00 am    Call this number if you have problems the morning of surgery [phone]    Remember:. BRUSH YOUR TEETH MORNING OF SURGERY AND RINSE YOUR MOUTH OUT, NO CHEWING GUM CANDY OR MINTS.      CLEAR LIQUID DIET   Foods Allowed                                                                     Foods Excluded  Coffee and tea, regular and decaf                             liquids that you cannot  Plain Jell-O in any flavor                                             see through such as: Fruit ices (not with fruit pulp)                                     milk, soups, orange juice  Iced Popsicles                                    All solid food Carbonated beverages, regular and diet                                    Cranberry, grape and apple juices Sports drinks like Gatorade Lightly seasoned clear broth or consume(fat free) Sugar, honey syrup  Sample Menu Breakfast                                Lunch                                     Supper Cranberry juice                    Beef broth                            Chicken broth Jell-O                                     Grape juice                           Apple juice Coffee or tea  Jell-O                                      Popsicle                                                Coffee or tea                        Coffee or tea  _____________________________________________________________________    Take these medicines the morning of surgery with A SIP OF WATER: Levothyroxine(Synthroid), Loratadine(Claritin),Norlyda, and  Pro air HFA inhaler, Azelastine HCL spray,Fluticasone(Flonase).                                 You may not have any metal on your body including hair pins and              piercings  Do not wear jewelry, make-up, lotions,  powders or perfumes, deodorant             Do not wear nail polish.  Do not shave  48 hours prior to surgery .              - Preparing for Surgery  Before surgery, you can play an important role.  Because skin is not sterile, your skin needs to be as free of germs as possible.  You can reduce the number of germs on your skin by washing with CHG (chlorahexidine gluconate) soap before surgery.  CHG is an antiseptic cleaner which kills germs and bonds with the skin to continue killing germs even after washing. Please DO NOT use if you have an allergy to CHG or antibacterial soaps.  If your skin becomes reddened/irritated stop using the CHG and inform your nurse when you arrive at Short Stay. Do not shave (including legs and underarms) for at least 48 hours prior to the first CHG shower.  You may shave your face/neck. Please follow these instructions carefully:   1.  Shower with CHG Soap the night before surgery and the  morning of Surgery.  2.  If you choose to wash your hair, wash your hair first as usual with your  normal  shampoo.  3.  After you shampoo, rinse your hair and body thoroughly to remove the  shampoo.                           4.  Use CHG as you would any other liquid soap.  You can apply chg directly  to the skin and wash                       Gently with a scrungie or clean washcloth.  5.  Apply the CHG Soap to your body ONLY FROM THE NECK DOWN.   Do not use on face/ open                           Wound or open sores. Avoid contact with eyes, ears mouth and genitals (private parts).  Wash face,  Genitals (private parts) with your normal soap.             6.  Wash thoroughly, paying special attention to the area where your surgery  will be performed.  7.  Thoroughly rinse your body with warm water from the neck down.  8.  DO NOT shower/wash with your normal soap after using and rinsing off  the CHG Soap.                9.  Pat yourself dry with a clean towel.             10.  Wear clean pajamas.            11.  Place clean sheets on your bed the night of your first shower and  do not  sleep with pets.  Day of Surgery :  Do not apply any lotions/deodorants the morning of surgery.  Please wear clean clothes to the hospital/surgery center.  FAILURE TO FOLLOW THESE INSTRUCTIONS MAY RESULT IN THE CANCELLATION OF YOUR SURGERY PATIENT SIGNATURE_________________________________  NURSE SIGNATURE__________________________________  ________________________________________________________________________   Do not bring valuables to the hospital. [facility] IS NOT             RESPONSIBLE   FOR VALUABLES.  Contacts, dentures or bridgework may not be worn into surgery.         Special Instructions: N/A              Please read over the following fact sheets you were given: _____________________________________________________________________

## 2018-11-07 ENCOUNTER — Encounter (HOSPITAL_COMMUNITY): Payer: Self-pay

## 2018-11-07 ENCOUNTER — Other Ambulatory Visit: Payer: Self-pay | Admitting: Urology

## 2018-11-07 ENCOUNTER — Other Ambulatory Visit: Payer: Self-pay

## 2018-11-07 ENCOUNTER — Other Ambulatory Visit: Payer: Self-pay | Admitting: Obstetrics & Gynecology

## 2018-11-07 ENCOUNTER — Encounter (HOSPITAL_COMMUNITY)
Admission: RE | Admit: 2018-11-07 | Discharge: 2018-11-07 | Disposition: A | Discharge status: Home / Self Care | Payer: BLUE CROSS/BLUE SHIELD | Source: Ambulatory Visit | Attending: Obstetrics & Gynecology | Admitting: Obstetrics & Gynecology

## 2018-11-07 DIAGNOSIS — Z8673 Personal history of transient ischemic attack (TIA), and cerebral infarction without residual deficits: Secondary | ICD-10-CM | POA: Insufficient documentation

## 2018-11-07 DIAGNOSIS — Z01818 Encounter for other preprocedural examination: Secondary | ICD-10-CM | POA: Diagnosis not present

## 2018-11-07 LAB — CBC
HCT: 40.3 % (ref 36.0–46.0)
Hemoglobin: 13.2 g/dL (ref 12.0–15.0)
MCH: 31.4 pg (ref 26.0–34.0)
MCHC: 32.8 g/dL (ref 30.0–36.0)
MCV: 96 fL (ref 80.0–100.0)
Platelets: 259 10*3/uL (ref 150–400)
RBC: 4.2 MIL/uL (ref 3.87–5.11)
RDW: 12.6 % (ref 11.5–15.5)
WBC: 6.3 10*3/uL (ref 4.0–10.5)
nRBC: 0 % (ref 0.0–0.2)

## 2018-11-07 LAB — BASIC METABOLIC PANEL
Anion gap: 6 (ref 5–15)
BUN: 12 mg/dL (ref 6–20)
CO2: 26 mmol/L (ref 22–32)
Calcium: 8.7 mg/dL — ABNORMAL LOW (ref 8.9–10.3)
Chloride: 104 mmol/L (ref 98–111)
Creatinine, Ser: 0.75 mg/dL (ref 0.44–1.00)
GFR calc Af Amer: >60 mL/min (ref >60)
GFR calc non Af Amer: >60 mL/min (ref >60)
Glucose, Bld: 95 mg/dL (ref 70–99)
Potassium: 4.1 mmol/L (ref 3.5–5.1)
Sodium: 136 mmol/L (ref 135–145)

## 2018-11-08 NOTE — Progress Notes (Signed)
Anesthesia Chart Review   Case:  [id number] Date/Time:  11/14/18 1045   Procedures:      XI ROBOTIC ASSISTED LAPAROSCOPIC HYSTERECTOMY AND SALPINGECTOMY/Right Oophorectomy/Possible Left Oophorectomy/Possible Laparotomy (Bilateral )     CYSTOSCOPY WITH STENT PLACEMENT (Bilateral )   Anesthesia type:  General   Pre-op diagnosis:  Endometriosis   Location:  [facility] ROOM 02 / [facility]   Surgeon:  Shea Evans, MD; Bjorn Pippin, MD      DISCUSSION:37 yo never smoker with h/o bipolar disorder, depression, asthma, stroke due to vertebral artery dissection 09/2015, migraines, anemia, hypothyroidism, endometriosis scheduled for above procedure with Dr. Shea Evans and Dr. Bjorn Pippin.   VS: BP 123/73   Pulse 88   Temp 36.6 C (Oral)   Resp 16   Ht 5' (1.524 m)   Wt 65.2 kg   LMP 05/08/2018   SpO2 100%   BMI 28.08 kg/m   PROVIDERS: Sheliah Hatch, MD is PCP last seen 09/30/18  Shon Millet, DO is Neurologist  LABS: Labs reviewed: Acceptable for surgery. (all labs ordered are listed, but only abnormal results are displayed)  Labs Reviewed  BASIC METABOLIC PANEL - Abnormal; Notable for the following components:      Result Value   Calcium 8.7 (*)    All other components within normal limits  CBC     IMAGES: CT Angio Abd/Pelvis 10/11/2018 IMPRESSION: VASCULAR  No acute vascular pathology. No evidence of fibromuscular dysplasia.  NON-VASCULAR  Uterus and adnexa are abnormal in appearance. A cystic lesion in the right adnexa has markedly enlarged compared to the prior study, now measuring 7.4 x 3.6 cm. Ovarian malignancy is not excluded. Surgical consultation is recommended.  The cystic lesion in the left adnexa has resolved.  The uterus also has an abnormal appearance without normal morphology. Ultrasound is recommended.  EKG: 11/07/2018 Rate 88 bpm Normal sinus rhythm  Normal ECG Since previous tracing rate slower Otherwise no significant change.   CV: Echo  09/10/2015 Study Conclusions  - Left ventricle: The cavity size was normal. Wall thickness was   normal. Systolic function was normal. The estimated ejection   fraction was in the range of 60% to 65%. Wall motion was normal;   there were no regional wall motion abnormalities. Past Medical History:  Diagnosis Date  . Anemia   . Asthma    childhood asthma - no inhaler, no problems as adult  . Bipolar disorder (HCC)    dx while in college, no current meds  . Depression    no current meds  . Endometriosis   . Headache    HX  MIGRAINES During menstrual cycle  . Hypothyroidism childhood  . Infertility, female   . Seasonal allergies   . Stroke (HCC) 09/09/15   hadright cerebellar infarct secondary to right vertebral artery dissection-no deficits, no treatment, no problems since 09/2015  . Vertebral artery dissection (HCC) 2017    Past Surgical History:  Procedure Laterality Date  . ANTERIOR CERVICAL DECOMP/DISCECTOMY FUSION N/A 03/08/2017   Procedure: ANTERIOR CERVICAL DECOMPRESSION/DISCECTOMY FUSION C5-6;  Surgeon: Venita Lick, MD;  Location: [facility] OR;  Service: Orthopedics;  Laterality: N/A;  3 hours  . arm surgery Left 1987  . CHOLECYSTECTOMY  10/2017   [place].  . COLONOSCOPY  06/2017   at [facility]  . CYST REMOVAL HAND     CYST REMOVED OFF OVARY - Not Hand  . DILITATION & CURRETTAGE/HYSTROSCOPY WITH NOVASURE ABLATION N/A 12/13/2017   Procedure: DILATATION & CURETTAGE/HYSTEROSCOPY WITH  HYDROTHERMAL   ABLATION;  Surgeon: Shea Evans, MD;  Location: [facility];  Service: Gynecology;  Laterality: N/A;  . LAPAROSCOPIC OVARIAN CYSTECTOMY Bilateral 12/13/2017   Procedure: LAPAROSCOPY WITH RIGHT OVARIAN CYSTECTOMY, ENDOMETRIOSIS ABLATION, AND EXCISION OF LEFT URETERAL IMPLANT;  Surgeon: Shea Evans, MD;  Location: [facility];  Service: Gynecology;  Laterality: Bilateral;  . OVARIAN CYST REMOVAL  2011   [place] abdominal excision  . WISDOM TOOTH EXTRACTION      MEDICATIONS: . aspirin EC  81 MG tablet  . Azelastine HCl 0.15 % SOLN  . ferrous sulfate 325 (65 FE) MG tablet  . fluticasone (FLONASE) 50 MCG/ACT nasal spray  . indomethacin (INDOCIN) 50 MG capsule  . levothyroxine (SYNTHROID, LEVOTHROID) 112 MCG tablet  . loratadine-pseudoephedrine (CLARITIN-D 24-HOUR) 10-240 MG 24 hr tablet  . Multiple Vitamins-Minerals (ADULT ONE DAILY GUMMIES PO)  . NORLYDA 0.35 MG tablet  . PROAIR HFA 108 (90 Base) MCG/ACT inhaler  . VITAMIN D PO   No current facility-administered medications for this encounter.     Janey Genta [facility] Pre-Surgical Testing [phone] 11/08/18 10:21 AM

## 2018-11-13 NOTE — H&P (Addendum)
Teresa Guzman is an 38 y.o. female G0, with pelvic endometriosis, here for surgery.  Known endometriosis, L/scopy 2013, and again in May'19 with large right ovarian endometrioma excision and ablation/ excision of endometriotic implants. She underwent HTA endometrial ablation same time. Since then [facility] but didn't start due to fear of hormones. Her menses stopped with ablation but she has been having cramping and RLQ pain since Feb'20.  Pelvic CT and pelvic sono noted right ovarian complex cyst 6x6x5 cm with internal echoes and small cyst left ovary. Pain medications are not working, she is having nausea and vomiting with cyclic pain, esp worse on right and wants surgical intervention. Prior L/scopy from May'19 images and Op note reviewed and recommended bilateral ureteral implants since right ureteral implant excised at last surgery.  Pertinent Gynecological History: Menses: no menses since ablation but cramping since 2 mos in cyclic fashion Sexually transmitted diseases: no past history Previous GYN Procedures: Laparoscopy x 2 - both for endometriosis, D&C and H/scopy and HTA endometrial ablation  Last pap: normal OB History: G0. Has adopted 2 kids and fosters 4 kids    No LMP recorded. Patient has had an ablation.    Past Medical History:  Diagnosis Date  . Anemia   . Asthma    childhood asthma - no inhaler, no problems as adult  . Bipolar disorder (HCC)    dx while in college, no current meds  . Depression    no current meds  . Endometriosis   . Headache    HX  MIGRAINES During menstrual cycle  . Hypothyroidism childhood  . Infertility, female   . Seasonal allergies   . Stroke (HCC) 09/09/15   hadright cerebellar infarct secondary to right vertebral artery dissection-no deficits, no treatment, no problems since 09/2015  . Vertebral artery dissection (HCC) 2017    Past Surgical History:  Procedure Laterality Date  . ANTERIOR CERVICAL  DECOMP/DISCECTOMY FUSION N/A 03/08/2017   Procedure: ANTERIOR CERVICAL DECOMPRESSION/DISCECTOMY FUSION C5-6;  Surgeon: Venita Lick, MD;  Location: [facility] OR;  Service: Orthopedics;  Laterality: N/A;  3 hours  . arm surgery Left 1987  . CHOLECYSTECTOMY  10/2017   [place].  . COLONOSCOPY  06/2017   at [facility]  . CYST REMOVAL HAND     CYST REMOVED OFF OVARY - Not Hand  . DILITATION & CURRETTAGE/HYSTROSCOPY WITH NOVASURE ABLATION N/A 12/13/2017   Procedure: DILATATION & CURETTAGE/HYSTEROSCOPY WITH HYDROTHERMAL   ABLATION;  Surgeon: Shea Evans, MD;  Location: [facility];  Service: Gynecology;  Laterality: N/A;  . LAPAROSCOPIC OVARIAN CYSTECTOMY Bilateral 12/13/2017   Procedure: LAPAROSCOPY WITH RIGHT OVARIAN CYSTECTOMY, ENDOMETRIOSIS ABLATION, AND EXCISION OF LEFT URETERAL IMPLANT;  Surgeon: Shea Evans, MD;  Location: [facility];  Service: Gynecology;  Laterality: Bilateral;  . OVARIAN CYST REMOVAL  2011   [place] abdominal excision  . WISDOM TOOTH EXTRACTION      Family History  Problem Relation Age of Onset  . Colon cancer Brother 33       chemo, sx   . Hyperlipidemia Mother   . Hypertension Father   . Hyperlipidemia Father   . Heart attack Father   . Diabetes Father     Social History:  reports that she has never smoked. She has never used smokeless tobacco. She reports that she does not drink alcohol or use drugs.  Allergies:  Allergies  Allergen Reactions  . Morphine And Related Other (See Comments)    Headache for  a long time per pt  . Neosporin [Neomycin-Bacitracin Zn-Polymyx] Rash    No medications prior to admission.    ROS nausea, vomiting. Severe RLQ pain and cramping without a period   There were no vitals taken for this visit. Physical Exam  A&O x 3, no acute distress. Pleasant HEENT neg, no thyromegaly Lungs CTA bilat CV RRR, S1S2 normal Abdo soft, non acute, RLQ tenderness  Extr no edema/ tenderness Pelvic Uterus AV, bulky, right adnexal tenderness    CBC CBC Latest Ref Rng & Units 11/07/2018 09/30/2018  WBC 4.0 - 10.5 K/uL 6.3 5.7  Hemoglobin 12.0 - 15.0 g/dL 16.113.2 09.614.3  Hematocrit 04.536.0 - 46.0 % 40.3 42.4  Platelets 150 - 400 K/uL 259 283.0    Assessment/Plan: 38 yo female with pelvic endometriosis and recurrent right ovarian endometrioma with pain, nausea/ vomiting, desires definite surgery. Plan daVinci robot assisted total laparoscopic hysterectomy, bilateral salpingectomy and right oophorectomy. Save left ovary since age is [age].  Reviewed hysterectomy, pt is s/p endometrial ablation, cyclic pain may continue as post ablation syndrome and may have return of menses so, proceed with hysterectomy. Ureteral implant (Right) was excised at last surgery, recommend bilateral ureteral stents to assess ureters during surgery to prevent injury .  Risks/complications of surgery reviewed incl infection, bleeding, damage to internal organs including bladder, bowels, ureters, blood vessels, other risks from anesthesia, VTE and delayed complications of any surgery, complications in future surgery reviewed.  Robley FriesVaishali R Tanequa Kretz 11/13/2018, 8:35 PM   Addendum--> 11/14/18 Pt seen and reviewed H&P and plan- patient wants hystererectomy and BSO and does not want to deal with left ovary having cyst again. Reviewed menopause and need for ERT, agrees.  V.Lelaina Oatis, MD

## 2018-11-14 ENCOUNTER — Ambulatory Visit (HOSPITAL_COMMUNITY): Payer: BLUE CROSS/BLUE SHIELD

## 2018-11-14 ENCOUNTER — Ambulatory Visit (HOSPITAL_COMMUNITY): Payer: BLUE CROSS/BLUE SHIELD | Admitting: Physician Assistant

## 2018-11-14 ENCOUNTER — Encounter (HOSPITAL_COMMUNITY): Payer: Self-pay

## 2018-11-14 ENCOUNTER — Observation Stay (HOSPITAL_COMMUNITY)
Admission: RE | Admit: 2018-11-14 | Discharge: 2018-11-15 | Disposition: A | Discharge status: Home / Self Care | Payer: BLUE CROSS/BLUE SHIELD | Attending: Obstetrics & Gynecology | Admitting: Obstetrics & Gynecology

## 2018-11-14 ENCOUNTER — Other Ambulatory Visit: Payer: Self-pay

## 2018-11-14 ENCOUNTER — Encounter (HOSPITAL_COMMUNITY)
Admission: RE | Disposition: A | Discharge status: Home / Self Care | Payer: Self-pay | Source: Home / Self Care | Attending: Obstetrics & Gynecology

## 2018-11-14 DIAGNOSIS — Z8673 Personal history of transient ischemic attack (TIA), and cerebral infarction without residual deficits: Secondary | ICD-10-CM | POA: Diagnosis not present

## 2018-11-14 DIAGNOSIS — R102 Pelvic and perineal pain: Secondary | ICD-10-CM | POA: Diagnosis not present

## 2018-11-14 DIAGNOSIS — N801 Endometriosis of ovary: Secondary | ICD-10-CM | POA: Diagnosis not present

## 2018-11-14 DIAGNOSIS — D649 Anemia, unspecified: Secondary | ICD-10-CM | POA: Diagnosis not present

## 2018-11-14 DIAGNOSIS — Z7951 Long term (current) use of inhaled steroids: Secondary | ICD-10-CM | POA: Insufficient documentation

## 2018-11-14 DIAGNOSIS — J45909 Unspecified asthma, uncomplicated: Secondary | ICD-10-CM | POA: Insufficient documentation

## 2018-11-14 DIAGNOSIS — Z7982 Long term (current) use of aspirin: Secondary | ICD-10-CM | POA: Diagnosis not present

## 2018-11-14 DIAGNOSIS — Z9079 Acquired absence of other genital organ(s): Secondary | ICD-10-CM

## 2018-11-14 DIAGNOSIS — N920 Excessive and frequent menstruation with regular cycle: Secondary | ICD-10-CM | POA: Diagnosis not present

## 2018-11-14 DIAGNOSIS — N802 Endometriosis of fallopian tube: Secondary | ICD-10-CM | POA: Diagnosis not present

## 2018-11-14 DIAGNOSIS — Z79890 Hormone replacement therapy (postmenopausal): Secondary | ICD-10-CM | POA: Insufficient documentation

## 2018-11-14 DIAGNOSIS — N858 Other specified noninflammatory disorders of uterus: Secondary | ICD-10-CM | POA: Diagnosis not present

## 2018-11-14 DIAGNOSIS — N888 Other specified noninflammatory disorders of cervix uteri: Secondary | ICD-10-CM | POA: Diagnosis not present

## 2018-11-14 DIAGNOSIS — N809 Endometriosis, unspecified: Secondary | ICD-10-CM | POA: Diagnosis not present

## 2018-11-14 DIAGNOSIS — E039 Hypothyroidism, unspecified: Secondary | ICD-10-CM | POA: Diagnosis not present

## 2018-11-14 DIAGNOSIS — K660 Peritoneal adhesions (postprocedural) (postinfection): Secondary | ICD-10-CM | POA: Diagnosis not present

## 2018-11-14 DIAGNOSIS — Z9889 Other specified postprocedural states: Secondary | ICD-10-CM | POA: Insufficient documentation

## 2018-11-14 DIAGNOSIS — N736 Female pelvic peritoneal adhesions (postinfective): Secondary | ICD-10-CM | POA: Diagnosis not present

## 2018-11-14 DIAGNOSIS — Z90722 Acquired absence of ovaries, bilateral: Secondary | ICD-10-CM

## 2018-11-14 DIAGNOSIS — Z90710 Acquired absence of both cervix and uterus: Secondary | ICD-10-CM

## 2018-11-14 DIAGNOSIS — Z79899 Other long term (current) drug therapy: Secondary | ICD-10-CM | POA: Insufficient documentation

## 2018-11-14 DIAGNOSIS — Z408 Encounter for other prophylactic surgery: Secondary | ICD-10-CM | POA: Diagnosis not present

## 2018-11-14 HISTORY — PX: ROBOTIC ASSISTED LAPAROSCOPIC HYSTERECTOMY AND SALPINGECTOMY: SHX6379

## 2018-11-14 HISTORY — PX: CYSTOSCOPY WITH STENT PLACEMENT: SHX5790

## 2018-11-14 LAB — CBC
HCT: 44.6 % (ref 36.0–46.0)
Hemoglobin: 15 g/dL (ref 12.0–15.0)
MCH: 31.7 pg (ref 26.0–34.0)
MCHC: 33.6 g/dL (ref 30.0–36.0)
MCV: 94.3 fL (ref 80.0–100.0)
Platelets: 253 10*3/uL (ref 150–400)
RBC: 4.73 MIL/uL (ref 3.87–5.11)
RDW: 12.6 % (ref 11.5–15.5)
WBC: 6.3 10*3/uL (ref 4.0–10.5)
nRBC: 0 % (ref 0.0–0.2)

## 2018-11-14 LAB — COMPREHENSIVE METABOLIC PANEL
ALT: 40 U/L (ref 0–44)
AST: 77 U/L — ABNORMAL HIGH (ref 15–41)
Albumin: 4.6 g/dL (ref 3.5–5.0)
Alkaline Phosphatase: 109 U/L (ref 38–126)
Anion gap: 7 (ref 5–15)
BUN: 13 mg/dL (ref 6–20)
CO2: 25 mmol/L (ref 22–32)
Calcium: 8.9 mg/dL (ref 8.9–10.3)
Chloride: 105 mmol/L (ref 98–111)
Creatinine, Ser: 0.87 mg/dL (ref 0.44–1.00)
GFR calc Af Amer: >60 mL/min (ref >60)
GFR calc non Af Amer: >60 mL/min (ref >60)
Glucose, Bld: 87 mg/dL (ref 70–99)
Potassium: 4.1 mmol/L (ref 3.5–5.1)
Sodium: 137 mmol/L (ref 135–145)
Total Bilirubin: 1.1 mg/dL (ref 0.3–1.2)
Total Protein: 8.4 g/dL — ABNORMAL HIGH (ref 6.5–8.1)

## 2018-11-14 LAB — PREGNANCY, URINE: Preg Test, Ur: NEGATIVE

## 2018-11-14 LAB — ABO/RH: ABO/RH(D): A POS

## 2018-11-14 LAB — TYPE AND SCREEN
ABO/RH(D): A POS
Antibody Screen: NEGATIVE

## 2018-11-14 SURGERY — XI ROBOTIC ASSISTED LAPAROSCOPIC HYSTERECTOMY AND SALPINGECTOMY
Anesthesia: General | Laterality: Bilateral

## 2018-11-14 MED ORDER — ROCURONIUM BROMIDE 10 MG/ML (PF) SYRINGE
PREFILLED_SYRINGE | INTRAVENOUS | Status: DC | PRN
  Administered 2018-11-14: 20 mg via INTRAVENOUS
  Administered 2018-11-14: 10 mg via INTRAVENOUS
  Administered 2018-11-14: 40 mg via INTRAVENOUS
  Administered 2018-11-14: 10 mg via INTRAVENOUS

## 2018-11-14 MED ORDER — CEFAZOLIN SODIUM-DEXTROSE 2-4 GM/100ML-% IV SOLN
2.0000 g | INTRAVENOUS | Status: AC
  Administered 2018-11-14: 2 g via INTRAVENOUS
  Filled 2018-11-14: qty 100

## 2018-11-14 MED ORDER — ONDANSETRON HCL 4 MG/2ML IJ SOLN
INTRAMUSCULAR | Status: AC
  Filled 2018-11-14: qty 2

## 2018-11-14 MED ORDER — DEXAMETHASONE SODIUM PHOSPHATE 10 MG/ML IJ SOLN
INTRAMUSCULAR | Status: AC
  Filled 2018-11-14: qty 1

## 2018-11-14 MED ORDER — KETAMINE HCL 10 MG/ML IJ SOLN
INTRAMUSCULAR | Status: AC
  Filled 2018-11-14: qty 1

## 2018-11-14 MED ORDER — ACETAMINOPHEN 500 MG PO TABS
1000.0000 mg | ORAL_TABLET | ORAL | Status: AC
  Administered 2018-11-14: 11:00:00 1000 mg via ORAL

## 2018-11-14 MED ORDER — PROPOFOL 10 MG/ML IV BOLUS
INTRAVENOUS | Status: DC | PRN
  Administered 2018-11-14: 150 mg via INTRAVENOUS

## 2018-11-14 MED ORDER — LORATADINE-PSEUDOEPHEDRINE ER 10-240 MG PO TB24: 1.0000 | ORAL_TABLET | Freq: Every day | ORAL | Status: DC

## 2018-11-14 MED ORDER — SUGAMMADEX SODIUM 200 MG/2ML IV SOLN
INTRAVENOUS | Status: AC
  Filled 2018-11-14: qty 2

## 2018-11-14 MED ORDER — ONDANSETRON HCL 4 MG/2ML IJ SOLN
INTRAMUSCULAR | Status: DC | PRN
  Administered 2018-11-14: 4 mg via INTRAVENOUS

## 2018-11-14 MED ORDER — LIDOCAINE 2% (20 MG/ML) 5 ML SYRINGE
INTRAMUSCULAR | Status: DC | PRN
  Administered 2018-11-14: 1.5 mg/kg/h via INTRAVENOUS

## 2018-11-14 MED ORDER — EPHEDRINE 5 MG/ML INJ
INTRAVENOUS | Status: AC
  Filled 2018-11-14: qty 10

## 2018-11-14 MED ORDER — LEVOTHYROXINE SODIUM 112 MCG PO TABS
112.0000 ug | ORAL_TABLET | Freq: Every day | ORAL | Status: DC
  Administered 2018-11-15: 06:00:00 112 ug via ORAL
  Filled 2018-11-14: qty 1

## 2018-11-14 MED ORDER — ROPIVACAINE HCL 5 MG/ML IJ SOLN
INTRAMUSCULAR | Status: DC | PRN
  Administered 2018-11-14: 30 mL

## 2018-11-14 MED ORDER — LIDOCAINE 2% (20 MG/ML) 5 ML SYRINGE
INTRAMUSCULAR | Status: AC
  Filled 2018-11-14: qty 5

## 2018-11-14 MED ORDER — GABAPENTIN 300 MG PO CAPS
ORAL_CAPSULE | ORAL | Status: AC
  Filled 2018-11-14: qty 1

## 2018-11-14 MED ORDER — FLUTICASONE PROPIONATE 50 MCG/ACT NA SUSP
1.0000 | Freq: Two times a day (BID) | NASAL | Status: DC
  Administered 2018-11-15: 08:00:00 1 via NASAL
  Filled 2018-11-14: qty 16

## 2018-11-14 MED ORDER — FENTANYL CITRATE (PF) 250 MCG/5ML IJ SOLN
INTRAMUSCULAR | Status: DC | PRN
  Administered 2018-11-14: 25 ug via INTRAVENOUS
  Administered 2018-11-14 (×2): 50 ug via INTRAVENOUS
  Administered 2018-11-14: 100 ug via INTRAVENOUS

## 2018-11-14 MED ORDER — CELECOXIB 200 MG PO CAPS
ORAL_CAPSULE | ORAL | Status: AC
  Filled 2018-11-14: qty 1

## 2018-11-14 MED ORDER — SODIUM CHLORIDE 0.9 % IR SOLN
Status: DC | PRN
  Administered 2018-11-14: 1000 mL via INTRAVESICAL

## 2018-11-14 MED ORDER — PSEUDOEPHEDRINE HCL ER 120 MG PO TB12
120.0000 mg | ORAL_TABLET | Freq: Two times a day (BID) | ORAL | Status: DC
  Filled 2018-11-14 (×2): qty 1

## 2018-11-14 MED ORDER — SUGAMMADEX SODIUM 200 MG/2ML IV SOLN
INTRAVENOUS | Status: DC | PRN
  Administered 2018-11-14: 130 mg via INTRAVENOUS

## 2018-11-14 MED ORDER — MIDAZOLAM HCL 2 MG/2ML IJ SOLN
INTRAMUSCULAR | Status: DC | PRN
  Administered 2018-11-14: 2 mg via INTRAVENOUS

## 2018-11-14 MED ORDER — IBUPROFEN 200 MG PO TABS
600.0000 mg | ORAL_TABLET | Freq: Three times a day (TID) | ORAL | Status: DC
  Administered 2018-11-15: 06:00:00 600 mg via ORAL
  Filled 2018-11-14: qty 3

## 2018-11-14 MED ORDER — AZELASTINE HCL 0.1 % NA SOLN
1.0000 | Freq: Every day | NASAL | Status: DC
  Filled 2018-11-14: qty 30

## 2018-11-14 MED ORDER — DEXAMETHASONE SODIUM PHOSPHATE 10 MG/ML IJ SOLN
INTRAMUSCULAR | Status: DC | PRN
  Administered 2018-11-14: 6 mg via INTRAVENOUS

## 2018-11-14 MED ORDER — MIDAZOLAM HCL 2 MG/2ML IJ SOLN
INTRAMUSCULAR | Status: AC
  Filled 2018-11-14: qty 2

## 2018-11-14 MED ORDER — GABAPENTIN 300 MG PO CAPS
300.0000 mg | ORAL_CAPSULE | ORAL | Status: AC
  Administered 2018-11-14: 11:00:00 300 mg via ORAL

## 2018-11-14 MED ORDER — SCOPOLAMINE 1 MG/3DAYS TD PT72: 1.0000 | MEDICATED_PATCH | TRANSDERMAL | Status: DC

## 2018-11-14 MED ORDER — KETAMINE HCL 10 MG/ML IJ SOLN
INTRAMUSCULAR | Status: DC | PRN
  Administered 2018-11-14: 25 mg via INTRAVENOUS

## 2018-11-14 MED ORDER — MENTHOL 3 MG MT LOZG: 1.0000 | LOZENGE | OROMUCOSAL | Status: DC | PRN

## 2018-11-14 MED ORDER — LORATADINE 10 MG PO TABS: 10.0000 mg | ORAL_TABLET | Freq: Every day | ORAL | Status: DC

## 2018-11-14 MED ORDER — LACTATED RINGERS IV SOLN
INTRAVENOUS | Status: DC
  Administered 2018-11-14: 11:00:00 via INTRAVENOUS

## 2018-11-14 MED ORDER — ACETAMINOPHEN 325 MG PO TABS
650.0000 mg | ORAL_TABLET | ORAL | Status: DC | PRN
  Administered 2018-11-14: 22:00:00 650 mg via ORAL
  Filled 2018-11-14: qty 2

## 2018-11-14 MED ORDER — SODIUM CHLORIDE (PF) 0.9 % IJ SOLN
INTRAMUSCULAR | Status: AC
  Filled 2018-11-14: qty 50

## 2018-11-14 MED ORDER — ALBUTEROL SULFATE (2.5 MG/3ML) 0.083% IN NEBU: 2.5000 mg | INHALATION_SOLUTION | Freq: Four times a day (QID) | RESPIRATORY_TRACT | Status: DC | PRN

## 2018-11-14 MED ORDER — ONDANSETRON HCL 4 MG PO TABS: 4.0000 mg | ORAL_TABLET | Freq: Four times a day (QID) | ORAL | Status: DC | PRN

## 2018-11-14 MED ORDER — FENTANYL CITRATE (PF) 100 MCG/2ML IJ SOLN: 25.0000 ug | INTRAMUSCULAR | Status: DC | PRN

## 2018-11-14 MED ORDER — SODIUM CHLORIDE (PF) 0.9 % IJ SOLN
INTRAMUSCULAR | Status: DC | PRN
  Administered 2018-11-14: 30 mL

## 2018-11-14 MED ORDER — PROPOFOL 10 MG/ML IV BOLUS
INTRAVENOUS | Status: AC
  Filled 2018-11-14: qty 20

## 2018-11-14 MED ORDER — CELECOXIB 200 MG PO CAPS
200.0000 mg | ORAL_CAPSULE | ORAL | Status: AC
  Administered 2018-11-14: 11:00:00 200 mg via ORAL

## 2018-11-14 MED ORDER — FENTANYL CITRATE (PF) 250 MCG/5ML IJ SOLN
INTRAMUSCULAR | Status: AC
  Filled 2018-11-14: qty 5

## 2018-11-14 MED ORDER — KETOROLAC TROMETHAMINE 30 MG/ML IJ SOLN
INTRAMUSCULAR | Status: AC
  Filled 2018-11-14: qty 1

## 2018-11-14 MED ORDER — DIPHENHYDRAMINE HCL 50 MG/ML IJ SOLN
INTRAMUSCULAR | Status: DC | PRN
  Administered 2018-11-14: 12.5 mg via INTRAVENOUS

## 2018-11-14 MED ORDER — ROCURONIUM BROMIDE 10 MG/ML (PF) SYRINGE
PREFILLED_SYRINGE | INTRAVENOUS | Status: AC
  Filled 2018-11-14: qty 10

## 2018-11-14 MED ORDER — ACETAMINOPHEN 500 MG PO TABS
ORAL_TABLET | ORAL | Status: AC
  Filled 2018-11-14: qty 2

## 2018-11-14 MED ORDER — SUCCINYLCHOLINE CHLORIDE 200 MG/10ML IV SOSY
PREFILLED_SYRINGE | INTRAVENOUS | Status: AC
  Filled 2018-11-14: qty 20

## 2018-11-14 MED ORDER — ASPIRIN EC 81 MG PO TBEC
81.0000 mg | DELAYED_RELEASE_TABLET | Freq: Every day | ORAL | Status: DC
  Administered 2018-11-15: 09:00:00 81 mg via ORAL
  Filled 2018-11-14: qty 1

## 2018-11-14 MED ORDER — SCOPOLAMINE 1 MG/3DAYS TD PT72
MEDICATED_PATCH | TRANSDERMAL | Status: AC
  Administered 2018-11-14: 11:00:00 1.5 mg
  Filled 2018-11-14: qty 1

## 2018-11-14 MED ORDER — ROPIVACAINE HCL 5 MG/ML IJ SOLN
INTRAMUSCULAR | Status: AC
  Filled 2018-11-14: qty 30

## 2018-11-14 MED ORDER — ONDANSETRON HCL 4 MG/2ML IJ SOLN
4.0000 mg | Freq: Four times a day (QID) | INTRAMUSCULAR | Status: DC | PRN
  Administered 2018-11-14: 22:00:00 4 mg via INTRAVENOUS
  Filled 2018-11-14: qty 2

## 2018-11-14 MED ORDER — KETOROLAC TROMETHAMINE 30 MG/ML IJ SOLN
30.0000 mg | Freq: Once | INTRAMUSCULAR | Status: AC
  Administered 2018-11-14: 16:00:00 30 mg via INTRAVENOUS

## 2018-11-14 MED ORDER — LIDOCAINE 2% (20 MG/ML) 5 ML SYRINGE
INTRAMUSCULAR | Status: DC | PRN
  Administered 2018-11-14: 60 mg via INTRAVENOUS

## 2018-11-14 MED ORDER — HEMOSTATIC AGENTS (NO CHARGE) OPTIME
TOPICAL | Status: DC | PRN
  Administered 2018-11-14: 1 via TOPICAL

## 2018-11-14 MED ORDER — LACTATED RINGERS IV SOLN: INTRAVENOUS | Status: DC

## 2018-11-14 MED ORDER — SUCCINYLCHOLINE CHLORIDE 200 MG/10ML IV SOSY
PREFILLED_SYRINGE | INTRAVENOUS | Status: DC | PRN
  Administered 2018-11-14: 120 mg via INTRAVENOUS

## 2018-11-14 MED ORDER — DIPHENHYDRAMINE HCL 50 MG/ML IJ SOLN
INTRAMUSCULAR | Status: AC
  Filled 2018-11-14: qty 1

## 2018-11-14 SURGICAL SUPPLY — 61 items
BAG URO CATCHER STRL LF (MISCELLANEOUS) ×3 IMPLANT
BARRIER ADHS 3X4 INTERCEED (GAUZE/BANDAGES/DRESSINGS) IMPLANT
CATH FOLEY 3WAY  5CC 16FR (CATHETERS) ×1
CATH FOLEY 3WAY 5CC 16FR (CATHETERS) ×1 IMPLANT
CATH URET 5FR 28IN OPEN ENDED (CATHETERS) IMPLANT
CHLORAPREP W/TINT 26 (MISCELLANEOUS) ×2 IMPLANT
CLOTH BEACON ORANGE TIMEOUT ST (SAFETY) ×2 IMPLANT
COVER BACK TABLE 60X90IN (DRAPES) ×1 IMPLANT
COVER TIP SHEARS 8 DVNC (MISCELLANEOUS) ×1 IMPLANT
COVER TIP SHEARS 8MM DA VINCI (MISCELLANEOUS) ×1
COVER WAND RF STERILE (DRAPES) IMPLANT
DECANTER SPIKE VIAL GLASS SM (MISCELLANEOUS) ×3 IMPLANT
DEFOGGER SCOPE WARMER CLEARIFY (MISCELLANEOUS) ×2 IMPLANT
DERMABOND ADVANCED (GAUZE/BANDAGES/DRESSINGS) ×1
DERMABOND ADVANCED .7 DNX12 (GAUZE/BANDAGES/DRESSINGS) ×1 IMPLANT
DRAPE ARM DVNC X/XI (DISPOSABLE) ×4 IMPLANT
DRAPE COLUMN DVNC XI (DISPOSABLE) ×1 IMPLANT
DRAPE DA VINCI XI ARM (DISPOSABLE) ×4
DRAPE DA VINCI XI COLUMN (DISPOSABLE) ×1
ELECT REM PT RETURN 15FT ADLT (MISCELLANEOUS) ×2 IMPLANT
GAUZE PETROLATUM 1 X8 (GAUZE/BANDAGES/DRESSINGS) IMPLANT
GLOVE BIO SURGEON STRL SZ7 (GLOVE) ×13 IMPLANT
GLOVE BIOGEL PI IND STRL 7.0 (GLOVE) ×5 IMPLANT
GLOVE BIOGEL PI INDICATOR 7.0 (GLOVE) ×5
GLOVE SURG SS PI 8.0 STRL IVOR (GLOVE) IMPLANT
GOWN STRL REUS W/TWL XL LVL3 (GOWN DISPOSABLE) ×2 IMPLANT
GUIDEWIRE STR DUAL SENSOR (WIRE) ×2 IMPLANT
IRRIG SUCT STRYKERFLOW 2 WTIP (MISCELLANEOUS) ×2
IRRIGATION SUCT STRKRFLW 2 WTP (MISCELLANEOUS) ×1 IMPLANT
KIT TURNOVER KIT A (KITS) IMPLANT
MANIFOLD NEPTUNE II (INSTRUMENTS) ×2 IMPLANT
OBTURATOR OPTICAL STANDARD 8MM (TROCAR) ×1
OBTURATOR OPTICAL STND 8 DVNC (TROCAR) ×1
OBTURATOR OPTICALSTD 8 DVNC (TROCAR) ×1 IMPLANT
OCCLUDER COLPOPNEUMO (BALLOONS) ×3 IMPLANT
PACK CYSTO (CUSTOM PROCEDURE TRAY) ×2 IMPLANT
PACK ROBOT WH (CUSTOM PROCEDURE TRAY) ×2 IMPLANT
PACK ROBOTIC GOWN (GOWN DISPOSABLE) ×2 IMPLANT
PACK TRENDGUARD 450 HYBRID PRO (MISCELLANEOUS) IMPLANT
PAD PREP 24X48 CUFFED NSTRL (MISCELLANEOUS) ×1 IMPLANT
PROTECTOR NERVE ULNAR (MISCELLANEOUS) ×4 IMPLANT
SEAL CANN UNIV 5-8 DVNC XI (MISCELLANEOUS) ×3 IMPLANT
SEAL XI 5MM-8MM UNIVERSAL (MISCELLANEOUS) ×4
SEALER VESSEL DA VINCI XI (MISCELLANEOUS) ×1
SEALER VESSEL EXT DVNC XI (MISCELLANEOUS) IMPLANT
SET CYSTO W/LG BORE CLAMP LF (SET/KITS/TRAYS/PACK) IMPLANT
SET TRI-LUMEN FLTR TB AIRSEAL (TUBING) ×2 IMPLANT
SUT VICRYL 0 UR6 27IN ABS (SUTURE) ×1 IMPLANT
SUT VICRYL 4-0 PS2 18IN ABS (SUTURE) ×4 IMPLANT
SUT VLOC 180 0 9IN  GS21 (SUTURE) ×2
SUT VLOC 180 0 9IN GS21 (SUTURE) ×1 IMPLANT
TIP RUMI ORANGE 6.7MMX12CM (TIP) IMPLANT
TIP UTERINE 5.1X6CM LAV DISP (MISCELLANEOUS) IMPLANT
TIP UTERINE 6.7X10CM GRN DISP (MISCELLANEOUS) IMPLANT
TIP UTERINE 6.7X6CM WHT DISP (MISCELLANEOUS) IMPLANT
TIP UTERINE 6.7X8CM BLUE DISP (MISCELLANEOUS) ×1 IMPLANT
TOWEL OR 17X26 10 PK STRL BLUE (TOWEL DISPOSABLE) ×2 IMPLANT
TRENDGUARD 450 HYBRID PRO PACK (MISCELLANEOUS)
TROCAR PORT AIRSEAL 5X120 (TROCAR) ×2 IMPLANT
TUBING CONNECTING 10 (TUBING) ×2 IMPLANT
WATER STERILE IRR 1000ML POUR (IV SOLUTION) ×2 IMPLANT

## 2018-11-14 NOTE — Op Note (Addendum)
Preoperative diagnosis: Pelvic pain, endometriosis, right ovarian large endometrioma, failed medical therapy   Postop diagnosis: Same  Procedure: da Vinci robot assisted total laparoscopic hysterectomy and bilateral salpingoophorectomy.              Pre-op operative bilateral ureteral stents placed by Dr Annabell Howells  Intra-op Enterolysis by Dr Fredricka Bonine  Anesthesia Gen. Endotracheal  Surgeon: Shea Evans, MD  Assistant: Arlan Organ, CNM   IV fluids: 2000 cc LR  EBL: 50cc  Urine output: 95 cc, blood tinged from the start of surgery following stents placement   Complications: none  Pathology: Uterus with cervix and both ovaries and both fallopian tubes   Disposition: PACU, stable  Findings: Enlarged right ovary adhesed completely to right cul de sac and ovarian fossa adhesed to peritoneum over the ureter. Left tube end and ovary adhesed to sigmoid colon but mainly densely adhesed to bowel epiploica. Right ureter was palpated only due to stent in placed as visualization was not adequate to do dense adhesion of ovary with endometrioma. Bladder peritoneum was normal. No other sites noted endometriosis, bowels or appendix didn't have endometriosis. Uterus was normal in size but filmy adhesions from uterus to posterior cul de sac noted.   Procedure:  Indication: --  Complications of surgery including infection, bleeding, damage to internal organs and other surgery related problems including pneumonia, VTE reviewed and informed written consent was obtained. Also reviewed risks from surgical menopause.  She understood and gave informed written consent.  Patient was brought to the operating room with IV running. Time out carried out. She received 2 gm Ancef . Underwent general anesthesia without difficulty and was given dorsal lithotomy position, prepped and draped in sterile fashion.  She had bilateral ureteral stents placed by Dr Annabell Howells and foley catheter was placed, stents were tied to foley  catheter.   Patient was prepped, draped for me. Time out carried out again. Cervix was exposed with a speculum and anterior lip of the cervix was grasped with tenaculum. Uterus was sounded to 8cm. A  # 8 Rumi tip and a small Koh ring was assembled on the [employer] and entered in the uterine cavity and balloon was inflated to secure it in place. Koh ring was palpated again cervico-vaginal junction. Speculum was removed, tenaculum was left on the cervix.  Attention was focused on abdomen. Supraumbilical 12 mm vertical incision made with scalpel after injecting dilute Ropivacaine. Fascia dissected, grasped with Kocher's and incised, posterior rectus sheath and peritoneum grasped, incised, intraabdominal entry confirmed. Purse string stay stitch on 0-Vicryl taken on fascia and Robot cannula with skin occluder attachement introduced and Vicryl sutures secured. Pneumoperitoneum was begun. Laparoscope was introduced and the peritoneal cavity was evaluated. Entry was uneventful.Trendelenburg position given. Port sited marked and injected with Ropivacaine and incised. Two Robotic cannulas inserted on right side and one robotic cannula and flowseal 5 on the left at marked sites under vision.  Robot was docked from left. Vessel sealer in arm 1, laparoscope in 2, monopolar scissors in 3 and fenestrated grasper in arm 4. Energy sources attached.   Dr. Juliene Pina scrubbed out and went for surgical console. Arlan Organ remained at patient bedside.  Uterus was normal in size but posterior cul-de-sac filmy adhesions noted. Right ovary marked enlarged with 2 large endometriomas, completely adherent to right lateral wall. Left ovary was also enlarged a bit and adherent to tube and large bowel on the left. Left lateral wall was free of adhesions as ovary was not adherent to  it and left ureter could be seen and stent well palpated. Right ovary was adherent to right lateral wall and ureter with stent was palpated at the lower  most part of ovarian endometrioma. Stent was well appreciated.   Uterus was deviated to the patient's right.Left tube and ovary were stuck together and sigmoid colon was adhesed to the ovary and end of the tube pretty well. Dissection began with only cold scissors and at one point I was too close to the colon serosa, could not find windows to dissect through, so called on call General Surgeon, Dr Fredricka Bonineonnor came to assist. She took over the console and completed enterolysis with cold scissors. Hemostasis was noted. Appreciate assistance.  Now left IP ligament was desiccated and cut using Vessel sealer, followed by broad ligament and then the left Round ligament which was desiccated and cut. Anterior bladder broad ligament was opened and incised. Posterior broad ligament incised up to the uterosacral ligament and left uterine vessels skeletonized and desiccated and cut using Vessel sealer.  Uterus was deviated to the left. Right ovary dissection was performed to lift and separate from right lateral pelvic wall and ovarian fossa. Endometrioma drained while doing dissection. Now ovarian egde of the endometrioma was grasped and with cold scissors ovary was dissected away from right peritoneum over the ureter area, which was possible due to presence of ureteral stent. Right round ligament grasped and desiccated and cut, anterior and posterior peritoneum opened up for better dissection of the right side of ovary and drop ureter further down. Then right IP was exposed, desiccated and incised. Ovary lifted off from right side wall and freed up by completing dissection and posterior broad ligament dissected to skeletonize right uterine vessels. Bladder flap, bladder was pushed away by blunt and sharp dissection with excellent hemostasis. Right uterine vessels were desiccated and cut. Vaginal occluder was inflated. Colpotomy was begun starting from midline anteriorly coming to the left and right and then circumferentially  staying above the uterosacral ligaments posteriorly. Uterus, cervix, both tubes and enlarged ovaries were pulled out of the vaginal opening and vaginal occluder placed back in vagina to maintain pneumoperitoneum.  Vaginal cut edges were evaluated for hemostasis which was excellent. Irrigation was performed pedicles appeared dry. Robotic instruments switched for suture-cut needle driver in arm 4 and fenestrated grasper in arm 1. 0-VLock used for suturing vaginal cuff which was closed well without complications. Vaginal occluder removed.  Irrigation was performed, all pedicles appeared to be hemostatic. Robotic instruments were removed. Robot was undocked. Patient was made supine. Lap'scope was reintroduced, hemostasis was excellent. Arista sprayed over right lateral wall dissection. Ureteral stents were palpated and no injury was noted. Liver surface appeared normal.  50 cc dilute Ropivacaine solution instilled in pelvis. All cannulas were removed under vision. Laparoscope and central port removed under vision.  Fascial stay sutures of central port tied together with excellent fascial closure. Skin bleeding cauterized. Skin approximated with subcuticular stitches on 4-0 Vicryl. Dermabond was applied. No vaginal bleeding noted on vaginal exam at the end. Foley and both stent removed together.  All counts were correct x2.  No complications. Patient tolerated procedure well and was reversed from anesthesia and brought to the PACU extubated and stable.   Dr Juliene PinaMody was the surgeon for entire case.  Intra-op Consultant for enterolysis by Dr Fredricka Bonineonnor Ureteral stents placed by Dr Lyn RecordsWrenn  V.Seydou Hearns, MD

## 2018-11-14 NOTE — Transfer of Care (Signed)
Immediate Anesthesia Transfer of Care Note  Patient: Teresa Guzman  Procedure(s) Performed: XI ROBOTIC ASSISTED LAPAROSCOPIC HYSTERECTOMY AND BILATERAL SALPINGO-OOPHORECTOMY (Bilateral ) CYSTOSCOPY WITH STENT PLACEMENT (Bilateral )  Patient Location: PACU  Anesthesia Type:General  Level of Consciousness: sedated  Airway & Oxygen Therapy: Patient Spontanous Breathing and Patient connected to face mask oxygen  Post-op Assessment: Report given to RN and Post -op Vital signs reviewed and stable  Post vital signs: Reviewed and stable  Last Vitals:  Vitals Value Taken Time  BP 106/62 11/14/2018  3:45 PM  Temp    Pulse 84 11/14/2018  3:45 PM  Resp 14 11/14/2018  3:45 PM  SpO2 100 % 11/14/2018  3:45 PM  Vitals shown include unvalidated device data.  Last Pain:  Vitals:   11/14/18 1015  TempSrc:   PainSc: 0-No pain         Complications: No apparent anesthesia complications

## 2018-11-14 NOTE — Op Note (Signed)
Procedure: Cystoscopy with insertion of bilateral ureteral catheters.  Preop diagnosis: Endometriosis.  Postop diagnosis: Same.  Surgeon: Dr. Bjorn Pippin.  Anesthesia: General.  Drains: Bilateral 5 French ureteral catheters.  Complications: None.  Indications: Teresa Guzman is a 38 year old white female with a history of endometriosis who is undergoing her third exploration.  She had previously had resection of implants adjacent to her left ureter and it was felt that ureteral catheters would be useful during the exploration.  Procedure: She was given antibiotics per Dr. Camillia Herter orders.  She was taken the operating room where general anesthetic was induced.  She was placed in lithotomy position.  She was prepped with Betadine solution and draped in usual sterile fashion.  Cystoscopy was performed using a 23 [place] scope and 30 degree lens.  Examination revealed a normal urethra.  The bladder wall had mild trabeculation with normal mucosa.  The ureteral orifice ease were in the normal anatomic position with with some evidence to suggest possibly small ureteroceles.  A 5 French open-ended and catheter was passed up the right ureter with the aid of a sensor wire under fluoroscopic guidance.  The wire was removed once the catheter had reached the kidney.  This technique was then repeated on the left side.  The bladder was drained and the cystoscope was removed.  A 16 French three-way Foley catheter was inserted and the balloon was filled with 10 mL of sterile fluid.  Ureteral catheters were secured to the Foley with a 2-0 silk tie and then the ureteral catheters and Foley were placed to a [place] device.  The [place] device was placed to straight drainage and the catheter irrigation port was plugged.  There were no complications during this portion of the procedure.

## 2018-11-14 NOTE — Op Note (Signed)
Operative Note  SHAUGHNESSY BRUECK  [id number]  [id number]  11/14/2018   Surgeon: Leeroy Bock A ConnorMD  Procedure performed: Robotic lysis of adhesion between the left adnexa and sigmoid  Preop diagnosis: adnexal adhesion to sigmoid  Post-op diagnosis/intraop findings: same  EBL: minimal cc Complications: none  Description of procedure: Intraoperative consult requested by Dr. Juliene Pina who is performing a robotic hysterectomy and bilateral salpingo-oophorectomy for endometriosis.  A dense and thick adhesion between the left adnexa and the sigmoid colon versus epiploic appendage was noted.  Using cold sharp dissection and meticulous layer by layer dissection, the left adnexa was able to be freed of its attachment to what turned out to be an epiploic appendage of the sigmoid colon.  Hemostasis was confirmed.  The colon surrounding this area was inspected and the serosa was free of any apparent injury.  The case is turned back over to Dr. Juliene Pina, please see her full operative report.

## 2018-11-14 NOTE — Anesthesia Preprocedure Evaluation (Addendum)
Anesthesia Evaluation  Patient identified by MRN, date of birth, ID band Patient awake    Reviewed: Allergy & Precautions, NPO status , Patient's Chart, lab work & pertinent test results  Airway Mallampati: I  TM Distance: >3 FB Neck ROM: Full    Dental no notable dental hx. (+) Teeth Intact, Dental Advisory Given   Pulmonary asthma ,    Pulmonary exam normal breath sounds clear to auscultation       Cardiovascular negative cardio ROS Normal cardiovascular exam Rhythm:Regular Rate:Normal  TTE 2017 EF 60-65%, valves normal   Neuro/Psych PSYCHIATRIC DISORDERS Depression Bipolar Disorder CVA (2/2 vertebral artery dissection), No Residual Symptoms    GI/Hepatic negative GI ROS, Neg liver ROS,   Endo/Other  Hypothyroidism   Renal/GU negative Renal ROS  negative genitourinary   Musculoskeletal negative musculoskeletal ROS (+)   Abdominal   Peds  Hematology  (+) Blood dyscrasia, anemia ,   Anesthesia Other Findings   Reproductive/Obstetrics negative OB ROS                            Anesthesia Physical Anesthesia Plan  ASA: II  Anesthesia Plan: General   Post-op Pain Management:    Induction: Intravenous  PONV Risk Score and Plan: 3 and Midazolam, Dexamethasone, Scopolamine patch - Pre-op and Ondansetron  Airway Management Planned: Oral ETT  Additional Equipment:   Intra-op Plan:   Post-operative Plan: Extubation in OR  Informed Consent: I have reviewed the patients History and Physical, chart, labs and discussed the procedure including the risks, benefits and alternatives for the proposed anesthesia with the patient or authorized representative who has indicated his/her understanding and acceptance.     Dental advisory given  Plan Discussed with: CRNA  Anesthesia Plan Comments:         Anesthesia Quick Evaluation

## 2018-11-14 NOTE — Anesthesia Procedure Notes (Signed)
Procedure Name: Intubation Date/Time: 11/14/2018 11:54 AM Performed by: Eben Burow, CRNA Pre-anesthesia Checklist: Patient identified, Emergency Drugs available, Suction available, Patient being monitored and Timeout performed Patient Re-evaluated:Patient Re-evaluated prior to induction Oxygen Delivery Method: Circle system utilized Preoxygenation: Pre-oxygenation with 100% oxygen Induction Type: IV induction Laryngoscope Size: Mac and 4 Grade View: Grade II Tube type: Oral Tube size: 7.0 mm Number of attempts: 1 Airway Equipment and Method: Stylet Placement Confirmation: ETT inserted through vocal cords under direct vision,  positive ETCO2 and breath sounds checked- equal and bilateral Secured at: 21 cm Tube secured with: Tape Dental Injury: Teeth and Oropharynx as per pre-operative assessment

## 2018-11-14 NOTE — H&P (Signed)
I was asked to place bilateral ureteral stents prior to her planned surgery for recurrent endometriosis.    I have reviewed current notes and her past history.  She had a ureteral implant on the right excised at her last surgery.     I discussed the risks of the procedure including bleeding, infection, injury to the urinary tract and failure of the stent insertion.

## 2018-11-15 DIAGNOSIS — J45909 Unspecified asthma, uncomplicated: Secondary | ICD-10-CM | POA: Diagnosis not present

## 2018-11-15 DIAGNOSIS — D649 Anemia, unspecified: Secondary | ICD-10-CM | POA: Diagnosis not present

## 2018-11-15 DIAGNOSIS — Z7951 Long term (current) use of inhaled steroids: Secondary | ICD-10-CM | POA: Diagnosis not present

## 2018-11-15 DIAGNOSIS — N801 Endometriosis of ovary: Secondary | ICD-10-CM | POA: Diagnosis not present

## 2018-11-15 DIAGNOSIS — Z79899 Other long term (current) drug therapy: Secondary | ICD-10-CM | POA: Diagnosis not present

## 2018-11-15 DIAGNOSIS — N858 Other specified noninflammatory disorders of uterus: Secondary | ICD-10-CM | POA: Diagnosis not present

## 2018-11-15 DIAGNOSIS — Z9889 Other specified postprocedural states: Secondary | ICD-10-CM | POA: Diagnosis not present

## 2018-11-15 DIAGNOSIS — N888 Other specified noninflammatory disorders of cervix uteri: Secondary | ICD-10-CM | POA: Diagnosis not present

## 2018-11-15 DIAGNOSIS — Z8673 Personal history of transient ischemic attack (TIA), and cerebral infarction without residual deficits: Secondary | ICD-10-CM | POA: Diagnosis not present

## 2018-11-15 DIAGNOSIS — E039 Hypothyroidism, unspecified: Secondary | ICD-10-CM | POA: Diagnosis not present

## 2018-11-15 DIAGNOSIS — Z79890 Hormone replacement therapy (postmenopausal): Secondary | ICD-10-CM | POA: Diagnosis not present

## 2018-11-15 DIAGNOSIS — Z7982 Long term (current) use of aspirin: Secondary | ICD-10-CM | POA: Diagnosis not present

## 2018-11-15 DIAGNOSIS — N736 Female pelvic peritoneal adhesions (postinfective): Secondary | ICD-10-CM | POA: Diagnosis not present

## 2018-11-15 DIAGNOSIS — N802 Endometriosis of fallopian tube: Secondary | ICD-10-CM | POA: Diagnosis not present

## 2018-11-15 LAB — BASIC METABOLIC PANEL
Anion gap: 9 (ref 5–15)
BUN: 10 mg/dL (ref 6–20)
CO2: 21 mmol/L — ABNORMAL LOW (ref 22–32)
Calcium: 8.8 mg/dL — ABNORMAL LOW (ref 8.9–10.3)
Chloride: 105 mmol/L (ref 98–111)
Creatinine, Ser: 0.76 mg/dL (ref 0.44–1.00)
GFR calc Af Amer: >60 mL/min (ref >60)
GFR calc non Af Amer: >60 mL/min (ref >60)
Glucose, Bld: 151 mg/dL — ABNORMAL HIGH (ref 70–99)
Potassium: 4.6 mmol/L (ref 3.5–5.1)
Sodium: 135 mmol/L (ref 135–145)

## 2018-11-15 LAB — CBC
HCT: 41 % (ref 36.0–46.0)
Hemoglobin: 13.5 g/dL (ref 12.0–15.0)
MCH: 30.9 pg (ref 26.0–34.0)
MCHC: 32.9 g/dL (ref 30.0–36.0)
MCV: 93.8 fL (ref 80.0–100.0)
Platelets: 249 10*3/uL (ref 150–400)
RBC: 4.37 MIL/uL (ref 3.87–5.11)
RDW: 12.2 % (ref 11.5–15.5)
WBC: 13.8 10*3/uL — ABNORMAL HIGH (ref 4.0–10.5)
nRBC: 0 % (ref 0.0–0.2)

## 2018-11-15 MED ORDER — IBUPROFEN 600 MG PO TABS: 600.0000 mg | ORAL_TABLET | Freq: Three times a day (TID) | ORAL | 0 refills | Status: DC

## 2018-11-15 MED ORDER — ACETAMINOPHEN 325 MG PO TABS: 650.0000 mg | ORAL_TABLET | ORAL | Status: AC | PRN

## 2018-11-15 NOTE — TOC Transition Note (Signed)
Transition of Care [facility]) - CM/SW Discharge Note   Patient Details  Name: SWEDEN HEINSOHN MRN: [id number] Date of Birth: 06-Dec-1980  Transition of Care [facility]) CM/SW Contact:  Golda Acre, RN Phone Number: 11/15/2018, 10:06 AM   Clinical Narrative:    Discharged to home, has follow md appointment.  No hhc orders found in chart.   Final next level of care: Home/Self Care Barriers to Discharge: No Barriers Identified   Patient Goals and CMS Choice Patient states their goals for this hospitalization and ongoing recovery are:: to go home and not be exposed to the covid-19      Discharge Placement                       Discharge Plan and Services   Discharge Planning Services: CM Consult                      Social Determinants of Health (SDOH) Interventions     Readmission Risk Interventions No flowsheet data found.

## 2018-11-15 NOTE — Anesthesia Postprocedure Evaluation (Signed)
Anesthesia Post Note  Patient: Teresa Guzman  Procedure(s) Performed: XI ROBOTIC ASSISTED LAPAROSCOPIC HYSTERECTOMY AND BILATERAL SALPINGO-OOPHORECTOMY (Bilateral ) CYSTOSCOPY WITH STENT PLACEMENT (Bilateral )     Patient location during evaluation: PACU Anesthesia Type: General Level of consciousness: awake and alert Pain management: pain level controlled Vital Signs Assessment: post-procedure vital signs reviewed and stable Respiratory status: spontaneous breathing, nonlabored ventilation, respiratory function stable and patient connected to nasal cannula oxygen Cardiovascular status: blood pressure returned to baseline and stable Postop Assessment: no apparent nausea or vomiting Anesthetic complications: no    Last Vitals:  Vitals:   11/15/18 0639 11/15/18 0934  BP: 113/65 119/62  Pulse: 92 (!) 114  Resp: 17 18  Temp: 37.2 C 37 C  SpO2: 98% 99%    Last Pain:  Vitals:   11/15/18 0934  TempSrc: Oral  PainSc:                  Rian Busche L Jodeci Rini

## 2018-11-15 NOTE — Progress Notes (Signed)
1 Day Post-Op Procedure(s) (LRB): XI ROBOTIC ASSISTED LAPAROSCOPIC HYSTERECTOMY AND BILATERAL SALPINGO-OOPHORECTOMY (Bilateral) CYSTOSCOPY WITH STENT PLACEMENT (Bilateral)  Subjective: Patient reports doing well. Pain controlled with Ibuprofen and Tylenol. Ambulating. Voiding, urine is not getting clearer from being bloody since surgery. No vag bleeding No CP/ SOB. Ready for discharge.    Surgical findings incl needing intra-op gen surg consult reviewed Surgical menopause s/s reviewed, denies hot flashes now so wait until symptoms or start ERT in 2 wks   Objective: I have reviewed patient's vital signs, intake and output, medications and labs. BP 119/62 (BP Location: Left Arm)   Pulse (!) 114   Temp 98.6 F (37 C) (Oral)   Resp 18   Ht 5' (1.524 m)   Wt 65.2 kg   SpO2 99%   BMI 28.08 kg/m   General: alert and cooperative Resp: clear to auscultation bilaterally Cardio: regular rate and rhythm, S1, S2 normal, no murmur, click, rub or gallop GI: soft, non-tender; bowel sounds normal; no masses,  no organomegaly Extremities: extremities normal, atraumatic, no cyanosis or edema and Homans sign is negative, no sign of DVT Vaginal Bleeding: none Active BS, skin crepitus. Skin incisions bruised   CBC Latest Ref Rng & Units 11/15/2018 11/14/2018 11/07/2018  WBC 4.0 - 10.5 K/uL 13.8(H) 6.3 6.3  Hemoglobin 12.0 - 15.0 g/dL 74.8 27.0 78.6  Hematocrit 36.0 - 46.0 % 41.0 44.6 40.3  Platelets 150 - 400 K/uL 249 253 259   CMP Latest Ref Rng & Units 11/15/2018 11/14/2018 11/07/2018  Glucose 70 - 99 mg/dL 754(G) 87 95  BUN 6 - 20 mg/dL 10 13 12   Creatinine 0.44 - 1.00 mg/dL 9.20 1.00 7.12  Sodium 135 - 145 mmol/L 135 137 136  Potassium 3.5 - 5.1 mmol/L 4.6 4.1 4.1  Chloride 98 - 111 mmol/L 105 105 104  CO2 22 - 32 mmol/L 21(L) 25 26  Calcium 8.9 - 10.3 mg/dL 1.9(X) 8.9 5.8(I)  Total Protein 6.5 - 8.1 g/dL - 8.4(H) -  Total Bilirubin 0.3 - 1.2 mg/dL - 1.1 -  Alkaline Phos 38 - 126 U/L - 109 -  AST  15 - 41 U/L - 77(H) -  ALT 0 - 44 U/L - 40 -    Assessment: s/p Procedure(s): XI ROBOTIC ASSISTED LAPAROSCOPIC HYSTERECTOMY AND BILATERAL SALPINGO-OOPHORECTOMY (Bilateral) CYSTOSCOPY WITH STENT PLACEMENT (Bilateral): stable, progressing well, tolerating diet and pain well controlled   Plan: Discharge home  Post-op care reviewed, warning s/s incl fever/ worse pain/ vaginal bleeding/ vomiting/ CP/ SOB/ LE pain with swelling reviewed, call office line if problems (24/7) F/up 2-4 wks in office based on Covid19 cases in the city, if getting better, come to office in 2 wks otherwise wait for 4 weeks    LOS: 0 days    Robley Fries 11/15/2018, 9:34 AM

## 2018-11-15 NOTE — Discharge Summary (Addendum)
Physician Discharge Summary  Patient ID: Teresa Guzman MRN: [id number] DOB/AGE: 02-01-1981 38 y.o.  Admit date: 11/14/2018 Discharge date: 11/15/2018  Admission Diagnoses: Pelvic endometriosis with large right ovarian endometrioma, worsening pelvic pain  Discharge Diagnoses:  Active Problems:   Endometriosis   S/P total hysterectomy and bilateral salpingo-oophorectomy Pre-op prophylactic ureteral stents placement for intra-op assistance - Dr Annabell Howells Intra-op Gen Surg consultant for enterolysis - Dr Fredricka Bonine  Discharged Condition: good  Hospital Course: Surgery completed without complications, no anesthesia complications. Post-op overnight stay uneventful. Normal vitals, labs and exam.  Pain controlled with Ibuprofen and Tylenol. Ambulating, voiding, ate regular diet.   Discharge Exam: BP 119/62 (BP Location: Left Arm)   Pulse (!) 114   Temp 98.6 F (37 C) (Oral)   Resp 18   Ht 5' (1.524 m)   Wt 65.2 kg   SpO2 99%   BMI 28.08 kg/m   General: alert and cooperative Resp: clear to auscultation bilaterally Cardio: regular rate and rhythm, S1, S2 normal, no murmur, click, rub or gallop GI: soft, non-tender; bowel sounds normal; no masses,  no organomegaly. Active BS, skin crepitus. Skin incisions bruised  Extremities: extremities normal, atraumatic, no cyanosis or edema and Homans sign is negative, no sign of DVT Vaginal Bleeding: none   CBC Latest Ref Rng & Units 11/15/2018 11/14/2018 11/07/2018  WBC 4.0 - 10.5 K/uL 13.8(H) 6.3 6.3  Hemoglobin 12.0 - 15.0 g/dL 58.8 50.2 77.4  Hematocrit 36.0 - 46.0 % 41.0 44.6 40.3  Platelets 150 - 400 K/uL 249 253 259   CMP Latest Ref Rng & Units 11/15/2018 11/14/2018 11/07/2018  Glucose 70 - 99 mg/dL 128(N) 87 95  BUN 6 - 20 mg/dL 10 13 12   Creatinine 0.44 - 1.00 mg/dL 8.67 6.72 0.94  Sodium 135 - 145 mmol/L 135 137 136  Potassium 3.5 - 5.1 mmol/L 4.6 4.1 4.1  Chloride 98 - 111 mmol/L 105 105 104  CO2 22 - 32 mmol/L 21(L) 25 26  Calcium 8.9 - 10.3  mg/dL 7.0(J) 8.9 6.2(E)  Total Protein 6.5 - 8.1 g/dL - 8.4(H) -  Total Bilirubin 0.3 - 1.2 mg/dL - 1.1 -  Alkaline Phos 38 - 126 U/L - 109 -  AST 15 - 41 U/L - 77(H) -  ALT 0 - 44 U/L - 40 -     Disposition: Home  Post-op care reviewed, warning s/s incl fever/ worse pain/ vaginal bleeding/ vomiting/ CP/ SOB/ LE pain with swelling reviewed, call office line if problems (24/7) F/up 2-4 wks in office based on Covid19 cases in the city, if getting better, come to office in 2 wks otherwise wait for 4 weeks   Discharge Instructions    Call MD for:   Complete by:  As directed    Heavy vaginal bleeding   Call MD for:  difficulty breathing, headache or visual disturbances   Complete by:  As directed    Call MD for:  extreme fatigue   Complete by:  As directed    Call MD for:  hives   Complete by:  As directed    Call MD for:  persistant dizziness or light-headedness   Complete by:  As directed    Call MD for:  persistant nausea and vomiting   Complete by:  As directed    Call MD for:  redness, tenderness, or signs of infection (pain, swelling, redness, odor or green/yellow discharge around incision site)   Complete by:  As directed    Call MD for:  severe uncontrolled pain   Complete by:  As directed    Call MD for:  temperature >100.4   Complete by:  As directed    Diet - low sodium heart healthy   Complete by:  As directed    Discharge instructions   Complete by:  As directed    Call office in 1-2 wks for post-op visit in 2-4 wks   Increase activity slowly   Complete by:  As directed      Allergies as of 11/15/2018      Reactions   Morphine And Related Other (See Comments)   Headache for a long time per pt   Neosporin [neomycin-bacitracin Zn-polymyx] Rash      Medication List    STOP taking these medications   ferrous sulfate 325 (65 FE) MG tablet     TAKE these medications   acetaminophen 325 MG tablet Commonly known as:  TYLENOL Take 2 tablets (650 mg total) by  mouth every 4 (four) hours as needed for mild pain (temperature > 101.5.).   ADULT ONE DAILY GUMMIES PO Take 2 tablets by mouth daily.   aspirin EC 81 MG tablet Take 81 mg by mouth daily.   Azelastine HCl 0.15 % Soln USE 1 SPRAYS IN EACH NOSTRIL TWICE DAILY What changed:    how much to take  how to take this  when to take this  additional instructions   fluticasone 50 MCG/ACT nasal spray Commonly known as:  FLONASE USE 1 TO 2 SPRAYS INTO THE  NOSE DAILY What changed:    how much to take  how to take this  when to take this  additional instructions   ibuprofen 600 MG tablet Commonly known as:  ADVIL,MOTRIN Take 1 tablet (600 mg total) by mouth every 8 (eight) hours.   indomethacin 50 MG capsule Commonly known as:  INDOCIN Take 50 mg by mouth daily as needed (prior to excersize).   levothyroxine 112 MCG tablet Commonly known as:  SYNTHROID, LEVOTHROID Take 1 tablet (112 mcg total) by mouth daily.   loratadine-pseudoephedrine 10-240 MG 24 hr tablet Commonly known as:  CLARITIN-D 24-hour Take 1 tablet by mouth daily.   ProAir HFA 108 (90 Base) MCG/ACT inhaler Generic drug:  albuterol INHALE 2 PUFFS INTO THE LUNGS EVERY 6 HOURS AS NEEDED FOR WHEEZING OR SHORTNESS OF BREATH What changed:  See the new instructions.   VITAMIN D PO Take 5,000 Units by mouth daily.        SignedRobley Fries: Pratik Dalziel R Felipe Cabell 11/15/2018, 9:43 AM

## 2018-11-15 NOTE — Plan of Care (Signed)
  Problem: Education: Goal: Knowledge of General Education information will improve Description Including pain rating scale, medication(s)/side effects and non-pharmacologic comfort measures Outcome: Adequate for Discharge   Problem: Health Behavior/Discharge Planning: Goal: Ability to manage health-related needs will improve Outcome: Adequate for Discharge   Problem: Clinical Measurements: Goal: Ability to maintain clinical measurements within normal limits will improve Outcome: Adequate for Discharge Goal: Will remain free from infection Outcome: Adequate for Discharge Goal: Diagnostic test results will improve Outcome: Adequate for Discharge Goal: Respiratory complications will improve Outcome: Adequate for Discharge Goal: Cardiovascular complication will be avoided Outcome: Adequate for Discharge   Problem: Activity: Goal: Risk for activity intolerance will decrease Outcome: Adequate for Discharge   Problem: Nutrition: Goal: Adequate nutrition will be maintained Outcome: Adequate for Discharge   Problem: Coping: Goal: Level of anxiety will decrease Outcome: Adequate for Discharge   Problem: Elimination: Goal: Will not experience complications related to bowel motility Outcome: Adequate for Discharge Goal: Will not experience complications related to urinary retention Outcome: Adequate for Discharge   Problem: Pain Managment: Goal: General experience of comfort will improve Outcome: Adequate for Discharge   Problem: Safety: Goal: Ability to remain free from injury will improve Outcome: Adequate for Discharge   Problem: Skin Integrity: Goal: Risk for impaired skin integrity will decrease Outcome: Adequate for Discharge   Problem: Education: Goal: Required Educational Video(s) Outcome: Adequate for Discharge   Problem: Clinical Measurements: Goal: Postoperative complications will be avoided or minimized Outcome: Adequate for Discharge   Problem: Skin  Integrity: Goal: Demonstration of wound healing without infection will improve Outcome: Adequate for Discharge  Home with husband. Discharge teaching done, written instructions given.

## 2018-11-18 ENCOUNTER — Encounter (HOSPITAL_COMMUNITY): Payer: Self-pay | Admitting: Obstetrics & Gynecology

## 2018-11-30 ENCOUNTER — Emergency Department (HOSPITAL_COMMUNITY)
Admission: EM | Admit: 2018-11-30 | Discharge: 2018-12-01 | Disposition: A | Discharge status: Home / Self Care | Payer: BLUE CROSS/BLUE SHIELD | Attending: Emergency Medicine | Admitting: Emergency Medicine

## 2018-11-30 ENCOUNTER — Encounter (HOSPITAL_COMMUNITY): Payer: Self-pay | Admitting: *Deleted

## 2018-11-30 ENCOUNTER — Other Ambulatory Visit: Payer: Self-pay

## 2018-11-30 DIAGNOSIS — N939 Abnormal uterine and vaginal bleeding, unspecified: Secondary | ICD-10-CM | POA: Insufficient documentation

## 2018-11-30 DIAGNOSIS — Z90721 Acquired absence of ovaries, unilateral: Secondary | ICD-10-CM | POA: Insufficient documentation

## 2018-11-30 DIAGNOSIS — Z79899 Other long term (current) drug therapy: Secondary | ICD-10-CM | POA: Diagnosis not present

## 2018-11-30 DIAGNOSIS — Z90710 Acquired absence of both cervix and uterus: Secondary | ICD-10-CM | POA: Diagnosis not present

## 2018-11-30 DIAGNOSIS — Z7982 Long term (current) use of aspirin: Secondary | ICD-10-CM | POA: Diagnosis not present

## 2018-11-30 DIAGNOSIS — J45909 Unspecified asthma, uncomplicated: Secondary | ICD-10-CM | POA: Insufficient documentation

## 2018-11-30 DIAGNOSIS — E039 Hypothyroidism, unspecified: Secondary | ICD-10-CM | POA: Insufficient documentation

## 2018-11-30 MED ORDER — SODIUM CHLORIDE 0.9 % IV BOLUS
1000.0000 mL | Freq: Once | INTRAVENOUS | Status: AC
  Administered 2018-11-30: 1000 mL via INTRAVENOUS

## 2018-11-30 NOTE — ED Triage Notes (Signed)
Pt post op from hysterectomy 11/14/18 and abd increased when spotting began today, now flow is constant, MD suggested to come in.

## 2018-11-30 NOTE — ED Provider Notes (Signed)
[facility]-EMERGENCY DEPT Provider Note   CSN: [id number] Arrival date & time: 11/30/18  2300    History   Chief Complaint Chief Complaint  Patient presents with  . Post-op Problem    HPI Teresa Guzman is a 38 y.o. female.     38 y/o G0 female presents to the ED for c/o vaginal bleeding. She is 16 days s/p total hysterectomy and BSO by Dr. Juliene Pina. Surgery was assisted by both General Surgery and Urology.  Noted onset of spotting at ~2000 tonight which began to worsen at ~2200. She has been passing clots and soaked through 5 panty liners prior to ED arrival.  She has some general soreness in her pelvic region with intermittent sharp "twinges" of pain that come "in waves".  No medications taken PTA.  She is on a baby ASA daily, but no other chronic anticoagulants.  No fevers, N/V.  Called MD who suggested she come in for evaluation.  The history is provided by the patient. No language interpreter was used.    Past Medical History:  Diagnosis Date  . Anemia   . Asthma    childhood asthma - no inhaler, no problems as adult  . Bipolar disorder (HCC)    dx while in college, no current meds  . Depression    no current meds  . Endometriosis   . Headache    HX  MIGRAINES During menstrual cycle  . Hypothyroidism childhood  . Infertility, female   . Seasonal allergies   . Stroke (HCC) 09/09/15   hadright cerebellar infarct secondary to right vertebral artery dissection-no deficits, no treatment, no problems since 09/2015  . Vertebral artery dissection [facility]) 2017    Patient Active Problem List   Diagnosis Date Noted  . Endometriosis 11/14/2018  . S/P total hysterectomy and bilateral salpingo-oophorectomy 11/14/2018  . Status post cervical spinal fusion 03/08/2017  . Positive ANA (antinuclear antibody) 09/15/2015  . Hx of cerebral embolic infarction   . Asthma 09/09/2015  . Pap smear for cervical cancer screening 07/21/2015  . Cough variant asthma 05/15/2013  .  Screening for malignant neoplasm of the cervix 12/14/2010  . Physical exam 12/14/2010  . Infertility, female 12/14/2010  . OTHER AND UNSPECIFIED OVARIAN CYST 05/10/2010  . Vitamin D deficiency 05/06/2010  . Hypothyroidism 10/02/2008  . Anemia 10/02/2008  . Migraine, menstrual 10/02/2008  . RHINITIS 10/02/2008    Past Surgical History:  Procedure Laterality Date  . ANTERIOR CERVICAL DECOMP/DISCECTOMY FUSION N/A 03/08/2017   Procedure: ANTERIOR CERVICAL DECOMPRESSION/DISCECTOMY FUSION C5-6;  Surgeon: Venita Lick, MD;  Location: [facility] OR;  Service: Orthopedics;  Laterality: N/A;  3 hours  . arm surgery Left 1987  . CHOLECYSTECTOMY  10/2017   [place].  . COLONOSCOPY  06/2017   at [facility]  . CYST REMOVAL HAND     CYST REMOVED OFF OVARY - Not Hand  . CYSTOSCOPY WITH STENT PLACEMENT Bilateral 11/14/2018   Procedure: CYSTOSCOPY WITH STENT PLACEMENT;  Surgeon: Bjorn Pippin, MD;  Location: [facility] ORS;  Service: Urology;  Laterality: Bilateral;  . DILITATION & CURRETTAGE/HYSTROSCOPY WITH NOVASURE ABLATION N/A 12/13/2017   Procedure: DILATATION & CURETTAGE/HYSTEROSCOPY WITH HYDROTHERMAL   ABLATION;  Surgeon: Shea Evans, MD;  Location: [facility] ORS;  Service: Gynecology;  Laterality: N/A;  . LAPAROSCOPIC OVARIAN CYSTECTOMY Bilateral 12/13/2017   Procedure: LAPAROSCOPY WITH RIGHT OVARIAN CYSTECTOMY, ENDOMETRIOSIS ABLATION, AND EXCISION OF LEFT URETERAL IMPLANT;  Surgeon: Shea Evans, MD;  Location: [facility] ORS;  Service: Gynecology;  Laterality: Bilateral;  .  OVARIAN CYST REMOVAL  2011   [place] abdominal excision  . ROBOTIC ASSISTED LAPAROSCOPIC HYSTERECTOMY AND SALPINGECTOMY Bilateral 11/14/2018   Procedure: XI ROBOTIC ASSISTED LAPAROSCOPIC HYSTERECTOMY AND BILATERAL SALPINGO-OOPHORECTOMY;  Surgeon: Shea Evans, MD;  Location: [facility] ORS;  Service: Gynecology;  Laterality: Bilateral;  . WISDOM TOOTH EXTRACTION       OB History   No obstetric history on file.      Home Medications    Prior to  Admission medications   Medication Sig Start Date End Date Taking? Authorizing Provider  acetaminophen (TYLENOL) 325 MG tablet Take 2 tablets (650 mg total) by mouth every 4 (four) hours as needed for mild pain (temperature > 101.5.). 11/15/18   Shea Evans, MD  aspirin EC 81 MG tablet Take 81 mg by mouth daily.    [provider]  Azelastine HCl 0.15 % SOLN USE 1 SPRAYS IN EACH NOSTRIL TWICE DAILY Patient taking differently: Place 1 spray into both nostrils daily.  12/04/17   Sheliah Hatch, MD  fluticasone (FLONASE) 50 MCG/ACT nasal spray USE 1 TO 2 SPRAYS INTO THE  NOSE DAILY Patient taking differently: Place 1 spray into both nostrils 2 (two) times daily.  03/04/15   Sheliah Hatch, MD  ibuprofen (ADVIL,MOTRIN) 600 MG tablet Take 1 tablet (600 mg total) by mouth every 8 (eight) hours. 11/15/18   Shea Evans, MD  indomethacin (INDOCIN) 50 MG capsule Take 50 mg by mouth daily as needed (prior to excersize).  10/11/18   [provider]  levothyroxine (SYNTHROID, LEVOTHROID) 112 MCG tablet Take 1 tablet (112 mcg total) by mouth daily. 10/02/18   Sheliah Hatch, MD  loratadine-pseudoephedrine (CLARITIN-D 24-HOUR) 10-240 MG 24 hr tablet Take 1 tablet by mouth daily.     [provider]  Multiple Vitamins-Minerals (ADULT ONE DAILY GUMMIES PO) Take 2 tablets by mouth daily.    [provider]  PROAIR HFA 108 (90 Base) MCG/ACT inhaler INHALE 2 PUFFS INTO THE LUNGS EVERY 6 HOURS AS NEEDED FOR WHEEZING OR SHORTNESS OF BREATH Patient taking differently: Inhale 2 puffs into the lungs every 6 (six) hours as needed for wheezing or shortness of breath.  10/14/18   Sheliah Hatch, MD  VITAMIN D PO Take 5,000 Units by mouth daily.     [provider]    Family History Family History  Problem Relation Age of Onset  . Colon cancer Brother 33       chemo, sx   . Hyperlipidemia Mother   . Hypertension Father   . Hyperlipidemia Father   . Heart  attack Father   . Diabetes Father     Social History Social History   Tobacco Use  . Smoking status: Never Smoker  . Smokeless tobacco: Never Used  Substance Use Topics  . Alcohol use: No  . Drug use: No     Allergies   Morphine and related and Neosporin [neomycin-bacitracin zn-polymyx]   Review of Systems Review of Systems Ten systems reviewed and are negative for acute change, except as noted in the HPI.    Physical Exam Updated Vital Signs BP 99/67 (BP Location: Right Arm) Comment: Simultaneous filing. User may not have seen previous data.  Pulse 94 Comment: Simultaneous filing. User may not have seen previous data.  Temp 98.3 F (36.8 C) (Oral)   Resp 18 Comment: Simultaneous filing. User may not have seen previous data.  Ht 5' (1.524 m)   Wt 64.9 kg   SpO2 98% Comment: Simultaneous filing.  User may not have seen previous data.  BMI 27.93 kg/m   Physical Exam Vitals signs and nursing note reviewed.  Constitutional:      General: She is not in acute distress.    Appearance: She is well-developed. She is not diaphoretic.     Comments: Anxious appearing. Nontoxic.   HENT:     Head: Normocephalic and atraumatic.  Eyes:     General: No scleral icterus.    Conjunctiva/sclera: Conjunctivae normal.  Neck:     Musculoskeletal: Normal range of motion.  Pulmonary:     Effort: Pulmonary effort is normal. No respiratory distress.     Comments: Respirations even and unlabored Abdominal:     Comments: Laparoscopic incision sites appear well healed without dehiscence.  Abdomen is soft, nondistended, and largely nontender.  Genitourinary:    Comments: Small/moderate blood and clots in vaginal vault. The vaginal cuff appears intact without dehiscence. There is a slow, constant bleed from bilateral edges of the cuff; L>R. Mucosa appears pink, well perfused. Musculoskeletal: Normal range of motion.  Skin:    General: Skin is warm and dry.     Coloration: Skin is not pale.      Findings: No erythema or rash.  Neurological:     Mental Status: She is alert and oriented to person, place, and time.     Coordination: Coordination normal.  Psychiatric:        Behavior: Behavior normal.      ED Treatments / Results  Labs (all labs ordered are listed, but only abnormal results are displayed) Labs Reviewed  BASIC METABOLIC PANEL - Abnormal; Notable for the following components:      Result Value   Glucose, Bld 104 (*)    All other components within normal limits  CBC WITH DIFFERENTIAL/PLATELET  TYPE AND SCREEN    EKG None  Radiology No results found.  Procedures Procedures (including critical care time)  Medications Ordered in ED Medications  ferric subsulfate (MONSEL'S) solution (has no administration in time range)  white petrolatum (VASELINE) gel (has no administration in time range)  sodium chloride 0.9 % bolus 1,000 mL (0 mLs Intravenous Stopped 12/01/18 0305)  sodium chloride 0.9 % bolus 1,000 mL (1,000 mLs Intravenous New Bag/Given 12/01/18 0310)  silver nitrate applicators applicator 1 application (5 application Topical Given 12/01/18 0311)    12:36 AM Spoke with Dr. Ernestina Penna of [facility].  Discussed intact mucosa with no evidence of dehiscence of the vaginal cuff.  Also described oozing from corners of the cuff without palpable, pulsatile bleeding; more so from left lateral edge of cuff line.  Dr. Algie Coffer believes that source of bleed may be a mucosal tear around the stitches.  She recommends 2-hour observation with repeat speculum exam to assess whether or not bleeding has stopped.  If bleeding has continued, will repeat OB/GYN consultation.  2:56 AM Patient reassessed.  She has soaked through 2 pads while under observation.  A large clot was evacuated from her vaginal vault on repeat pelvic.  There continues to be a slow, constant bleed from the left lateral aspect of the cuff.  Images were obtained during the procedure with the  patient's consent.  See below.      3:10 AM Dr. Ernestina Penna to see patient in the ED. Silver nitrate and Monsel's solution ordered to be at bedside should they be required for hemostasis.  3:30 AM Dr. Ernestina Penna at bedside.  4:10 AM Hemostasis achieved by OBGYN. Stable for discharge with necessary precautions.  Initial Impression / Assessment and Plan / ED Course  I have reviewed the triage vital signs and the nursing notes.  Pertinent labs & imaging results that were available during my care of the patient were reviewed by me and considered in my medical decision making (see chart for details).        38 year old female presents to the emergency department for evaluation of vaginal bleeding 16 days post TLH/BSO.  Bleeding onset around 2000 tonight.  Noted to have bleeding from vaginal cuff on pelvic exam without dehiscence.  Bleeding was not palpable, pulsatile.  This was controlled by OB/GYN at bedside.  Patient has remained hemodynamically stable.  Initial hemoglobin reassuring.  Appropriate for discharge and outpatient OB/GYN follow-up.  Return precautions provided. Patient discharged in stable condition with no unaddressed concerns.   Final Clinical Impressions(s) / ED Diagnoses   Final diagnoses:  Vaginal bleeding  S/P hysterectomy with oophorectomy    ED Discharge Orders    None       Antony MaduraHumes, Tyrece Vanterpool, PA-C 12/01/18 0413    Gilda CreasePollina, Christopher J, MD 12/01/18 [phone]

## 2018-12-01 DIAGNOSIS — N99820 Postprocedural hemorrhage and hematoma of a genitourinary system organ or structure following a genitourinary system procedure: Secondary | ICD-10-CM | POA: Diagnosis not present

## 2018-12-01 LAB — CBC WITH DIFFERENTIAL/PLATELET
Abs Immature Granulocytes: 0.02 10*3/uL (ref 0.00–0.07)
Basophils Absolute: 0.1 10*3/uL (ref 0.0–0.1)
Basophils Relative: 1 %
Eosinophils Absolute: 0.4 10*3/uL (ref 0.0–0.5)
Eosinophils Relative: 4 %
HCT: 39.1 % (ref 36.0–46.0)
Hemoglobin: 13 g/dL (ref 12.0–15.0)
Immature Granulocytes: 0 %
Lymphocytes Relative: 35 %
Lymphs Abs: 3.7 10*3/uL (ref 0.7–4.0)
MCH: 31.5 pg (ref 26.0–34.0)
MCHC: 33.2 g/dL (ref 30.0–36.0)
MCV: 94.7 fL (ref 80.0–100.0)
Monocytes Absolute: 0.8 10*3/uL (ref 0.1–1.0)
Monocytes Relative: 8 %
Neutro Abs: 5.4 10*3/uL (ref 1.7–7.7)
Neutrophils Relative %: 52 %
Platelets: 249 10*3/uL (ref 150–400)
RBC: 4.13 MIL/uL (ref 3.87–5.11)
RDW: 12.2 % (ref 11.5–15.5)
WBC: 10.5 10*3/uL (ref 4.0–10.5)
nRBC: 0 % (ref 0.0–0.2)

## 2018-12-01 LAB — BASIC METABOLIC PANEL
Anion gap: 10 (ref 5–15)
BUN: 18 mg/dL (ref 6–20)
CO2: 23 mmol/L (ref 22–32)
Calcium: 9.1 mg/dL (ref 8.9–10.3)
Chloride: 105 mmol/L (ref 98–111)
Creatinine, Ser: 0.77 mg/dL (ref 0.44–1.00)
GFR calc Af Amer: >60 mL/min (ref >60)
GFR calc non Af Amer: >60 mL/min (ref >60)
Glucose, Bld: 104 mg/dL — ABNORMAL HIGH (ref 70–99)
Potassium: 3.8 mmol/L (ref 3.5–5.1)
Sodium: 138 mmol/L (ref 135–145)

## 2018-12-01 LAB — TYPE AND SCREEN
ABO/RH(D): A POS
Antibody Screen: NEGATIVE

## 2018-12-01 MED ORDER — WHITE PETROLATUM EX OINT
TOPICAL_OINTMENT | CUTANEOUS | Status: DC | PRN
  Administered 2018-12-01: 1 via TOPICAL
  Filled 2018-12-01: qty 5

## 2018-12-01 MED ORDER — SODIUM CHLORIDE 0.9 % IV BOLUS
1000.0000 mL | Freq: Once | INTRAVENOUS | Status: AC
  Administered 2018-12-01: 1000 mL via INTRAVENOUS

## 2018-12-01 MED ORDER — MONSELS FERRIC SUBSULFATE EX SOLN
Freq: Once | CUTANEOUS | Status: DC
  Filled 2018-12-01: qty 8

## 2018-12-01 MED ORDER — SILVER NITRATE-POT NITRATE 75-25 % EX MISC
1.0000 "application " | Freq: Once | CUTANEOUS | Status: AC
  Administered 2018-12-01: 5 via TOPICAL
  Filled 2018-12-01: qty 1

## 2018-12-01 NOTE — Discharge Instructions (Addendum)
Do not place any packing or tampons in your vagina. Do not engage in sexual intercourse until cleared by your OBGYN. Follow up in the office this coming Friday, as scheduled. Return for new or concerning symptoms.

## 2018-12-01 NOTE — Consult Note (Addendum)
Asked to see pt for vaginal bleeding  Pt 16 days after robotic assisted TLH for severe endometriosis and pain. Pt notes routine post-op course until 7p tonight when started with heavy vaginal bleeding and passage of clots. Pt denies intercourse of any PV. Pt notes mild activity and has been having help at home. No cough, no fevers, no dysuria or blood in  Urine.  Past Medical History:  Diagnosis Date  . Anemia   . Asthma    childhood asthma - no inhaler, no problems as adult  . Bipolar disorder (HCC)    dx while in college, no current meds  . Depression    no current meds  . Endometriosis   . Headache    HX  MIGRAINES During menstrual cycle  . Hypothyroidism childhood  . Infertility, female   . Seasonal allergies   . Stroke (HCC) 09/09/15   hadright cerebellar infarct secondary to right vertebral artery dissection-no deficits, no treatment, no problems since 09/2015  . Vertebral artery dissection (HCC) 2017    Past Surgical History:  Procedure Laterality Date  . ANTERIOR CERVICAL DECOMP/DISCECTOMY FUSION N/A 03/08/2017   Procedure: ANTERIOR CERVICAL DECOMPRESSION/DISCECTOMY FUSION C5-6;  Surgeon: Venita Lick, MD;  Location: [facility] OR;  Service: Orthopedics;  Laterality: N/A;  3 hours  . arm surgery Left 1987  . CHOLECYSTECTOMY  10/2017   [place].  . COLONOSCOPY  06/2017   at [facility]  . CYST REMOVAL HAND     CYST REMOVED OFF OVARY - Not Hand  . CYSTOSCOPY WITH STENT PLACEMENT Bilateral 11/14/2018   Procedure: CYSTOSCOPY WITH STENT PLACEMENT;  Surgeon: Bjorn Pippin, MD;  Location: [facility] ORS;  Service: Urology;  Laterality: Bilateral;  . DILITATION & CURRETTAGE/HYSTROSCOPY WITH NOVASURE ABLATION N/A 12/13/2017   Procedure: DILATATION & CURETTAGE/HYSTEROSCOPY WITH HYDROTHERMAL   ABLATION;  Surgeon: Shea Evans, MD;  Location: [facility] ORS;  Service: Gynecology;  Laterality: N/A;  . LAPAROSCOPIC OVARIAN CYSTECTOMY Bilateral 12/13/2017   Procedure: LAPAROSCOPY WITH RIGHT OVARIAN CYSTECTOMY,  ENDOMETRIOSIS ABLATION, AND EXCISION OF LEFT URETERAL IMPLANT;  Surgeon: Shea Evans, MD;  Location: [facility] ORS;  Service: Gynecology;  Laterality: Bilateral;  . OVARIAN CYST REMOVAL  2011   [place] abdominal excision  . ROBOTIC ASSISTED LAPAROSCOPIC HYSTERECTOMY AND SALPINGECTOMY Bilateral 11/14/2018   Procedure: XI ROBOTIC ASSISTED LAPAROSCOPIC HYSTERECTOMY AND BILATERAL SALPINGO-OOPHORECTOMY;  Surgeon: Shea Evans, MD;  Location: [facility] ORS;  Service: Gynecology;  Laterality: Bilateral;  . WISDOM TOOTH EXTRACTION      All: neosporin  PE: Vitals:   12/01/18 0030 12/01/18 0100 12/01/18 0200 12/01/18 0230  BP: 116/75 122/69 118/71 99/67  Pulse: 98 97 97 94  Resp: 16 (!) 22 17 18   Temp:      TempSrc:      SpO2: 100% 98% 99% 98%  Weight:      Height:       Gen: well appearing, no distress Integumentary: watm, dry Abd: soft, NT, no masses, no distension LE: NT, no edema GU: small clot attached to L angle, small but continued trickle of bleeding from L angle. Cuff otherwise intact, no fluid, no evidence dehiscence.  Pressure and cautious use of silver nitrate applied to cuff, bleeding stopped, repeat eval 15 min later and bleeding stopped.  A/P: vaginal bleeding from surgical incision. Controlled.   Lendon Colonel 12/01/2018 3:58 AM    Repeat exam, no further bleeding. Cuff intact. Pt w/o pain. OK to d/c home. NPV. Keep scheduled f/u in office later this week.  45 min spent  on unit dedicated to treatment of this pt.  Lendon ColonelKelly A Demita Tobia 12/01/2018 4:30 AM

## 2018-12-23 ENCOUNTER — Encounter: Payer: Self-pay | Admitting: Family Medicine

## 2018-12-23 DIAGNOSIS — Z8041 Family history of malignant neoplasm of ovary: Secondary | ICD-10-CM | POA: Diagnosis not present

## 2018-12-23 DIAGNOSIS — R922 Inconclusive mammogram: Secondary | ICD-10-CM | POA: Diagnosis not present

## 2018-12-23 DIAGNOSIS — N6011 Diffuse cystic mastopathy of right breast: Secondary | ICD-10-CM | POA: Diagnosis not present

## 2018-12-23 DIAGNOSIS — N6012 Diffuse cystic mastopathy of left breast: Secondary | ICD-10-CM | POA: Diagnosis not present

## 2018-12-23 LAB — HM MAMMOGRAPHY

## 2018-12-25 ENCOUNTER — Encounter: Payer: Self-pay | Admitting: General Practice

## 2019-01-01 DIAGNOSIS — G43009 Migraine without aura, not intractable, without status migrainosus: Secondary | ICD-10-CM | POA: Insufficient documentation

## 2019-01-01 DIAGNOSIS — Z8673 Personal history of transient ischemic attack (TIA), and cerebral infarction without residual deficits: Secondary | ICD-10-CM | POA: Diagnosis not present

## 2019-01-01 DIAGNOSIS — G4484 Primary exertional headache: Secondary | ICD-10-CM | POA: Insufficient documentation

## 2019-01-01 DIAGNOSIS — I7774 Dissection of vertebral artery: Secondary | ICD-10-CM | POA: Diagnosis not present

## 2019-01-09 DIAGNOSIS — E038 Other specified hypothyroidism: Secondary | ICD-10-CM | POA: Diagnosis not present

## 2019-01-09 DIAGNOSIS — D518 Other vitamin B12 deficiency anemias: Secondary | ICD-10-CM | POA: Diagnosis not present

## 2019-01-09 DIAGNOSIS — E559 Vitamin D deficiency, unspecified: Secondary | ICD-10-CM | POA: Diagnosis not present

## 2019-01-09 DIAGNOSIS — Z79899 Other long term (current) drug therapy: Secondary | ICD-10-CM | POA: Diagnosis not present

## 2019-01-09 DIAGNOSIS — N951 Menopausal and female climacteric states: Secondary | ICD-10-CM | POA: Diagnosis not present

## 2019-01-14 ENCOUNTER — Other Ambulatory Visit: Payer: Self-pay

## 2019-01-14 ENCOUNTER — Ambulatory Visit
Admission: RE | Admit: 2019-01-14 | Discharge: 2019-01-14 | Disposition: A | Discharge status: Home / Self Care | Payer: BC Managed Care – PPO | Source: Ambulatory Visit | Attending: Neurology | Admitting: Neurology

## 2019-01-14 DIAGNOSIS — Z8673 Personal history of transient ischemic attack (TIA), and cerebral infarction without residual deficits: Secondary | ICD-10-CM

## 2019-01-14 DIAGNOSIS — I6502 Occlusion and stenosis of left vertebral artery: Secondary | ICD-10-CM | POA: Diagnosis not present

## 2019-01-14 DIAGNOSIS — I7774 Dissection of vertebral artery: Secondary | ICD-10-CM

## 2019-01-14 MED ORDER — IOPAMIDOL (ISOVUE-370) INJECTION 76%
80.0000 mL | Freq: Once | INTRAVENOUS | Status: AC | PRN
  Administered 2019-01-14: 80 mL via INTRAVENOUS

## 2019-01-15 ENCOUNTER — Telehealth: Payer: Self-pay

## 2019-01-15 NOTE — Telephone Encounter (Signed)
-----   Message from Pieter Partridge, DO sent at 01/15/2019  2:44 PM EDT ----- CTA shows no changes or new findings.  It looks stable

## 2019-01-15 NOTE — Telephone Encounter (Signed)
Called and LMOVM advising Pt of results, advising they were also available on My Chart and to call with any questions.

## 2019-01-19 ENCOUNTER — Other Ambulatory Visit: Payer: Self-pay | Admitting: Neurology

## 2019-01-20 ENCOUNTER — Encounter: Payer: Self-pay | Admitting: Family Medicine

## 2019-01-22 ENCOUNTER — Encounter: Payer: Self-pay | Admitting: Family Medicine

## 2019-01-22 ENCOUNTER — Encounter: Payer: Self-pay | Admitting: General Practice

## 2019-01-28 ENCOUNTER — Other Ambulatory Visit: Payer: Self-pay | Admitting: Family Medicine

## 2019-01-29 NOTE — Progress Notes (Addendum)
Virtual Visit via Video Note The purpose of this virtual visit is to provide medical care while limiting exposure to the novel coronavirus.    Consent was obtained for video visit:  Yes Answered questions that patient had about telehealth interaction:  Yes I discussed the limitations, risks, security and privacy concerns of performing an evaluation and management service by telemedicine. I also discussed with the patient that there may be a patient responsible charge related to this service. The patient expressed understanding and agreed to proceed.  Pt location: Home Physician Location: Home Name of referring provider:  Sheliah Hatchabori, Katherine E, MD I connected with Wiletta T Messinger at patients initiation/request on 01/30/2019 at 10:10 AM EDT by video enabled telemedicine application and verified that I am speaking with the correct person using two identifiers. Pt MRN:  [id number] Pt DOB:  01/13/1981 Video Participants:  Teresa Lemmingsawn T Parrilla   History of Present Illness:  Teresa LemmingsDawn Guzman is a 38 year old right-handed Caucasian woman with hypothyroidism, asthma, and history of right cerebellar infarct secondary to right vertebral artery dissection who follows up for exertional headache and left vertebral artery dissection.   UPDATE: She saw Dr. Alleen BorneMatthew Ehrlich [facility] for a second opinion.  If repeat CTA is again abnormal, he recommended catheter-angiogram for evaluation of direction of flow and for fibromuscular dysplasia.  He recommended restarting statin for LDL goal less than 70.  LDL from February was 84.  Repeat CTA of neck from 01/14/19 was personally reviewed and redemonstrated left vertebral artery occlusion 1 cm beyond its origin from the arch with reconstitution at the C5-6 level by cervical collaterals with patency beyond that.  There is also dilatation of the left vertebral artery at the C2-3 level with diameter of 4.5 mm, stable compared to imaging prior to the left vertebral artery dissection and  therefore believed by radiology not to represent a true fusiform dilatation.  Right vertebral artery is normal.  Headaches:  She has a menstrual migraine for 2 days.  She takes indomethacin 50mg  prior to any activity.  She recently restarted going to the gym with light activity.    HISTORY: In February 2017, she sustained a right cerebellar stroke secondary to right vertebral dissection.She was falling to the right and became plegic on the right side.[facility] was [facility].MRI of brain revealed acute right cerebellar infarct.MRA neck revealed right vertebral artery stenosis.Follow up CTA of neck confirmed right vertebral artery focal dissection at C1-2.There was segment irregularity in the right vertebral artery which raised suspicion of fibromuscular dysplasia.Echo was normal.She had nonspecific elevation of ANA.Hgb A1c 5.7%.LDL was 76.She was started on Lipitor 10mg  daily.She was started on ASA 325mg  and Plavix 75mg  daily. Repeat MRA of the neck from 11/25/15 appears unchanged, revealing right vertebral dissection but vessel is patent and without pseudoaneurysm. However, it is difficult to really visualize the vessel since it is small. CTA of neck from 12/29/15 was personally reviewed and revealed that the focal dissection had resolved. Lipitor and Plavix were subsequently discontinued and she was continued on ASA.  Regarding the elevated ANA, she followed up with rheumatology and workup was negative.  Following the stroke, she was experiencing right sided headache.She previously responded to nortriptyline 50mg .  She also has migraines presenting as severe right frontal throbbing pain associated with nausea, vomiting, photophobia and phonophobia. No associated visual disturbance or unilateral numbness or weakness. Historically, they are menstrual-related, occurring daily for 2 weeks beginning the week of her menstrual period.  In May 2018, she started  experiencing left posterior neck pain which radiates as a throbbing mild to moderate pain down the lateral aspect of her left arm up to just below the elbow. For the first two days, she noted numbness and tingling in the last 2 fingers of her left hand, but that has since resolved. She denies any weakness, ataxia or visual disturbance. The pain is constant. Looking up or down, or turning her head to the right exacerbates the pain. Holding her elbow with her arm crossed helps relieve the throbbing arm pain. She stopped exercising at that time. She tried muscle relaxants, stretches, heat, ice, acupuncture and has seen a chiropractor (without high-velocity, low amplitude techniques or manipulation), which have been ineffective.  She was diagnosed with left sided cervical radiculopathy. She was started on gabapentin to treat radicular pain and headache. For menstrual migraine, she was prescribed a perimenstrual prophylaxis of naproxen 500mg  twice daily for 2 weeks, starting one week prior to onset of period. MRI of cervical spine from 01/26/17 was personally reviewed and demonstrated disc osteophyte complex causing severe left C5-6 neural foraminal stenosis with impingement of C6 nerve root. She was advised to follow up with surgery. She underwent ACDF C5-6 on 03/08/17.   In September 2019, she started getting a headache while working out at [employer] gym. It would occur 1 to 2 hours after she works out. Even mildly strenuous activity would trigger it. They start in back of neck on the right and radiate up to behind the right eye. It lasts until she goes to bed and it is resolved when she wakes up. No associated visual disturbance, slurred speech, vertigo or unilateral numbness or weakness. No radicular pain down the right arm.  To evaluate new onset headaches, she had a CTA of the head and neck performed on 05/15/18 which was personally reviewed and demonstrated occluded left vertebral artery 10 mm beyond  its origin but reconstituted at C5-C6 and remaining patent to the vertebrobasilar junction with patent left PICA.  Right vertebral artery appears improved compared to prior imaging in 2017.  She had a follow up MRI of the brain on 05/25/18, which was personally reviewed and demonstrated remote right cerebellar infarct but no new infarct corresponding the the occluded left vertebral artery.  Vasculitis workup, including ANA, Sed Rate, ANCA and Sjogren's antibodies, was unremarkable.  She was advised to restart ASA 81mg  daily.  She was started on propranolol 60mg  for headaches but it was ineffective.  I recommended Aimovig but she has not started it.  Headaches are menstrual related.  She started light exercise a few weeks ago.  She does cross fit but no weights or running.  She started indomethacin 30 days prior to going to the gym, which has been effective.  CTA of abdomen and pelvis from 10/11/18 showed no evidence of renal artery fibromuscular dysplasia.  Prior treatment for headaches (menstrual migraines and exertional headaches): Propranolol ER 60mg  (discontinued due to dizziness) She tried topiramate but stopped after 2 months because ineffective. Naproxen perimenstrual prophylaxis (ineffective)  Past Medical History: Past Medical History:  Diagnosis Date  . Anemia   . Asthma    childhood asthma - no inhaler, no problems as adult  . Bipolar disorder (HCC)    dx while in college, no current meds  . Depression    no current meds  . Endometriosis   . Headache    HX  MIGRAINES During menstrual cycle  . Hypothyroidism childhood  . Infertility,  female   . Seasonal allergies   . Stroke ([place]) 09/09/15   hadright cerebellar infarct secondary to right vertebral artery dissection-no deficits, no treatment, no problems since 09/2015  . Vertebral artery dissection ([place]) 2017    Medications: Outpatient Encounter Medications as of 01/30/2019  Medication Sig  . acetaminophen (TYLENOL) 325 MG tablet  Take 2 tablets (650 mg total) by mouth every 4 (four) hours as needed for mild pain (temperature > 101.5.).  Marland Kitchen aspirin EC 81 MG tablet Take 81 mg by mouth daily.  . Azelastine HCl 0.15 % SOLN USE 1 SPRAYS IN EACH NOSTRIL TWICE DAILY (Patient taking differently: Place 1 spray into both nostrils daily. )  . fluticasone (FLONASE) 50 MCG/ACT nasal spray USE 1 TO 2 SPRAYS INTO THE  NOSE DAILY (Patient taking differently: Place 1 spray into both nostrils 2 (two) times daily. )  . ibuprofen (ADVIL,MOTRIN) 600 MG tablet Take 1 tablet (600 mg total) by mouth every 8 (eight) hours.  . indomethacin (INDOCIN) 50 MG capsule TAKE 1 CAPSULE BY MOUTH 30 TO 60 MINUTES BEFORE EXERCISE  . levothyroxine (SYNTHROID) 112 MCG tablet TAKE 1 TABLET(112 MCG) BY MOUTH DAILY  . loratadine-pseudoephedrine (CLARITIN-D 24-HOUR) 10-240 MG 24 hr tablet Take 1 tablet by mouth daily.   . Multiple Vitamins-Minerals (ADULT ONE DAILY GUMMIES PO) Take 2 tablets by mouth daily.  Marland Kitchen PROAIR HFA 108 (90 Base) MCG/ACT inhaler INHALE 2 PUFFS INTO THE LUNGS EVERY 6 HOURS AS NEEDED FOR WHEEZING OR SHORTNESS OF BREATH (Patient taking differently: Inhale 2 puffs into the lungs every 6 (six) hours as needed for wheezing or shortness of breath. )  . VITAMIN D PO Take 5,000 Units by mouth daily.    No facility-administered encounter medications on file as of 01/30/2019.     Allergies: Allergies  Allergen Reactions  . Morphine And Related Other (See Comments)    Headache for a long time per pt  . Neosporin [Neomycin-Bacitracin Zn-Polymyx] Rash    Family History: Family History  Problem Relation Age of Onset  . Colon cancer Brother 33       chemo, sx   . Hyperlipidemia Mother   . Hypertension Father   . Hyperlipidemia Father   . Heart attack Father   . Diabetes Father     Social History: Social History   Socioeconomic History  . Marital status: Married    Spouse name: Not on file  . Number of children: Not on file  . Years of  education: Not on file  . Highest education level: Not on file  Occupational History  . Not on file  Social Needs  . Financial resource strain: Not on file  . Food insecurity    Worry: Not on file    Inability: Not on file  . Transportation needs    Medical: Not on file    Non-medical: Not on file  Tobacco Use  . Smoking status: Never Smoker  . Smokeless tobacco: Never Used  Substance and Sexual Activity  . Alcohol use: No  . Drug use: No  . Sexual activity: Yes    Birth control/protection: None  Lifestyle  . Physical activity    Days per week: Not on file    Minutes per session: Not on file  . Stress: Not on file  Relationships  . Social Herbalist on phone: Not on file    Gets together: Not on file    Attends religious service: Not on file  Active member of club or organization: Not on file    Attends meetings of clubs or organizations: Not on file    Relationship status: Not on file  . Intimate partner violence    Fear of current or ex partner: Not on file    Emotionally abused: Not on file    Physically abused: Not on file    Forced sexual activity: Not on file  Other Topics Concern  . Not on file  Social History Narrative  . Not on file   Observations/Objective:   Height 5' (1.524 m), weight 145 lb (65.8 kg), last menstrual period 05/08/2018. No acute distress.  Alert and oriented.  Speech fluent and not dysarthric.  Language intact.  Face symmetric.     Assessment and Plan:   1.  Left vertebral artery dissection with prior history of right vertebral artery dissection, raising suspicion of fibromuscular dysplasia.  Workup negative for vasculitis.  She does not exhibit symptoms of a connective tissue disease such as Ehlers-Danlos syndrome.  Based on CTA of head, neck and abdomen/pelvis, she does not exhibit evidence of fibromuscular dysplasia.  Recommend life-long ASA.  She defers referral for angiogram at this time as her deductible will restart in  a week or two. 2.  Exertional headache, stable 3.  Menstrual migraines, stable  1.  Continue ASA 81mg  daily.   2.  Will start simvastatin 5mg  daily for LDL goal less than 70. 3.  Indomethacin as needed prior to any exertional activity.  Advised caution with activity. 4.  Repeat CTA of neck in 6 months.  Repeat lipid panel in 6 months 5.  She will follow up in 6 months after repeating imaging.  At that time, will revisit referral for angiogram  Follow Up Instructions:    -I discussed the assessment and treatment plan with the patient. The patient was provided an opportunity to ask questions and all were answered. The patient agreed with the plan and demonstrated an understanding of the instructions.   The patient was advised to call back or seek an in-person evaluation if the symptoms worsen or if the condition fails to improve as anticipated.    Total Time spent in visit with the patient was:  11 minutes   Cira ServantAdam Robert Jaffe, DO

## 2019-01-30 ENCOUNTER — Other Ambulatory Visit: Payer: Self-pay

## 2019-01-30 ENCOUNTER — Encounter: Payer: Self-pay | Admitting: Neurology

## 2019-01-30 ENCOUNTER — Telehealth (INDEPENDENT_AMBULATORY_CARE_PROVIDER_SITE_OTHER): Payer: BC Managed Care – PPO | Admitting: Neurology

## 2019-01-30 ENCOUNTER — Encounter: Payer: Self-pay | Admitting: Family Medicine

## 2019-01-30 VITALS — Ht 60.0 in | Wt 145.0 lb

## 2019-01-30 DIAGNOSIS — I7774 Dissection of vertebral artery: Secondary | ICD-10-CM | POA: Diagnosis not present

## 2019-01-30 DIAGNOSIS — G43809 Other migraine, not intractable, without status migrainosus: Secondary | ICD-10-CM

## 2019-01-30 MED ORDER — SIMVASTATIN 5 MG PO TABS: 5.0000 mg | ORAL_TABLET | Freq: Every day | ORAL | 5 refills | Status: DC

## 2019-01-30 NOTE — Addendum Note (Signed)
Addended by: Amado Coe on: 01/30/2019 10:28 AM   Modules accepted: Orders

## 2019-01-30 NOTE — Addendum Note (Signed)
Addended byTomi Likens, Kimbella Heisler R on: 01/30/2019 10:22 AM   Modules accepted: Orders

## 2019-02-26 DIAGNOSIS — N951 Menopausal and female climacteric states: Secondary | ICD-10-CM | POA: Diagnosis not present

## 2019-03-11 DIAGNOSIS — G43839 Menstrual migraine, intractable, without status migrainosus: Secondary | ICD-10-CM | POA: Diagnosis not present

## 2019-03-11 DIAGNOSIS — G43719 Chronic migraine without aura, intractable, without status migrainosus: Secondary | ICD-10-CM | POA: Diagnosis not present

## 2019-03-11 DIAGNOSIS — G4484 Primary exertional headache: Secondary | ICD-10-CM | POA: Diagnosis not present

## 2019-03-11 DIAGNOSIS — Z049 Encounter for examination and observation for unspecified reason: Secondary | ICD-10-CM | POA: Diagnosis not present

## 2019-03-18 DIAGNOSIS — G43839 Menstrual migraine, intractable, without status migrainosus: Secondary | ICD-10-CM | POA: Diagnosis not present

## 2019-03-18 DIAGNOSIS — G43719 Chronic migraine without aura, intractable, without status migrainosus: Secondary | ICD-10-CM | POA: Diagnosis not present

## 2019-03-18 DIAGNOSIS — M542 Cervicalgia: Secondary | ICD-10-CM | POA: Diagnosis not present

## 2019-03-18 DIAGNOSIS — G4484 Primary exertional headache: Secondary | ICD-10-CM | POA: Diagnosis not present

## 2019-04-01 DIAGNOSIS — G43719 Chronic migraine without aura, intractable, without status migrainosus: Secondary | ICD-10-CM | POA: Diagnosis not present

## 2019-04-01 DIAGNOSIS — M542 Cervicalgia: Secondary | ICD-10-CM | POA: Diagnosis not present

## 2019-04-01 DIAGNOSIS — G4484 Primary exertional headache: Secondary | ICD-10-CM | POA: Diagnosis not present

## 2019-04-01 DIAGNOSIS — G43839 Menstrual migraine, intractable, without status migrainosus: Secondary | ICD-10-CM | POA: Diagnosis not present

## 2019-04-15 DIAGNOSIS — G43719 Chronic migraine without aura, intractable, without status migrainosus: Secondary | ICD-10-CM | POA: Diagnosis not present

## 2019-04-15 DIAGNOSIS — M542 Cervicalgia: Secondary | ICD-10-CM | POA: Diagnosis not present

## 2019-04-15 DIAGNOSIS — G43839 Menstrual migraine, intractable, without status migrainosus: Secondary | ICD-10-CM | POA: Diagnosis not present

## 2019-04-15 DIAGNOSIS — G4484 Primary exertional headache: Secondary | ICD-10-CM | POA: Diagnosis not present

## 2019-04-28 DIAGNOSIS — J019 Acute sinusitis, unspecified: Secondary | ICD-10-CM | POA: Diagnosis not present

## 2019-04-29 DIAGNOSIS — G43839 Menstrual migraine, intractable, without status migrainosus: Secondary | ICD-10-CM | POA: Diagnosis not present

## 2019-04-29 DIAGNOSIS — M542 Cervicalgia: Secondary | ICD-10-CM | POA: Diagnosis not present

## 2019-04-29 DIAGNOSIS — G43719 Chronic migraine without aura, intractable, without status migrainosus: Secondary | ICD-10-CM | POA: Diagnosis not present

## 2019-05-13 DIAGNOSIS — M542 Cervicalgia: Secondary | ICD-10-CM | POA: Diagnosis not present

## 2019-05-13 DIAGNOSIS — G4484 Primary exertional headache: Secondary | ICD-10-CM | POA: Diagnosis not present

## 2019-05-13 DIAGNOSIS — G43719 Chronic migraine without aura, intractable, without status migrainosus: Secondary | ICD-10-CM | POA: Diagnosis not present

## 2019-05-13 DIAGNOSIS — G43839 Menstrual migraine, intractable, without status migrainosus: Secondary | ICD-10-CM | POA: Diagnosis not present

## 2019-05-20 ENCOUNTER — Other Ambulatory Visit: Payer: Self-pay | Admitting: General Practice

## 2019-05-20 MED ORDER — LEVOTHYROXINE SODIUM 112 MCG PO TABS: ORAL_TABLET | ORAL | 3 refills | Status: DC

## 2019-06-03 DIAGNOSIS — G43719 Chronic migraine without aura, intractable, without status migrainosus: Secondary | ICD-10-CM | POA: Diagnosis not present

## 2019-06-03 DIAGNOSIS — G43839 Menstrual migraine, intractable, without status migrainosus: Secondary | ICD-10-CM | POA: Diagnosis not present

## 2019-06-03 DIAGNOSIS — M542 Cervicalgia: Secondary | ICD-10-CM | POA: Diagnosis not present

## 2019-06-03 DIAGNOSIS — G4484 Primary exertional headache: Secondary | ICD-10-CM | POA: Diagnosis not present

## 2019-06-25 DIAGNOSIS — Z8041 Family history of malignant neoplasm of ovary: Secondary | ICD-10-CM | POA: Diagnosis not present

## 2019-06-25 DIAGNOSIS — N6012 Diffuse cystic mastopathy of left breast: Secondary | ICD-10-CM | POA: Diagnosis not present

## 2019-06-25 DIAGNOSIS — N6011 Diffuse cystic mastopathy of right breast: Secondary | ICD-10-CM | POA: Diagnosis not present

## 2019-06-25 DIAGNOSIS — R928 Other abnormal and inconclusive findings on diagnostic imaging of breast: Secondary | ICD-10-CM | POA: Diagnosis not present

## 2019-07-01 DIAGNOSIS — M542 Cervicalgia: Secondary | ICD-10-CM | POA: Diagnosis not present

## 2019-07-01 DIAGNOSIS — G43719 Chronic migraine without aura, intractable, without status migrainosus: Secondary | ICD-10-CM | POA: Diagnosis not present

## 2019-07-01 DIAGNOSIS — G43839 Menstrual migraine, intractable, without status migrainosus: Secondary | ICD-10-CM | POA: Diagnosis not present

## 2019-08-12 DIAGNOSIS — G43719 Chronic migraine without aura, intractable, without status migrainosus: Secondary | ICD-10-CM | POA: Diagnosis not present

## 2019-08-12 DIAGNOSIS — G43839 Menstrual migraine, intractable, without status migrainosus: Secondary | ICD-10-CM | POA: Diagnosis not present

## 2019-08-12 DIAGNOSIS — M542 Cervicalgia: Secondary | ICD-10-CM | POA: Diagnosis not present

## 2019-08-20 ENCOUNTER — Ambulatory Visit: Payer: BC Managed Care – PPO | Admitting: Neurology

## 2019-08-21 ENCOUNTER — Ambulatory Visit: Payer: BC Managed Care – PPO | Admitting: Neurology

## 2019-09-02 DIAGNOSIS — J301 Allergic rhinitis due to pollen: Secondary | ICD-10-CM | POA: Diagnosis not present

## 2019-09-02 DIAGNOSIS — J019 Acute sinusitis, unspecified: Secondary | ICD-10-CM | POA: Diagnosis not present

## 2019-09-21 ENCOUNTER — Other Ambulatory Visit: Payer: Self-pay | Admitting: Neurology

## 2019-09-23 DIAGNOSIS — G4484 Primary exertional headache: Secondary | ICD-10-CM | POA: Diagnosis not present

## 2019-09-23 DIAGNOSIS — G43719 Chronic migraine without aura, intractable, without status migrainosus: Secondary | ICD-10-CM | POA: Diagnosis not present

## 2019-09-23 DIAGNOSIS — G43839 Menstrual migraine, intractable, without status migrainosus: Secondary | ICD-10-CM | POA: Diagnosis not present

## 2019-09-23 DIAGNOSIS — M542 Cervicalgia: Secondary | ICD-10-CM | POA: Diagnosis not present

## 2019-09-24 ENCOUNTER — Other Ambulatory Visit: Payer: Self-pay | Admitting: General Practice

## 2019-09-24 ENCOUNTER — Encounter: Payer: Self-pay | Admitting: Family Medicine

## 2019-09-24 MED ORDER — LEVOTHYROXINE SODIUM 112 MCG PO TABS: ORAL_TABLET | ORAL | 3 refills | Status: DC

## 2019-10-02 ENCOUNTER — Ambulatory Visit (INDEPENDENT_AMBULATORY_CARE_PROVIDER_SITE_OTHER): Payer: BC Managed Care – PPO | Admitting: Family Medicine

## 2019-10-02 ENCOUNTER — Other Ambulatory Visit: Payer: Self-pay

## 2019-10-02 ENCOUNTER — Encounter: Payer: Self-pay | Admitting: Family Medicine

## 2019-10-02 VITALS — BP 112/76 | HR 77 | Temp 97.3°F | Resp 16 | Ht 60.0 in | Wt 147.5 lb

## 2019-10-02 DIAGNOSIS — Z0000 Encounter for general adult medical examination without abnormal findings: Secondary | ICD-10-CM

## 2019-10-02 DIAGNOSIS — E039 Hypothyroidism, unspecified: Secondary | ICD-10-CM | POA: Diagnosis not present

## 2019-10-02 DIAGNOSIS — E559 Vitamin D deficiency, unspecified: Secondary | ICD-10-CM | POA: Diagnosis not present

## 2019-10-02 NOTE — Assessment & Plan Note (Signed)
Check labs and replete prn. 

## 2019-10-02 NOTE — Assessment & Plan Note (Signed)
Chronic problem.  Currently asymptomatic.  Check labs.  Adjust meds prn  

## 2019-10-02 NOTE — Progress Notes (Signed)
   Subjective:    Patient ID: Teresa Guzman, female    DOB: 23-Jan-1981, 39 y.o.   MRN: [id number]  HPI CPE- UTD on mammo, colonoscopy, no need for pap due to TAH.  UTD on Tdap.  Declines flu.  Exercising regularly and not losing weight- which is frustrating for her.   Review of Systems Patient reports no vision/ hearing changes, adenopathy,fever, weight change,  persistant/recurrent hoarseness , swallowing issues, chest pain, palpitations, edema, persistant/recurrent cough, hemoptysis, dyspnea (rest/exertional/paroxysmal nocturnal), gastrointestinal bleeding (melena, rectal bleeding), abdominal pain, significant heartburn, bowel changes, GU symptoms (dysuria, hematuria, incontinence), Gyn symptoms (abnormal  bleeding, pain),  syncope, focal weakness, memory loss, numbness & tingling, skin/hair/nail changes, abnormal bruising or bleeding, anxiety, or depression.   This visit occurred during the SARS-CoV-2 public health emergency.  Safety protocols were in place, including screening questions prior to the visit, additional usage of staff PPE, and extensive cleaning of exam room while observing appropriate contact time as indicated for disinfecting solutions.       Objective:   Physical Exam General Appearance:    Alert, cooperative, no distress, appears stated age  Head:    Normocephalic, without obvious abnormality, atraumatic  Eyes:    PERRL, conjunctiva/corneas clear, EOM's intact, fundi    benign, both eyes  Ears:    Normal TM's and external ear canals, both ears  Nose:   Deferred due to COVID  Throat:   Neck:   Supple, symmetrical, trachea midline, no adenopathy;    Thyroid: no enlargement/tenderness/nodules  Back:     Symmetric, no curvature, ROM normal, no CVA tenderness  Lungs:     Clear to auscultation bilaterally, respirations unlabored  Chest Wall:    No tenderness or deformity   Heart:    Regular rate and rhythm, S1 and S2 normal, no murmur, rub   or gallop  Breast Exam:     Deferred to GYN  Abdomen:     Soft, non-tender, bowel sounds active all four quadrants,    no masses, no organomegaly  Genitalia:    Deferred to GYN  Rectal:    Extremities:   Extremities normal, atraumatic, no cyanosis or edema  Pulses:   2+ and symmetric all extremities  Skin:   Skin color, texture, turgor normal, no rashes or lesions  Lymph nodes:   Cervical, supraclavicular, and axillary nodes normal  Neurologic:   CNII-XII intact, normal strength, sensation and reflexes    throughout          Assessment & Plan:

## 2019-10-02 NOTE — Assessment & Plan Note (Signed)
Pt's PE WNL w/ exception of BMI.  UTD on Tdap, mammo.  Declines flu.  Check labs.  Anticipatory guidance provided.

## 2019-10-02 NOTE — Patient Instructions (Addendum)
Follow up in 1 year or as needed We'll notify you of your lab results and make any changes if needed Continue to work on healthy diet and regular exercise- you're doing great!! Schedule your mammogram for May when it is due Call with any questions or concerns Stay Safe!  Stay Healthy!

## 2019-10-03 LAB — CBC WITH DIFFERENTIAL/PLATELET
Basophils Absolute: 0.1 10*3/uL (ref 0.0–0.1)
Basophils Relative: 0.8 % (ref 0.0–3.0)
Eosinophils Absolute: 0.1 10*3/uL (ref 0.0–0.7)
Eosinophils Relative: 1 % (ref 0.0–5.0)
HCT: 41.3 % (ref 36.0–46.0)
Hemoglobin: 13.9 g/dL (ref 12.0–15.0)
Lymphocytes Relative: 21 % (ref 12.0–46.0)
Lymphs Abs: 2.2 10*3/uL (ref 0.7–4.0)
MCHC: 33.7 g/dL (ref 30.0–36.0)
MCV: 94.5 fl (ref 78.0–100.0)
Monocytes Absolute: 0.7 10*3/uL (ref 0.1–1.0)
Monocytes Relative: 6.4 % (ref 3.0–12.0)
Neutro Abs: 7.4 10*3/uL (ref 1.4–7.7)
Neutrophils Relative %: 70.8 % (ref 43.0–77.0)
Platelets: 227 10*3/uL (ref 150.0–400.0)
RBC: 4.37 Mil/uL (ref 3.87–5.11)
RDW: 12.9 % (ref 11.5–15.5)
WBC: 10.5 10*3/uL (ref 4.0–10.5)

## 2019-10-03 LAB — BASIC METABOLIC PANEL
BUN: 16 mg/dL (ref 6–23)
CO2: 27 mEq/L (ref 19–32)
Calcium: 9.6 mg/dL (ref 8.4–10.5)
Chloride: 104 mEq/L (ref 96–112)
Creatinine, Ser: 1.08 mg/dL (ref 0.40–1.20)
GFR: 56.1 mL/min — ABNORMAL LOW (ref >60.00)
Glucose, Bld: 100 mg/dL — ABNORMAL HIGH (ref 70–99)
Potassium: 4.2 mEq/L (ref 3.5–5.1)
Sodium: 137 mEq/L (ref 135–145)

## 2019-10-03 LAB — HEPATIC FUNCTION PANEL
ALT: 15 U/L (ref 0–35)
AST: 21 U/L (ref 0–37)
Albumin: 4.3 g/dL (ref 3.5–5.2)
Alkaline Phosphatase: 70 U/L (ref 39–117)
Bilirubin, Direct: 0.1 mg/dL (ref 0.0–0.3)
Total Bilirubin: 0.3 mg/dL (ref 0.2–1.2)
Total Protein: 7 g/dL (ref 6.0–8.3)

## 2019-10-03 LAB — VITAMIN D 25 HYDROXY (VIT D DEFICIENCY, FRACTURES): VITD: 64.54 ng/mL (ref 30.00–100.00)

## 2019-10-03 LAB — LIPID PANEL
Cholesterol: 163 mg/dL (ref 0–200)
HDL: 42.9 mg/dL (ref >39.00)
LDL Cholesterol: 89 mg/dL (ref 0–99)
NonHDL: 120.41
Total CHOL/HDL Ratio: 4
Triglycerides: 156 mg/dL — ABNORMAL HIGH (ref 0.0–149.0)
VLDL: 31.2 mg/dL (ref 0.0–40.0)

## 2019-10-03 LAB — TSH: TSH: 0.54 u[IU]/mL (ref 0.35–4.50)

## 2019-10-21 ENCOUNTER — Encounter: Payer: Self-pay | Admitting: Family Medicine

## 2019-10-21 DIAGNOSIS — M778 Other enthesopathies, not elsewhere classified: Secondary | ICD-10-CM

## 2019-10-28 DIAGNOSIS — M25521 Pain in right elbow: Secondary | ICD-10-CM | POA: Diagnosis not present

## 2019-11-04 DIAGNOSIS — M542 Cervicalgia: Secondary | ICD-10-CM | POA: Diagnosis not present

## 2019-11-04 DIAGNOSIS — G43839 Menstrual migraine, intractable, without status migrainosus: Secondary | ICD-10-CM | POA: Diagnosis not present

## 2019-11-04 DIAGNOSIS — G43719 Chronic migraine without aura, intractable, without status migrainosus: Secondary | ICD-10-CM | POA: Diagnosis not present

## 2019-11-06 DIAGNOSIS — M25521 Pain in right elbow: Secondary | ICD-10-CM | POA: Diagnosis not present

## 2019-12-16 DIAGNOSIS — G43719 Chronic migraine without aura, intractable, without status migrainosus: Secondary | ICD-10-CM | POA: Diagnosis not present

## 2019-12-16 DIAGNOSIS — G43839 Menstrual migraine, intractable, without status migrainosus: Secondary | ICD-10-CM | POA: Diagnosis not present

## 2019-12-16 DIAGNOSIS — M542 Cervicalgia: Secondary | ICD-10-CM | POA: Diagnosis not present

## 2019-12-16 DIAGNOSIS — G4484 Primary exertional headache: Secondary | ICD-10-CM | POA: Diagnosis not present

## 2019-12-19 ENCOUNTER — Other Ambulatory Visit: Payer: Self-pay

## 2019-12-19 ENCOUNTER — Encounter (HOSPITAL_COMMUNITY): Payer: Self-pay

## 2019-12-19 ENCOUNTER — Observation Stay (HOSPITAL_COMMUNITY)
Admission: EM | Admit: 2019-12-19 | Discharge: 2019-12-20 | Disposition: A | Discharge status: Home / Self Care | Payer: BC Managed Care – PPO | Attending: Family Medicine | Admitting: Family Medicine

## 2019-12-19 DIAGNOSIS — R2681 Unsteadiness on feet: Secondary | ICD-10-CM | POA: Diagnosis not present

## 2019-12-19 DIAGNOSIS — J4520 Mild intermittent asthma, uncomplicated: Secondary | ICD-10-CM | POA: Diagnosis not present

## 2019-12-19 DIAGNOSIS — R42 Dizziness and giddiness: Secondary | ICD-10-CM | POA: Insufficient documentation

## 2019-12-19 DIAGNOSIS — Z03818 Encounter for observation for suspected exposure to other biological agents ruled out: Secondary | ICD-10-CM | POA: Diagnosis not present

## 2019-12-19 DIAGNOSIS — G43009 Migraine without aura, not intractable, without status migrainosus: Secondary | ICD-10-CM

## 2019-12-19 DIAGNOSIS — J45909 Unspecified asthma, uncomplicated: Secondary | ICD-10-CM | POA: Insufficient documentation

## 2019-12-19 DIAGNOSIS — R270 Ataxia, unspecified: Secondary | ICD-10-CM | POA: Diagnosis not present

## 2019-12-19 DIAGNOSIS — E039 Hypothyroidism, unspecified: Secondary | ICD-10-CM | POA: Diagnosis present

## 2019-12-19 DIAGNOSIS — F319 Bipolar disorder, unspecified: Secondary | ICD-10-CM | POA: Insufficient documentation

## 2019-12-19 DIAGNOSIS — Z8673 Personal history of transient ischemic attack (TIA), and cerebral infarction without residual deficits: Secondary | ICD-10-CM | POA: Insufficient documentation

## 2019-12-19 DIAGNOSIS — I10 Essential (primary) hypertension: Secondary | ICD-10-CM | POA: Diagnosis not present

## 2019-12-19 DIAGNOSIS — R1110 Vomiting, unspecified: Secondary | ICD-10-CM | POA: Insufficient documentation

## 2019-12-19 DIAGNOSIS — G459 Transient cerebral ischemic attack, unspecified: Secondary | ICD-10-CM | POA: Diagnosis not present

## 2019-12-19 DIAGNOSIS — Z79899 Other long term (current) drug therapy: Secondary | ICD-10-CM | POA: Diagnosis not present

## 2019-12-19 DIAGNOSIS — Z79890 Hormone replacement therapy (postmenopausal): Secondary | ICD-10-CM | POA: Diagnosis not present

## 2019-12-19 MED ORDER — SODIUM CHLORIDE 0.9% FLUSH: 3.0000 mL | Freq: Once | INTRAVENOUS | Status: DC

## 2019-12-19 NOTE — ED Triage Notes (Signed)
Pt arrives to ED w/ c/o dizziness. Pt states she has hx of stroke, with the same symptoms. Pt AOx4. Neuro intact.

## 2019-12-19 NOTE — ED Provider Notes (Signed)
Asked to see the patient in triage to determine whether or not this would be a code stroke or not.  Patient reports a history of 2 vertebral artery dissections in the last 3 years.  She states that tonight at 2200 she was walking down the hall and had acute onset of severe dizziness and episode of vomiting.  No neck pain or headache during that time.  No paresthesias or weakness during that time however on the way here she was leaning on her right side and she has paresthesias in her right hand now that has not improved.  She states that in one of her dissections she had similar symptoms but were much much worse no other dissection she had no symptoms.  On examination she has good hand strength, arm strength, shoulder shrug, eyebrow raise, extraocular movements and pupils equal round reactive to light.  She is able to smile, stick out tongue midline uvula raises symmetrically.  She has intact bicep and patellar reflexes and she has symmetric strength with hip abduction and abduction along with symmetric plantar flexion dorsiflexion strength and sensation to light touch in distal extremities. At this time I think the likelihood of an embolic stroke is low at the very likely that she could have a repeat dissection.  Will discuss with neurology but will hold on calling code stroke right now.  Neurology recommends activating code stroke.    Marily Memos, MD 12/19/19 [phone]

## 2019-12-20 ENCOUNTER — Emergency Department (HOSPITAL_COMMUNITY): Payer: BC Managed Care – PPO

## 2019-12-20 ENCOUNTER — Encounter (HOSPITAL_COMMUNITY): Payer: Self-pay | Admitting: Internal Medicine

## 2019-12-20 ENCOUNTER — Observation Stay (HOSPITAL_BASED_OUTPATIENT_CLINIC_OR_DEPARTMENT_OTHER): Payer: BC Managed Care – PPO

## 2019-12-20 ENCOUNTER — Observation Stay (HOSPITAL_COMMUNITY): Payer: BC Managed Care – PPO

## 2019-12-20 DIAGNOSIS — R42 Dizziness and giddiness: Secondary | ICD-10-CM | POA: Diagnosis not present

## 2019-12-20 DIAGNOSIS — E039 Hypothyroidism, unspecified: Secondary | ICD-10-CM | POA: Diagnosis not present

## 2019-12-20 DIAGNOSIS — J4520 Mild intermittent asthma, uncomplicated: Secondary | ICD-10-CM | POA: Diagnosis present

## 2019-12-20 DIAGNOSIS — G459 Transient cerebral ischemic attack, unspecified: Secondary | ICD-10-CM

## 2019-12-20 DIAGNOSIS — R29818 Other symptoms and signs involving the nervous system: Secondary | ICD-10-CM | POA: Diagnosis not present

## 2019-12-20 DIAGNOSIS — M4802 Spinal stenosis, cervical region: Secondary | ICD-10-CM | POA: Diagnosis not present

## 2019-12-20 DIAGNOSIS — I6502 Occlusion and stenosis of left vertebral artery: Secondary | ICD-10-CM

## 2019-12-20 DIAGNOSIS — I6310 Cerebral infarction due to embolism of unspecified precerebral artery: Secondary | ICD-10-CM | POA: Diagnosis not present

## 2019-12-20 DIAGNOSIS — G43109 Migraine with aura, not intractable, without status migrainosus: Secondary | ICD-10-CM

## 2019-12-20 DIAGNOSIS — G43909 Migraine, unspecified, not intractable, without status migrainosus: Secondary | ICD-10-CM | POA: Diagnosis not present

## 2019-12-20 DIAGNOSIS — M50223 Other cervical disc displacement at C6-C7 level: Secondary | ICD-10-CM | POA: Diagnosis not present

## 2019-12-20 DIAGNOSIS — I7774 Dissection of vertebral artery: Secondary | ICD-10-CM

## 2019-12-20 LAB — CBG MONITORING, ED: Glucose-Capillary: 111 mg/dL — ABNORMAL HIGH (ref 70–99)

## 2019-12-20 LAB — DIFFERENTIAL
Abs Immature Granulocytes: 0.02 10*3/uL (ref 0.00–0.07)
Basophils Absolute: 0.1 10*3/uL (ref 0.0–0.1)
Basophils Relative: 1 %
Eosinophils Absolute: 0.1 10*3/uL (ref 0.0–0.5)
Eosinophils Relative: 1 %
Immature Granulocytes: 0 %
Lymphocytes Relative: 37 %
Lymphs Abs: 3.4 10*3/uL (ref 0.7–4.0)
Monocytes Absolute: 0.7 10*3/uL (ref 0.1–1.0)
Monocytes Relative: 8 %
Neutro Abs: 5 10*3/uL (ref 1.7–7.7)
Neutrophils Relative %: 53 %

## 2019-12-20 LAB — LIPID PANEL
Cholesterol: 143 mg/dL (ref 0–200)
HDL: 42 mg/dL (ref >40)
LDL Cholesterol: 78 mg/dL (ref 0–99)
Total CHOL/HDL Ratio: 3.4 RATIO
Triglycerides: 115 mg/dL (ref <150)
VLDL: 23 mg/dL (ref 0–40)

## 2019-12-20 LAB — COMPREHENSIVE METABOLIC PANEL
ALT: 19 U/L (ref 0–44)
ALT: 22 U/L (ref 0–44)
AST: 26 U/L (ref 15–41)
AST: 31 U/L (ref 15–41)
Albumin: 3.4 g/dL — ABNORMAL LOW (ref 3.5–5.0)
Albumin: 4.1 g/dL (ref 3.5–5.0)
Alkaline Phosphatase: 60 U/L (ref 38–126)
Alkaline Phosphatase: 75 U/L (ref 38–126)
Anion gap: 10 (ref 5–15)
Anion gap: 6 (ref 5–15)
BUN: 15 mg/dL (ref 6–20)
BUN: 23 mg/dL — ABNORMAL HIGH (ref 6–20)
CO2: 22 mmol/L (ref 22–32)
CO2: 27 mmol/L (ref 22–32)
Calcium: 7.7 mg/dL — ABNORMAL LOW (ref 8.9–10.3)
Calcium: 9.2 mg/dL (ref 8.9–10.3)
Chloride: 101 mmol/L (ref 98–111)
Chloride: 111 mmol/L (ref 98–111)
Creatinine, Ser: 0.77 mg/dL (ref 0.44–1.00)
Creatinine, Ser: 0.95 mg/dL (ref 0.44–1.00)
GFR calc Af Amer: >60 mL/min (ref >60)
GFR calc Af Amer: >60 mL/min (ref >60)
GFR calc non Af Amer: >60 mL/min (ref >60)
GFR calc non Af Amer: >60 mL/min (ref >60)
Glucose, Bld: 101 mg/dL — ABNORMAL HIGH (ref 70–99)
Glucose, Bld: 158 mg/dL — ABNORMAL HIGH (ref 70–99)
Potassium: 3.7 mmol/L (ref 3.5–5.1)
Potassium: 4.1 mmol/L (ref 3.5–5.1)
Sodium: 138 mmol/L (ref 135–145)
Sodium: 139 mmol/L (ref 135–145)
Total Bilirubin: 0.4 mg/dL (ref 0.3–1.2)
Total Bilirubin: 0.7 mg/dL (ref 0.3–1.2)
Total Protein: 5.8 g/dL — ABNORMAL LOW (ref 6.5–8.1)
Total Protein: 6.7 g/dL (ref 6.5–8.1)

## 2019-12-20 LAB — CBC
HCT: 37.2 % (ref 36.0–46.0)
HCT: 42.1 % (ref 36.0–46.0)
Hemoglobin: 12.2 g/dL (ref 12.0–15.0)
Hemoglobin: 14.3 g/dL (ref 12.0–15.0)
MCH: 31.2 pg (ref 26.0–34.0)
MCH: 32.3 pg (ref 26.0–34.0)
MCHC: 32.8 g/dL (ref 30.0–36.0)
MCHC: 34 g/dL (ref 30.0–36.0)
MCV: 95 fL (ref 80.0–100.0)
MCV: 95.1 fL (ref 80.0–100.0)
Platelets: 185 10*3/uL (ref 150–400)
Platelets: 220 10*3/uL (ref 150–400)
RBC: 3.91 MIL/uL (ref 3.87–5.11)
RBC: 4.43 MIL/uL (ref 3.87–5.11)
RDW: 11.9 % (ref 11.5–15.5)
RDW: 11.9 % (ref 11.5–15.5)
WBC: 6.6 10*3/uL (ref 4.0–10.5)
WBC: 9.3 10*3/uL (ref 4.0–10.5)
nRBC: 0 % (ref 0.0–0.2)
nRBC: 0 % (ref 0.0–0.2)

## 2019-12-20 LAB — URINALYSIS, ROUTINE W REFLEX MICROSCOPIC
Bilirubin Urine: NEGATIVE
Glucose, UA: NEGATIVE mg/dL
Hgb urine dipstick: NEGATIVE
Ketones, ur: NEGATIVE mg/dL
Leukocytes,Ua: NEGATIVE
Nitrite: NEGATIVE
Protein, ur: NEGATIVE mg/dL
Specific Gravity, Urine: 1.018 (ref 1.005–1.030)
pH: 8 (ref 5.0–8.0)

## 2019-12-20 LAB — I-STAT CHEM 8, ED
BUN: 24 mg/dL — ABNORMAL HIGH (ref 6–20)
Calcium, Ion: 1.22 mmol/L (ref 1.15–1.40)
Chloride: 102 mmol/L (ref 98–111)
Creatinine, Ser: 0.9 mg/dL (ref 0.44–1.00)
Glucose, Bld: 154 mg/dL — ABNORMAL HIGH (ref 70–99)
HCT: 44 % (ref 36.0–46.0)
Hemoglobin: 15 g/dL (ref 12.0–15.0)
Potassium: 3.8 mmol/L (ref 3.5–5.1)
Sodium: 139 mmol/L (ref 135–145)
TCO2: 27 mmol/L (ref 22–32)

## 2019-12-20 LAB — I-STAT BETA HCG BLOOD, ED (MC, WL, AP ONLY): I-stat hCG, quantitative: <5 m[IU]/mL (ref <5)

## 2019-12-20 LAB — RAPID URINE DRUG SCREEN, HOSP PERFORMED
Amphetamines: NOT DETECTED
Barbiturates: NOT DETECTED
Benzodiazepines: NOT DETECTED
Cocaine: NOT DETECTED
Opiates: NOT DETECTED
Tetrahydrocannabinol: NOT DETECTED

## 2019-12-20 LAB — APTT: aPTT: 26 seconds (ref 24–36)

## 2019-12-20 LAB — ETHANOL: Alcohol, Ethyl (B): <10 mg/dL (ref <10)

## 2019-12-20 LAB — HEMOGLOBIN A1C
Hgb A1c MFr Bld: 5.6 % (ref 4.8–5.6)
Mean Plasma Glucose: 114.02 mg/dL

## 2019-12-20 LAB — PROTIME-INR
INR: 1 (ref 0.8–1.2)
Prothrombin Time: 12.5 seconds (ref 11.4–15.2)

## 2019-12-20 LAB — TSH: TSH: 2.236 u[IU]/mL (ref 0.350–4.500)

## 2019-12-20 LAB — HIV ANTIBODY (ROUTINE TESTING W REFLEX): HIV Screen 4th Generation wRfx: NONREACTIVE

## 2019-12-20 LAB — TROPONIN I (HIGH SENSITIVITY): Troponin I (High Sensitivity): 3 ng/L (ref <18)

## 2019-12-20 LAB — ECHOCARDIOGRAM COMPLETE

## 2019-12-20 MED ORDER — ACETAMINOPHEN 160 MG/5ML PO SOLN: 650.0000 mg | ORAL | Status: DC | PRN

## 2019-12-20 MED ORDER — STROKE: EARLY STAGES OF RECOVERY BOOK: Freq: Once | Status: DC

## 2019-12-20 MED ORDER — SODIUM CHLORIDE 0.9 % IV SOLN: INTRAVENOUS | Status: DC

## 2019-12-20 MED ORDER — ASPIRIN EC 81 MG PO TBEC
81.0000 mg | DELAYED_RELEASE_TABLET | Freq: Every day | ORAL | Status: DC
  Administered 2019-12-20: 81 mg via ORAL
  Filled 2019-12-20 (×2): qty 1

## 2019-12-20 MED ORDER — ZONISAMIDE 25 MG PO CAPS
150.0000 mg | ORAL_CAPSULE | Freq: Every day | ORAL | Status: DC
  Administered 2019-12-20: 150 mg via ORAL
  Filled 2019-12-20: qty 6

## 2019-12-20 MED ORDER — AZELASTINE HCL 0.1 % NA SOLN
1.0000 | Freq: Every day | NASAL | Status: DC
  Administered 2019-12-20: 1 via NASAL
  Filled 2019-12-20: qty 30

## 2019-12-20 MED ORDER — ALBUTEROL SULFATE (2.5 MG/3ML) 0.083% IN NEBU: 2.5000 mg | INHALATION_SOLUTION | RESPIRATORY_TRACT | Status: DC | PRN

## 2019-12-20 MED ORDER — ATORVASTATIN CALCIUM 40 MG PO TABS: 40.0000 mg | ORAL_TABLET | Freq: Every day | ORAL | 0 refills | Status: DC

## 2019-12-20 MED ORDER — ACETAMINOPHEN 325 MG PO TABS: 650.0000 mg | ORAL_TABLET | ORAL | Status: DC | PRN

## 2019-12-20 MED ORDER — ONDANSETRON HCL 4 MG/2ML IJ SOLN: 4.0000 mg | Freq: Four times a day (QID) | INTRAMUSCULAR | Status: DC | PRN

## 2019-12-20 MED ORDER — ENOXAPARIN SODIUM 40 MG/0.4ML ~~LOC~~ SOLN
40.0000 mg | SUBCUTANEOUS | Status: DC
  Administered 2019-12-20: 40 mg via SUBCUTANEOUS
  Filled 2019-12-20: qty 0.4

## 2019-12-20 MED ORDER — LACTATED RINGERS IV BOLUS
1000.0000 mL | Freq: Once | INTRAVENOUS | Status: AC
  Administered 2019-12-20: 1000 mL via INTRAVENOUS

## 2019-12-20 MED ORDER — CLOPIDOGREL BISULFATE 75 MG PO TABS: 75.0000 mg | ORAL_TABLET | Freq: Every day | ORAL | 0 refills | Status: DC

## 2019-12-20 MED ORDER — VERAPAMIL HCL ER 120 MG PO TBCR: 120.0000 mg | EXTENDED_RELEASE_TABLET | Freq: Every day | ORAL | 0 refills | Status: DC

## 2019-12-20 MED ORDER — ASPIRIN 81 MG PO TBEC: 81.0000 mg | DELAYED_RELEASE_TABLET | Freq: Every day | ORAL | 0 refills | Status: DC

## 2019-12-20 MED ORDER — CLOPIDOGREL BISULFATE 75 MG PO TABS
75.0000 mg | ORAL_TABLET | Freq: Every day | ORAL | Status: DC
  Administered 2019-12-20: 75 mg via ORAL
  Filled 2019-12-20: qty 1

## 2019-12-20 MED ORDER — ATORVASTATIN CALCIUM 40 MG PO TABS
40.0000 mg | ORAL_TABLET | Freq: Every day | ORAL | Status: DC
  Administered 2019-12-20: 40 mg via ORAL
  Filled 2019-12-20: qty 1

## 2019-12-20 MED ORDER — MECLIZINE HCL 25 MG PO TABS
25.0000 mg | ORAL_TABLET | Freq: Once | ORAL | Status: AC
  Administered 2019-12-20: 25 mg via ORAL
  Filled 2019-12-20: qty 1

## 2019-12-20 MED ORDER — ACETAMINOPHEN 650 MG RE SUPP: 650.0000 mg | RECTAL | Status: DC | PRN

## 2019-12-20 MED ORDER — POLYETHYLENE GLYCOL 3350 17 G PO PACK: 17.0000 g | PACK | Freq: Every day | ORAL | Status: DC | PRN

## 2019-12-20 MED ORDER — VERAPAMIL HCL ER 120 MG PO TBCR: 120.0000 mg | EXTENDED_RELEASE_TABLET | Freq: Every day | ORAL | Status: DC

## 2019-12-20 MED ORDER — ATORVASTATIN CALCIUM 80 MG PO TABS: 80.0000 mg | ORAL_TABLET | Freq: Every day | ORAL | Status: DC

## 2019-12-20 MED ORDER — IOHEXOL 350 MG/ML SOLN
75.0000 mL | Freq: Once | INTRAVENOUS | Status: AC | PRN
  Administered 2019-12-20: 75 mL via INTRAVENOUS

## 2019-12-20 NOTE — ED Notes (Signed)
Will hold on swabbing for SARCoronaVirus per Attending

## 2019-12-20 NOTE — Progress Notes (Signed)
  Echocardiogram 2D Echocardiogram has been performed.  Teresa Guzman F 12/20/2019, 12:01 PM

## 2019-12-20 NOTE — Evaluation (Signed)
Occupational Therapy Evaluation Patient Details Name: Teresa Guzman MRN: [id number] DOB: Apr 17, 1981 Today's Date: 12/20/2019    History of Present Illness Pt is a 39 y/o female admitted secondary to gait instability, dizziness and vomitting. MRI of the brain was negative for any acute findings. PMH including but not limited to asthma, migraine headaches and right cerebellar stroke in 2017 secondary to right vertebral artery dissection as well as left vertebral artery occlusion identified in late 2019.   Clinical Impression   PTA pt living independently with spouse and children, living an active lifestyle. Pt does have CVA hx but no residual deficits at this time. At time of eval, pt completed mobility and BADL at mod I level. Pt completed beyond household distance with dynamic balance challenges without LOB. No UE weakness noted. Vision is at baseline other than mild dizziness with superior and inferior head movement. No further OT needs are identified, OT will sign off. Thank you for this consult.     Follow Up Recommendations  No OT follow up    Equipment Recommendations  None recommended by OT    Recommendations for Other Services       Precautions / Restrictions Precautions Precautions: Fall Restrictions Weight Bearing Restrictions: No      Mobility Bed Mobility Overal bed mobility: Modified Independent                Transfers Overall transfer level: Modified independent Equipment used: None                  Balance Overall balance assessment: Independent                               Standardized Balance Assessment Standardized Balance Assessment : Dynamic Gait Index   Dynamic Gait Index Level Surface: Normal Change in Gait Speed: Normal Gait with Horizontal Head Turns: Normal Gait with Vertical Head Turns: Mild Impairment Gait and Pivot Turn: Normal Step Over Obstacle: Normal Step Around Obstacles: Normal Steps: Normal Total Score:  23     ADL either performed or assessed with clinical judgement   ADL Overall ADL's : Modified independent                                       General ADL Comments: Pt completing BADL without external assist at this time. Does get slightly dizzy with head turns but is able to safely compensate     Vision Baseline Vision/History: No visual deficits Patient Visual Report: Nausea/blurring vision with head movement Vision Assessment?: No apparent visual deficits Additional Comments: no visual deficits notes with assessment, slight dizziness with head turns     Perception     Praxis      Pertinent Vitals/Pain Pain Assessment: No/denies pain     Hand Dominance Right   Extremity/Trunk Assessment Upper Extremity Assessment Upper Extremity Assessment: Overall WFL for tasks assessed   Lower Extremity Assessment Lower Extremity Assessment: Overall WFL for tasks assessed   Cervical / Trunk Assessment Cervical / Trunk Assessment: Normal   Communication Communication Communication: No difficulties   Cognition Arousal/Alertness: Awake/alert Behavior During Therapy: WFL for tasks assessed/performed Overall Cognitive Status: Within Functional Limits for tasks assessed  General Comments       Exercises     Shoulder Instructions      Home Living Family/patient expects to be discharged to:: Private residence Living Arrangements: Spouse/significant other;Children Available Help at Discharge: Family;Available 24 hours/day Type of Home: House Home Access: Stairs to enter [employer] of Steps: 3 Entrance Stairs-Rails: None Home Layout: One level     Bathroom Shower/Tub: Producer, television/film/video: Standard Bathroom Accessibility: No   Home Equipment: None          Prior Functioning/Environment Level of Independence: Independent        Comments: does crossfit, active lifestyle  as a mom        OT Problem List: Decreased knowledge of use of DME or AE;Decreased knowledge of precautions;Impaired balance (sitting and/or standing)      OT Treatment/Interventions:      OT Goals(Current goals can be found in the care plan section) Acute Rehab OT Goals Patient Stated Goal: "home today" OT Goal Formulation: All assessment and education complete, DC therapy  OT Frequency:     Barriers to D/C:            Co-evaluation PT/OT/SLP Co-Evaluation/Treatment: Yes Reason for Co-Treatment: To address functional/ADL transfers PT goals addressed during session: Mobility/safety with mobility;Balance;Proper use of DME;Strengthening/ROM OT goals addressed during session: ADL's and self-care      AM-PAC OT "6 Clicks" Daily Activity     Outcome Measure Help from another person eating meals?: None Help from another person taking care of personal grooming?: None Help from another person toileting, which includes using toliet, bedpan, or urinal?: None Help from another person bathing (including washing, rinsing, drying)?: None Help from another person to put on and taking off regular upper body clothing?: None Help from another person to put on and taking off regular lower body clothing?: None 6 Click Score: 24   End of Session Equipment Utilized During Treatment: Gait belt Nurse Communication: Mobility status  Activity Tolerance: Patient tolerated treatment well Patient left: in bed;with call bell/phone within reach  OT Visit Diagnosis: Other symptoms and signs involving the nervous system (R29.898)                Time: 1047-1106 OT Time Calculation (min): 19 min Charges:  OT General Charges $OT Visit: 1 Visit OT Evaluation $OT Eval Low Complexity: 1 Low  Dalphine Handing, MSOT, OTR/L Acute Rehabilitation Services [facility] Office Number: [phone] Pager: [phone]  Dalphine Handing 12/20/2019, 2:46 PM

## 2019-12-20 NOTE — Consult Note (Signed)
Requesting Physician: Dr. Erin Hearing    Chief Complaint: Acute vertigo  History obtained from: Patient and Chart   HPI:                                                                                                                                       Teresa Guzman Code is a 39 y.o. female with past medical history of right cerebellar stroke and history of spontaneous bilateral vertebral dissections, migraines presents to the emergency department sudden onset dizziness and vomiting associated with gait imbalance at 10 PM.  She presented to the emergency room with above symptoms-evaluated by EDP after discussion code stroke activated.  Stat CT head was obtained which showed no acute findings and CT angiogram showed stable left vertebral V1 segment occlusion.  Right vertebral artery is open.  NIH stroke scale was 0, patient had mild gait difficulty but was able to walk without support.  tPA not administered due to mild and nondisabling symptoms.  Patient within the window till 2.30 AM.    Date last known well: 12/20/2019 Time last known well: 10 PM tPA Given: No, mild nondisabling symptoms NIHSS: 0 Baseline MRS 0    Past Medical History:  Diagnosis Date  . Anemia   . Asthma    childhood asthma - no inhaler, no problems as adult  . Bipolar disorder (HCC)    dx while in college, no current meds  . Depression    no current meds  . Endometriosis   . Headache    HX  MIGRAINES During menstrual cycle  . Hypothyroidism childhood  . Infertility, female   . Seasonal allergies   . Stroke (HCC) 09/09/15   hadright cerebellar infarct secondary to right vertebral artery dissection-no deficits, no treatment, no problems since 09/2015  . Vertebral artery dissection (HCC) 2017    Past Surgical History:  Procedure Laterality Date  . ANTERIOR CERVICAL DECOMP/DISCECTOMY FUSION N/A 03/08/2017   Procedure: ANTERIOR CERVICAL DECOMPRESSION/DISCECTOMY FUSION C5-6;  Surgeon: Venita Lick, MD;  Location: [facility] OR;   Service: Orthopedics;  Laterality: N/A;  3 hours  . arm surgery Left 1987  . CHOLECYSTECTOMY  10/2017   [place].  . COLONOSCOPY  06/2017   at [facility]  . CYST REMOVAL HAND     CYST REMOVED OFF OVARY - Not Hand  . CYSTOSCOPY WITH STENT PLACEMENT Bilateral 11/14/2018   Procedure: CYSTOSCOPY WITH STENT PLACEMENT;  Surgeon: Bjorn Pippin, MD;  Location: [facility] ORS;  Service: Urology;  Laterality: Bilateral;  . DILITATION & CURRETTAGE/HYSTROSCOPY WITH NOVASURE ABLATION N/A 12/13/2017   Procedure: DILATATION & CURETTAGE/HYSTEROSCOPY WITH HYDROTHERMAL   ABLATION;  Surgeon: Shea Evans, MD;  Location: [facility] ORS;  Service: Gynecology;  Laterality: N/A;  . LAPAROSCOPIC OVARIAN CYSTECTOMY Bilateral 12/13/2017   Procedure: LAPAROSCOPY WITH RIGHT OVARIAN CYSTECTOMY, ENDOMETRIOSIS ABLATION, AND EXCISION OF LEFT URETERAL IMPLANT;  Surgeon: Shea Evans, MD;  Location: [facility] ORS;  Service: Gynecology;  Laterality: Bilateral;  . OVARIAN CYST REMOVAL  2011   [place] abdominal excision  . ROBOTIC ASSISTED LAPAROSCOPIC HYSTERECTOMY AND SALPINGECTOMY Bilateral 11/14/2018   Procedure: XI ROBOTIC ASSISTED LAPAROSCOPIC HYSTERECTOMY AND BILATERAL SALPINGO-OOPHORECTOMY;  Surgeon: Shea EvansMody, Vaishali, MD;  Location: [facility] ORS;  Service: Gynecology;  Laterality: Bilateral;  . WISDOM TOOTH EXTRACTION      Family History  Problem Relation Age of Onset  . Colon cancer Brother 33       chemo, sx   . Hyperlipidemia Mother   . Hypertension Father   . Hyperlipidemia Father   . Heart attack Father   . Diabetes Father    Social History:  reports that she has never smoked. She has never used smokeless tobacco. She reports that she does not drink alcohol or use drugs.  Allergies:  Allergies  Allergen Reactions  . Morphine And Related Other (See Comments)    Headache for a long time per pt  . Hydrocortisone Rash  . Neosporin [Neomycin-Bacitracin Zn-Polymyx] Rash    Medications:                                                                                                                         I reviewed home medications   ROS:                                                                                                                                     14 systems reviewed and negative except above    Examination:                                                                                                      General: Appears well-developed  Psych: Affect appropriate to situation Eyes: No scleral injection HENT: No OP obstrucion Head: Normocephalic.  Cardiovascular: Normal rate and regular rhythm. Respiratory: Effort normal and breath sounds normal to anterior ascultation GI: Soft.  No distension. There is no tenderness.  Skin: WDI    Neurological Examination  Mental Status: Alert, oriented, thought content appropriate.  Speech fluent without evidence of aphasia. Able to follow 3 step commands without difficulty. Cranial Nerves: II: Visual fields grossly normal,  III,IV, VI: ptosis not present, extra-ocular motions intact bilaterally, pupils equal, round, reactive to light and accommodation V,VII: smile symmetric, facial light touch sensation normal bilaterally VIII: hearing normal bilaterally IX,X: uvula rises symmetrically XI: bilateral shoulder shrug XII: midline tongue extension Motor: Right : Upper extremity   5/5    Left:     Upper extremity   5/5  Lower extremity   5/5     Lower extremity   5/5 Tone and bulk:normal tone throughout; no atrophy noted Sensory: Pinprick and light touch intact throughout, bilaterally Deep Tendon Reflexes: 2+ and symmetric throughout Plantars: Right: downgoing   Left: downgoing Cerebellar: normal finger-to-nose, normal rapid alternating movements and normal heel-to-shin test Gait: Mild gait imbalance     Lab Results: Basic Metabolic Panel: Recent Labs  Lab 12/19/19 2355 12/20/19 0007  NA 138 139  K 4.1 3.8  CL 101 102  CO2 27  --   GLUCOSE 158*  154*  BUN 23* 24*  CREATININE 0.95 0.90  CALCIUM 9.2  --     CBC: Recent Labs  Lab 12/19/19 2355 12/20/19 0007  WBC 9.3  --   NEUTROABS 5.0  --   HGB 14.3 15.0  HCT 42.1 44.0  MCV 95.0  --   PLT 220  --     Coagulation Studies: Recent Labs    12/19/19 2355  LABPROT 12.5  INR 1.0    Imaging: CT Angio Head W or Wo Contrast  Result Date: 12/20/2019 CLINICAL DATA:  Vertigo EXAM: CT ANGIOGRAPHY HEAD AND NECK TECHNIQUE: Multidetector CT imaging of the head and neck was performed using the standard protocol during bolus administration of intravenous contrast. Multiplanar CT image reconstructions and MIPs were obtained to evaluate the vascular anatomy. Carotid stenosis measurements (when applicable) are obtained utilizing NASCET criteria, using the distal internal carotid diameter as the denominator. CONTRAST:  74mL OMNIPAQUE IOHEXOL 350 MG/ML SOLN COMPARISON:  01/14/2019 FINDINGS: CTA NECK FINDINGS SKELETON: There is no bony spinal canal stenosis. No lytic or blastic lesion. OTHER NECK: Normal pharynx, larynx and major salivary glands. No cervical lymphadenopathy. Unremarkable thyroid gland. UPPER CHEST: No pneumothorax or pleural effusion. No nodules or masses. AORTIC ARCH: There is no calcific atherosclerosis of the aortic arch. There is no aneurysm, dissection or hemodynamically significant stenosis of the visualized portion of the aorta. Normal variant aortic arch branching pattern with the left vertebral artery arising independently from the aortic arch. The visualized proximal subclavian arteries are widely patent. RIGHT CAROTID SYSTEM: Normal without aneurysm, dissection or stenosis. LEFT CAROTID SYSTEM: Normal without aneurysm, dissection or stenosis. VERTEBRAL ARTERIES: Right dominant configuration. Left V1 segment remains occluded just beyond its origin. V2 segment is reconstituted at the C5 level. The remainder of the vessel is patent the vertebrobasilar confluence. There is no  dissection, occlusion or flow-limiting stenosis to the skull base (V1-V3 segments). CTA HEAD FINDINGS POSTERIOR CIRCULATION: --Vertebral arteries: Normal V4 segments. --Inferior cerebellar arteries: Normal. --Basilar artery: Normal. --Superior cerebellar arteries: Normal. --Posterior cerebral arteries (PCA): Normal. There are bilateral posterior communicating arteries (p-comm) that partially supply the PCAs. ANTERIOR CIRCULATION: --Intracranial internal carotid arteries: Normal. --Anterior cerebral arteries (ACA): Normal. Both A1 segments are present. Patent anterior communicating artery (a-comm). --Middle cerebral arteries (MCA): Normal. VENOUS SINUSES: As permitted by contrast timing, patent. ANATOMIC VARIANTS: None Review of the MIP images confirms  the above findings. IMPRESSION: 1. No intracranial arterial occlusion or high-grade stenosis. 2. Chronic occlusion of the left vertebral artery V1 segment, with reconstitution at the C5 level. Electronically Signed   By: Deatra Robinson M.D.   On: 12/20/2019 00:38   CT Angio Neck W and/or Wo Contrast  Result Date: 12/20/2019 CLINICAL DATA:  Vertigo EXAM: CT ANGIOGRAPHY HEAD AND NECK TECHNIQUE: Multidetector CT imaging of the head and neck was performed using the standard protocol during bolus administration of intravenous contrast. Multiplanar CT image reconstructions and MIPs were obtained to evaluate the vascular anatomy. Carotid stenosis measurements (when applicable) are obtained utilizing NASCET criteria, using the distal internal carotid diameter as the denominator. CONTRAST:  2mL OMNIPAQUE IOHEXOL 350 MG/ML SOLN COMPARISON:  01/14/2019 FINDINGS: CTA NECK FINDINGS SKELETON: There is no bony spinal canal stenosis. No lytic or blastic lesion. OTHER NECK: Normal pharynx, larynx and major salivary glands. No cervical lymphadenopathy. Unremarkable thyroid gland. UPPER CHEST: No pneumothorax or pleural effusion. No nodules or masses. AORTIC ARCH: There is no  calcific atherosclerosis of the aortic arch. There is no aneurysm, dissection or hemodynamically significant stenosis of the visualized portion of the aorta. Normal variant aortic arch branching pattern with the left vertebral artery arising independently from the aortic arch. The visualized proximal subclavian arteries are widely patent. RIGHT CAROTID SYSTEM: Normal without aneurysm, dissection or stenosis. LEFT CAROTID SYSTEM: Normal without aneurysm, dissection or stenosis. VERTEBRAL ARTERIES: Right dominant configuration. Left V1 segment remains occluded just beyond its origin. V2 segment is reconstituted at the C5 level. The remainder of the vessel is patent the vertebrobasilar confluence. There is no dissection, occlusion or flow-limiting stenosis to the skull base (V1-V3 segments). CTA HEAD FINDINGS POSTERIOR CIRCULATION: --Vertebral arteries: Normal V4 segments. --Inferior cerebellar arteries: Normal. --Basilar artery: Normal. --Superior cerebellar arteries: Normal. --Posterior cerebral arteries (PCA): Normal. There are bilateral posterior communicating arteries (p-comm) that partially supply the PCAs. ANTERIOR CIRCULATION: --Intracranial internal carotid arteries: Normal. --Anterior cerebral arteries (ACA): Normal. Both A1 segments are present. Patent anterior communicating artery (a-comm). --Middle cerebral arteries (MCA): Normal. VENOUS SINUSES: As permitted by contrast timing, patent. ANATOMIC VARIANTS: None Review of the MIP images confirms the above findings. IMPRESSION: 1. No intracranial arterial occlusion or high-grade stenosis. 2. Chronic occlusion of the left vertebral artery V1 segment, with reconstitution at the C5 level. Electronically Signed   By: Deatra Robinson M.D.   On: 12/20/2019 00:38   CT HEAD CODE STROKE WO CONTRAST  Result Date: 12/20/2019 CLINICAL DATA:  Code stroke.  Dizziness EXAM: CT HEAD WITHOUT CONTRAST TECHNIQUE: Contiguous axial images were obtained from the base of the  skull through the vertex without intravenous contrast. COMPARISON:  None. FINDINGS: Brain: There is no mass, hemorrhage or extra-axial collection. The size and configuration of the ventricles and extra-axial CSF spaces are normal. The brain parenchyma is normal, without evidence of acute or chronic infarction. Vascular: No abnormal hyperdensity of the major intracranial arteries or dural venous sinuses. No intracranial atherosclerosis. Skull: The visualized skull base, calvarium and extracranial soft tissues are normal. Sinuses/Orbits: No fluid levels or advanced mucosal thickening of the visualized paranasal sinuses. No mastoid or middle ear effusion. The orbits are normal. ASPECTS [facility] Stroke Program Early CT Score) - Ganglionic level infarction (caudate, lentiform nuclei, internal capsule, insula, M1-M3 cortex): 7 - Supraganglionic infarction (M4-M6 cortex): 3 Total score (0-10 with 10 being normal): 10 IMPRESSION: 1. Normal head CT. 2. ASPECTS is 10. * These results were communicated to Dr. Georgiana Spinner Pauline Trainer at 12:23 am  on 12/20/2019 by text page via the [facility] messaging system. Electronically Signed   By: Deatra Robinson M.D.   On: 12/20/2019 00:23     ASSESSMENT AND PLAN   Acute onset vertigo concerning for posterior circulation infarct/TIA  Recommend # MRI of the brain without contrast #Transthoracic Echo  #Continue aspirin, can consider dual antiplatelet therapy if patient does have stroke #Start or continue Atorvastatin 40 mg/other high intensity statin # BP goal: permissive HTN upto 220/120 mmHg # HBAIC and Lipid profile # Telemetry monitoring # Frequent neuro checks #  stroke swallow screen  Please page stroke NP  Or  PA  Or MD from 8am -4 pm  as this patient from this time will be  followed by the stroke.   You can look them up on [email or web address]  Password [facility]   Devlynn Knoff [facility] Pager Number [phone]

## 2019-12-20 NOTE — ED Provider Notes (Signed)
Emergency Department Provider Note  I have reviewed the triage vital signs and the nursing notes.  HISTORY  Chief Complaint Dizziness   HPI Teresa Guzman is a 39 y.o. female with a history of 2 vertebral artery dissections in the last 3 years.  She states that tonight at 2200 she was walking down the hall and had acute onset of severe dizziness and episode of vomiting.  No neck pain or headache during that time.  has blurry vision. No paresthesias or weakness during that time however on the way here she was leaning on her right side and she has paresthesias in her right hand now that has not improved.  She states that in one of her dissections she had similar symptoms but were much much worse and in her other dissection she had no symptoms.     No other associated or modifying symptoms.    Past Medical History:  Diagnosis Date  . Anemia   . Asthma    childhood asthma - no inhaler, no problems as adult  . Bipolar disorder (HCC)    dx while in college, no current meds  . Depression    no current meds  . Endometriosis   . Headache    HX  MIGRAINES During menstrual cycle  . Hypothyroidism childhood  . Infertility, female   . Seasonal allergies   . Stroke (HCC) 09/09/15   hadright cerebellar infarct secondary to right vertebral artery dissection-no deficits, no treatment, no problems since 09/2015  . Vertebral artery dissection [facility]) 2017    Patient Active Problem List   Diagnosis Date Noted  . Exertional headache 01/01/2019  . History of cerebellar stroke 01/01/2019  . Migraine without aura and without status migrainosus, not intractable 01/01/2019  . S/P total hysterectomy and bilateral salpingo-oophorectomy 11/14/2018  . Status post cervical spinal fusion 03/08/2017  . Positive ANA (antinuclear antibody) 09/15/2015  . Hx of cerebral embolic infarction   . Asthma 09/09/2015  . Cough variant asthma 05/15/2013  . Physical exam 12/14/2010  . Infertility, female 12/14/2010  .  OTHER AND UNSPECIFIED OVARIAN CYST 05/10/2010  . Vitamin D deficiency 05/06/2010  . Hypothyroidism 10/02/2008  . Anemia 10/02/2008  . Migraine, menstrual 10/02/2008  . RHINITIS 10/02/2008    Past Surgical History:  Procedure Laterality Date  . ANTERIOR CERVICAL DECOMP/DISCECTOMY FUSION N/A 03/08/2017   Procedure: ANTERIOR CERVICAL DECOMPRESSION/DISCECTOMY FUSION C5-6;  Surgeon: Venita Lick, MD;  Location: [facility] OR;  Service: Orthopedics;  Laterality: N/A;  3 hours  . arm surgery Left 1987  . CHOLECYSTECTOMY  10/2017   [place].  . COLONOSCOPY  06/2017   at [facility]  . CYST REMOVAL HAND     CYST REMOVED OFF OVARY - Not Hand  . CYSTOSCOPY WITH STENT PLACEMENT Bilateral 11/14/2018   Procedure: CYSTOSCOPY WITH STENT PLACEMENT;  Surgeon: Bjorn Pippin, MD;  Location: [facility] ORS;  Service: Urology;  Laterality: Bilateral;  . DILITATION & CURRETTAGE/HYSTROSCOPY WITH NOVASURE ABLATION N/A 12/13/2017   Procedure: DILATATION & CURETTAGE/HYSTEROSCOPY WITH HYDROTHERMAL   ABLATION;  Surgeon: Shea Evans, MD;  Location: [facility] ORS;  Service: Gynecology;  Laterality: N/A;  . LAPAROSCOPIC OVARIAN CYSTECTOMY Bilateral 12/13/2017   Procedure: LAPAROSCOPY WITH RIGHT OVARIAN CYSTECTOMY, ENDOMETRIOSIS ABLATION, AND EXCISION OF LEFT URETERAL IMPLANT;  Surgeon: Shea Evans, MD;  Location: [facility] ORS;  Service: Gynecology;  Laterality: Bilateral;  . OVARIAN CYST REMOVAL  2011   [place] abdominal excision  . ROBOTIC ASSISTED LAPAROSCOPIC HYSTERECTOMY AND SALPINGECTOMY Bilateral 11/14/2018   Procedure: XI  ROBOTIC ASSISTED LAPAROSCOPIC HYSTERECTOMY AND BILATERAL SALPINGO-OOPHORECTOMY;  Surgeon: Shea Evans, MD;  Location: [facility] ORS;  Service: Gynecology;  Laterality: Bilateral;  . WISDOM TOOTH EXTRACTION      Current Outpatient Rx  . Order #: [id number] Class: OTC  . Order #: [id number] Class: Historical Med  . Order #: [id number] Class: Normal  . Order #: [id number] Class: Normal  . Order #: [id number] Class: Normal  . Order #:  [id number] Class: Normal  . Order #: [id number] Class: Normal  . Order #: [id number] Class: Historical Med  . Order #: [id number] Class: Historical Med  . Order #: [id number] Class: Historical Med  . Order #: [id number] Class: Normal  . Order #: [id number] Class: Normal  . Order #: [id number] Class: Historical Med  . Order #: [id number] Class: Historical Med  . Order #: [id number] Class: Historical Med    Allergies Morphine and related, Hydrocortisone, and Neosporin [neomycin-bacitracin zn-polymyx]  Family History  Problem Relation Age of Onset  . Colon cancer Brother 33       chemo, sx   . Hyperlipidemia Mother   . Hypertension Father   . Hyperlipidemia Father   . Heart attack Father   . Diabetes Father     Social History Social History   Tobacco Use  . Smoking status: Never Smoker  . Smokeless tobacco: Never Used  Substance Use Topics  . Alcohol use: No  . Drug use: No    Review of Systems  All other systems negative except as documented in the HPI. All pertinent positives and negatives as reviewed in the HPI. ____________________________________________  PHYSICAL EXAM:  VITAL SIGNS: ED Triage Vitals  Enc Vitals Group     BP 12/19/19 2331 137/88     Pulse Rate 12/19/19 2331 71     Resp 12/19/19 2331 15     Temp 12/19/19 2331 98.1 F (36.7 C)     Temp Source 12/19/19 2331 Oral     SpO2 12/19/19 2331 100 %    Constitutional: Alert and oriented. Well appearing and in no acute distress. Eyes: Conjunctivae are normal. PERRL. EOMI. Head: Atraumatic. Nose: No congestion/rhinnorhea. Mouth/Throat: Mucous membranes are moist.  Oropharynx non-erythematous. Neck: No stridor.  No meningeal signs.   Cardiovascular: Normal rate, regular rhythm. Good peripheral circulation. Grossly normal heart sounds.   Respiratory: Normal respiratory effort.  No retractions. Lungs CTAB. Gastrointestinal: Soft and nontender. No distention.  Musculoskeletal: No lower extremity tenderness nor edema. No  gross deformities of extremities. Neurologic:  On examination she has good hand strength, arm strength, shoulder shrug, eyebrow raise, extraocular movements and pupils equal round reactive to light.  She is able to smile, stick out tongue midline uvula raises symmetrically.  She has intact bicep and patellar reflexes and she has symmetric strength with hip abduction and abduction along with symmetric plantar flexion dorsiflexion strength and sensation to light touch in distal extremities. Skin:  Skin is warm, dry and intact. No rash noted.  ____________________________________________   LABS (all labs ordered are listed, but only abnormal results are displayed)  Labs Reviewed  COMPREHENSIVE METABOLIC PANEL - Abnormal; Notable for the following components:      Result Value   Glucose, Bld 158 (*)    BUN 23 (*)    All other components within normal limits  URINALYSIS, ROUTINE W REFLEX MICROSCOPIC - Abnormal; Notable for the following components:   Color, Urine COLORLESS (*)    All other components within normal limits  COMPREHENSIVE METABOLIC PANEL - Abnormal; Notable for the following components:   Glucose, Bld 101 (*)    Calcium  7.7 (*)    Total Protein 5.8 (*)    Albumin 3.4 (*)    All other components within normal limits  I-STAT CHEM 8, ED - Abnormal; Notable for the following components:   BUN 24 (*)    Glucose, Bld 154 (*)    All other components within normal limits  CBG MONITORING, ED - Abnormal; Notable for the following components:   Glucose-Capillary 111 (*)    All other components within normal limits  SARS CORONAVIRUS 2 BY RT PCR (HOSPITAL ORDER, PERFORMED IN [facility])  PROTIME-INR  APTT  CBC  DIFFERENTIAL  ETHANOL  RAPID URINE DRUG SCREEN, HOSP PERFORMED  LIPID PANEL  HEMOGLOBIN A1C  TSH  HIV ANTIBODY (ROUTINE TESTING W REFLEX)  CBC  I-STAT BETA HCG BLOOD, ED ([facility], [facility], [facility] ONLY)  TROPONIN I (HIGH SENSITIVITY)    ____________________________________________  EKG   EKG Interpretation  Date/Time:  Saturday Dec 20 2019 12:13:20 EDT Ventricular Rate:  58 PR Interval:    QRS Duration: 70 QT Interval:  424 QTC Calculation: 417 R Axis:   77 Text Interpretation: Sinus rhythm Consider right atrial enlargement ST elev, probable normal early repol pattern No significant change since last tracing Confirmed by Marily Memos [phone]) on 12/21/2019 3:53:22 AM       ____________________________________________  RADIOLOGY  MR CERVICAL SPINE WO CONTRAST  Result Date: 12/20/2019 CLINICAL DATA:  Cervical spinal stenosis. EXAM: MRI CERVICAL SPINE WITHOUT CONTRAST TECHNIQUE: Multiplanar, multisequence MR imaging of the cervical spine was performed. No intravenous contrast was administered. COMPARISON:  01/26/2017 FINDINGS: The axial gradient echo sequence is moderately to severely motion degraded. Alignment: Cervical spine straightening. No listhesis. Vertebrae: Interval C5-6 ACDF. No fracture, suspicious osseous lesion, or significant marrow edema. Cord: Normal signal and morphology. Posterior Fossa, vertebral arteries, paraspinal tissues: Abnormal appearance of the proximal left vertebral artery corresponding to chronic occlusion, more fully evaluated on today's earlier CTA. Posterior fossa more fully evaluated on today's earlier brain MRI. No acute paraspinal soft tissue abnormality. Disc levels: C2-3: Negative. C3-4: Left greater than right uncovertebral spurring results in new mild left neural foraminal stenosis without spinal stenosis. C4-5: Minimal disc bulging without stenosis. C5-6: ACDF. No stenosis. C6-7: A new left paracentral to left foraminal disc protrusion results in mild left neural foraminal stenosis without spinal stenosis. C7-T1: Negative. IMPRESSION: 1. Interval C5-6 ACDF without stenosis. 2. New leftward disc protrusion at C6-7 with mild left neural foraminal stenosis. 3. New mild left neural  foraminal stenosis at C3-4 due to uncovertebral spurring. Electronically Signed   By: Sebastian Ache M.D.   On: 12/20/2019 15:43   ECHOCARDIOGRAM COMPLETE  Result Date: 12/20/2019    ECHOCARDIOGRAM REPORT   Patient Name:   ANEITA KIGER Date of Exam: 12/20/2019 Medical Rec #:  [id number]   Height:       60.0 in Accession #:    [id number]  Weight:       147.5 lb Date of Birth:  [birth year]   BSA:          1.640 m Patient Age:    38 years    BP:           129/82 mmHg Patient Gender: F           HR:           61 bpm. Exam Location:  Inpatient Procedure: 2D Echo, Color Doppler and Cardiac Doppler Indications:    TIA 435.9  History:  Patient has prior history of Echocardiogram examinations, most                 recent 09/10/2015.  Sonographer:    Merrie Roof RDCS Referring Phys: [id number] [place]  1. Left ventricular ejection fraction, by estimation, is 60 to 65%. The left ventricle has normal function. The left ventricle has no regional wall motion abnormalities. Left ventricular diastolic parameters were normal.  2. Right ventricular systolic function is normal. The right ventricular size is normal. There is normal pulmonary artery systolic pressure. The estimated right ventricular systolic pressure is 70.3 mmHg.  3. The mitral valve is grossly normal. Trivial mitral valve regurgitation.  4. The aortic valve is tricuspid. Aortic valve regurgitation is not visualized.  5. The inferior vena cava is normal in size with greater than 50% respiratory variability, suggesting right atrial pressure of 3 mmHg. FINDINGS  Left Ventricle: Left ventricular ejection fraction, by estimation, is 60 to 65%. The left ventricle has normal function. The left ventricle has no regional wall motion abnormalities. The left ventricular internal cavity size was normal in size. There is  no left ventricular hypertrophy. Left ventricular diastolic parameters were normal. Right Ventricle: The right ventricular size is normal.  No increase in right ventricular wall thickness. Right ventricular systolic function is normal. There is normal pulmonary artery systolic pressure. The tricuspid regurgitant velocity is 2.40 m/s, and  with an assumed right atrial pressure of 3 mmHg, the estimated right ventricular systolic pressure is 50.0 mmHg. Left Atrium: Left atrial size was normal in size. Right Atrium: Right atrial size was normal in size. Pericardium: There is no evidence of pericardial effusion. Mitral Valve: The mitral valve is grossly normal. Trivial mitral valve regurgitation. Tricuspid Valve: The tricuspid valve is grossly normal. Tricuspid valve regurgitation is mild. Aortic Valve: The aortic valve is tricuspid. Aortic valve regurgitation is not visualized. Pulmonic Valve: The pulmonic valve was grossly normal. Pulmonic valve regurgitation is trivial. Aorta: The aortic root is normal in size and structure. Venous: The inferior vena cava is normal in size with greater than 50% respiratory variability, suggesting right atrial pressure of 3 mmHg. IAS/Shunts: No atrial level shunt detected by color flow Doppler.  LEFT VENTRICLE PLAX 2D LVIDd:         4.40 cm     Diastology LVIDs:         2.70 cm     LV e' lateral:   14.90 cm/s LV PW:         0.90 cm     LV E/e' lateral: 6.5 LV IVS:        0.80 cm     LV e' medial:    9.14 cm/s                            LV E/e' medial:  10.6  LV Volumes (MOD) LV vol d, MOD A4C: 81.9 ml LV vol s, MOD A4C: 28.5 ml LV SV MOD A4C:     81.9 ml RIGHT VENTRICLE RV Basal diam:  3.80 cm RV S prime:     11.10 cm/s TAPSE (M-mode): 2.6 cm LEFT ATRIUM           Index       RIGHT ATRIUM           Index LA diam:      3.20 cm 1.95 cm/m  RA Area:     15.00 cm LA Vol (  A4C): 43.6 ml 26.58 ml/m RA Volume:   39.30 ml  23.96 ml/m  AORTIC VALVE LVOT Vmax:   119.00 cm/s LVOT Vmean:  77.000 cm/s LVOT VTI:    0.290 m  AORTA Ao Root diam: 2.70 cm MITRAL VALVE               TRICUSPID VALVE MV Area (PHT): 3.48 cm    TR Peak grad:    23.0 mmHg MV Decel Time: 218 msec    TR Vmax:        240.00 cm/s MV E velocity: 96.80 cm/s MV A velocity: 57.40 cm/s  SHUNTS MV E/A ratio:  1.69        Systemic VTI: 0.29 mNona Dellwell MD Electronically signed by Nona Dell MD Signature Date/Time: 12/20/2019/12:22:08 PM    Final    VAS [place] TRANSCRANIAL DOPPLER  Result Date: 12/20/2019  Transcranial Doppler Indications: Spontaneous vertebral artery dissection. History: Left V1 segment remains occluded just beyond its origin. V2 segment is reconstituted at the C5 level Ordered to evaluate vertebral artery flow direction. Performing Technologist: Jeb Levering RDMS, RVT  Examination Guidelines: A complete evaluation includes B-mode imaging, spectral Doppler, color Doppler, and power Doppler as needed of all accessible portions of each vessel. Bilateral testing is considered an integral part of a complete examination. Limited examinations for reoccurring indications may be performed as noted.  +---------+-------------+----------+-----------+-------+ RIGHT TCDRight VM (cm)Depth (cm)PulsatilityComment +---------+-------------+----------+-----------+-------+ Vertebral   -57.00                 1.12            +---------+-------------+----------+-----------+-------+  +---------+------------+----------+-----------+-------+ LEFT TCD Left VM (cm)Depth (cm)PulsatilityComment +---------+------------+----------+-----------+-------+ Vertebral   -18.00                 79             +---------+------------+----------+-----------+-------+  +------------+-------+-------+             VM cm/sComment +------------+-------+-------+ Prox Basilar-33.00         +------------+-------+-------+ Dist Basilar-38.00         +------------+-------+-------+    Preliminary    ____________________________________________  PROCEDURES  Procedure(s) performed:   Procedures ____________________________________________  INITIAL IMPRESSION / ASSESSMENT  AND PLAN / ED COURSE   This patient presents to the ED for concern of dizziness, this involves an extensive number of treatment options, and is a complaint that carries with it a high risk of complications and morbidity.  The differential diagnosis includes stroke, vert dissection, vertigo, near syncope.      Lab Tests:   I Ordered, reviewed, and interpreted labs, which included CBC, CMP and urinalysis which were all unremarkable.  Medicines ordered:   I ordered medication meclizine for vertigo  Imaging Studies ordered:   I independently visualized and interpreted imaging CT head which showed no obvious bleed or dissections.  Additional history obtained:   Additional history obtained from no one  Previous records obtained and reviewed in epic  Consultations Obtained:   I consulted neurology, medicine and discussed lab and imaging findings.  Neurology recommends TIA observation and further work-up for same so medicine will admit.  Not a TPA candidate secondary to low NIHSS  Reevaluation:  After the interventions stated above, I reevaluated the patient and found some improvement her symptoms but still somewhat dizzy  Critical Interventions: Code stroke activation Consideration of TPA Prompt neurologic evaluation  ____________________________________________  FINAL CLINICAL IMPRESSION(S) / ED DIAGNOSES  Final diagnoses:  None    MEDICATIONS  GIVEN DURING THIS VISIT:  Medications  sodium chloride flush (NS) 0.9 % injection 3 mL (has no administration in time range)    NEW OUTPATIENT MEDICATIONS STARTED DURING THIS VISIT:  New Prescriptions   No medications on file    Note:  This note was prepared with assistance of Dragon voice recognition software. Occasional wrong-Odaniel or sound-a-like substitutions may have occurred due to the inherent limitations of voice recognition software.   Jaquaya Coyle, Barbara CowerJason, MD 12/21/19 [phone]

## 2019-12-20 NOTE — H&P (Signed)
History and Physical    Teresa Guzman GQQ:[id number] DOB: [birth year] DOA: 12/19/2019  PCP: Sheliah Hatch, MD  Patient coming from: Home   Chief Complaint:  Chief Complaint  Patient presents with  . Dizziness     HPI:    39 year old female with past medical history of asthma, migraine headaches and right cerebellar stroke suffered in 2017 secondary to right vertebral artery dissection as well as left vertebral artery occlusion identified in late 2019 who presents to [facility] emergency department with complaints of vertigo and unsteady gait.  Patient explains that at approximately 10 PM the evening of 5/14 she was walking down her hallway to check on her child when she suddenly began to experience sudden intense vertigo.  This vertigo was so severe that she began to vomit.  Patient describes the vomitus as nonbilious and nonbloody.  Patient complains of associated blurry vision as well as significant loss of balance when attempting to ambulate.  Patient denies any change in speech or focal weakness.  Patient denies chest pain, shortness of breath, lightheadedness, headaches or fever.  Shortly after the onset of her symptoms she presented to [facility] emergency department for evaluation.  A code stroke was called and the patient was evaluated by Dr. Laurence Slate with neurology.  He felt that the patient symptoms were indeed concerning for an acute posterior circulation stroke or TIA and commended that the patient be admitted to the hospital for further evaluation.  The hospitalist group was then called to assess the patient for admission the hospital.   Review of Systems: A 10-system review of systems has been performed and all systems are negative with the exception of what is listed in the HPI.   Past Medical History:  Diagnosis Date  . Anemia   . Asthma    childhood asthma - no inhaler, no problems as adult  . Bipolar disorder (HCC)    dx while in college, no current  meds  . Depression    no current meds  . Endometriosis   . Headache    HX  MIGRAINES During menstrual cycle  . Hypothyroidism childhood  . Infertility, female   . Seasonal allergies   . Stroke (HCC) 09/09/15   hadright cerebellar infarct secondary to right vertebral artery dissection-no deficits, no treatment, no problems since 09/2015  . Vertebral artery dissection (HCC) 2017    Past Surgical History:  Procedure Laterality Date  . ANTERIOR CERVICAL DECOMP/DISCECTOMY FUSION N/A 03/08/2017   Procedure: ANTERIOR CERVICAL DECOMPRESSION/DISCECTOMY FUSION C5-6;  Surgeon: Venita Lick, MD;  Location: [facility] OR;  Service: Orthopedics;  Laterality: N/A;  3 hours  . arm surgery Left 1987  . CHOLECYSTECTOMY  10/2017   [place].  . COLONOSCOPY  06/2017   at [facility]  . CYST REMOVAL HAND     CYST REMOVED OFF OVARY - Not Hand  . CYSTOSCOPY WITH STENT PLACEMENT Bilateral 11/14/2018   Procedure: CYSTOSCOPY WITH STENT PLACEMENT;  Surgeon: Bjorn Pippin, MD;  Location: [facility] ORS;  Service: Urology;  Laterality: Bilateral;  . DILITATION & CURRETTAGE/HYSTROSCOPY WITH NOVASURE ABLATION N/A 12/13/2017   Procedure: DILATATION & CURETTAGE/HYSTEROSCOPY WITH HYDROTHERMAL   ABLATION;  Surgeon: Shea Evans, MD;  Location: [facility] ORS;  Service: Gynecology;  Laterality: N/A;  . LAPAROSCOPIC OVARIAN CYSTECTOMY Bilateral 12/13/2017   Procedure: LAPAROSCOPY WITH RIGHT OVARIAN CYSTECTOMY, ENDOMETRIOSIS ABLATION, AND EXCISION OF LEFT URETERAL IMPLANT;  Surgeon: Shea Evans, MD;  Location: [facility] ORS;  Service: Gynecology;  Laterality: Bilateral;  . OVARIAN  CYST REMOVAL  2011   [place] abdominal excision  . ROBOTIC ASSISTED LAPAROSCOPIC HYSTERECTOMY AND SALPINGECTOMY Bilateral 11/14/2018   Procedure: XI ROBOTIC ASSISTED LAPAROSCOPIC HYSTERECTOMY AND BILATERAL SALPINGO-OOPHORECTOMY;  Surgeon: Shea EvansMody, Vaishali, MD;  Location: [facility] ORS;  Service: Gynecology;  Laterality: Bilateral;  . WISDOM TOOTH EXTRACTION       reports that she has  never smoked. She has never used smokeless tobacco. She reports that she does not drink alcohol or use drugs.  Allergies  Allergen Reactions  . Morphine And Related Other (See Comments)    Headache for a long time per pt  . Hydrocortisone Rash  . Neosporin [Neomycin-Bacitracin Zn-Polymyx] Rash    Family History  Problem Relation Age of Onset  . Colon cancer Brother 33       chemo, sx   . Hyperlipidemia Mother   . Hypertension Father   . Hyperlipidemia Father   . Heart attack Father   . Diabetes Father      Prior to Admission medications   Medication Sig Start Date End Date Taking? Authorizing Provider  acetaminophen (TYLENOL) 325 MG tablet Take 2 tablets (650 mg total) by mouth every 4 (four) hours as needed for mild pain (temperature > 101.5.). 11/15/18  Yes Shea EvansMody, Vaishali, MD  Azelastine HCl 0.15 % SOLN USE 1 SPRAYS IN EACH NOSTRIL TWICE DAILY Patient taking differently: Place 1 spray into both nostrils daily.  12/04/17  Yes Sheliah Hatchabori, Katherine E, MD  ferrous sulfate 325 (65 FE) MG tablet Take 325 mg by mouth daily with breakfast.   Yes [provider]  fluticasone (FLONASE) 50 MCG/ACT nasal spray USE 1 TO 2 SPRAYS INTO THE  NOSE DAILY Patient taking differently: Place 1 spray into both nostrils 2 (two) times daily.  03/04/15  Yes Sheliah Hatchabori, Katherine E, MD  levothyroxine (SYNTHROID) 112 MCG tablet TAKE 1 TABLET(112 MCG) BY MOUTH DAILY Patient taking differently: Take 112 mcg by mouth at bedtime. TAKE 1 TABLET(112 MCG) BY MOUTH DAILY 09/24/19  Yes Sheliah Hatchabori, Katherine E, MD  Multiple Vitamins-Minerals (ADULT ONE DAILY GUMMIES PO) Take 2 tablets by mouth daily.   Yes [provider]  NON FORMULARY Biote HRT injection every 4 months   Yes [provider]  PROAIR HFA 108 (90 Base) MCG/ACT inhaler INHALE 2 PUFFS INTO THE LUNGS EVERY 6 HOURS AS NEEDED FOR WHEEZING OR SHORTNESS OF BREATH Patient taking differently: Inhale 2 puffs into the lungs every 6 (six) hours as  needed for wheezing or shortness of breath.  10/14/18  Yes Sheliah Hatchabori, Katherine E, MD  simvastatin (ZOCOR) 5 MG tablet TAKE 1 TABLET(5 MG) BY MOUTH DAILY Patient taking differently: Take 5 mg by mouth daily at 6 PM.  09/22/19  Yes Jaffe, Adam R, DO  VITAMIN D PO Take 5,000 Units by mouth daily.    Yes [provider]  zonisamide (ZONEGRAN) 50 MG capsule Take 150 mg by mouth daily. 09/15/19  Yes [provider]  ibuprofen (ADVIL,MOTRIN) 600 MG tablet Take 1 tablet (600 mg total) by mouth every 8 (eight) hours. Patient not taking: Reported on 12/20/2019 11/15/18   Shea EvansMody, Vaishali, MD  indomethacin (INDOCIN) 50 MG capsule TAKE 1 CAPSULE BY MOUTH 30 TO 60 MINUTES BEFORE EXERCISE Patient not taking: Reported on 12/20/2019 01/20/19   Drema DallasJaffe, Adam R, DO    Physical Exam: Vitals:   12/20/19 0100 12/20/19 0130 12/20/19 0300 12/20/19 0315  BP: 115/67 114/79 120/63 114/63  Pulse: 71 64 76 (!) 59  Resp: 20 17 17 14   Temp:  TempSrc:      SpO2: 100% 100% 99% 99%    Constitutional: Acute alert and oriented x3, no associated distress.   Skin: no rashes, no lesions, good skin turgor noted. Eyes: Pupils are equally reactive to light.  No evidence of scleral icterus or conjunctival pallor.  ENMT: Moist mucous membranes noted.  Posterior pharynx clear of any exudate or lesions.   Neck: normal, supple, no masses, no thyromegaly.  No evidence of jugular venous distension.   Respiratory: clear to auscultation bilaterally, no wheezing, no crackles. Normal respiratory effort. No accessory muscle use.  Cardiovascular: Regular rate and rhythm, no murmurs / rubs / gallops. No extremity edema. 2+ pedal pulses. No carotid bruits.  Chest:   Nontender without crepitus or deformity.   Back:   Nontender without crepitus or deformity. Abdomen: Abdomen is soft and nontender.  No evidence of intra-abdominal masses.  Positive bowel sounds noted in all quadrants.   Musculoskeletal: No joint deformity upper and  lower extremities. Good ROM, no contractures. Normal muscle tone.  Neurologic: CN 2-12 grossly intact. Sensation intact, strength noted to be 5 out of 5 in all 4 extremities.  Patient is following all commands.  Patient is responsive to verbal stimuli.   Psychiatric: Patient presents as a normal mood with appropriate affect.  Patient seems to possess insight as to theircurrent situation.     Labs on Admission: I have personally reviewed following labs and imaging studies -   CBC: Recent Labs  Lab 12/19/19 2355 12/20/19 0007  WBC 9.3  --   NEUTROABS 5.0  --   HGB 14.3 15.0  HCT 42.1 44.0  MCV 95.0  --   PLT 220  --    Basic Metabolic Panel: Recent Labs  Lab 12/19/19 2355 12/20/19 0007  NA 138 139  K 4.1 3.8  CL 101 102  CO2 27  --   GLUCOSE 158* 154*  BUN 23* 24*  CREATININE 0.95 0.90  CALCIUM 9.2  --    GFR: CrCl cannot be calculated (Unknown ideal weight.). Liver Function Tests: Recent Labs  Lab 12/19/19 2355  AST 31  ALT 22  ALKPHOS 75  BILITOT 0.4  PROT 6.7  ALBUMIN 4.1   No results for input(s): LIPASE, AMYLASE in the last 168 hours. No results for input(s): AMMONIA in the last 168 hours. Coagulation Profile: Recent Labs  Lab 12/19/19 2355  INR 1.0   Cardiac Enzymes: No results for input(s): CKTOTAL, CKMB, CKMBINDEX, TROPONINI in the last 168 hours. BNP (last 3 results) No results for input(s): PROBNP in the last 8760 hours. HbA1C: No results for input(s): HGBA1C in the last 72 hours. CBG: Recent Labs  Lab 12/20/19 0241  GLUCAP 111*   Lipid Profile: No results for input(s): CHOL, HDL, LDLCALC, TRIG, CHOLHDL, LDLDIRECT in the last 72 hours. Thyroid Function Tests: No results for input(s): TSH, T4TOTAL, FREET4, T3FREE, THYROIDAB in the last 72 hours. Anemia Panel: No results for input(s): VITAMINB12, FOLATE, FERRITIN, TIBC, IRON, RETICCTPCT in the last 72 hours. Urine analysis:    Component Value Date/Time   COLORURINE yellow 05/06/2010  1438   APPEARANCEUR Clear 05/06/2010 1438   LABSPEC 1.010 05/06/2010 1438   PHURINE 6.5 05/06/2010 1438   HGBUR negative 05/06/2010 1438   BILIRUBINUR negative 05/06/2010 1438   UROBILINOGEN 0.2 05/06/2010 1438   NITRITE negative 05/06/2010 1438    Radiological Exams on Admission - Personally Reviewed: CT Angio Head W or Wo Contrast  Result Date: 12/20/2019 CLINICAL DATA:  Vertigo EXAM:  CT ANGIOGRAPHY HEAD AND NECK TECHNIQUE: Multidetector CT imaging of the head and neck was performed using the standard protocol during bolus administration of intravenous contrast. Multiplanar CT image reconstructions and MIPs were obtained to evaluate the vascular anatomy. Carotid stenosis measurements (when applicable) are obtained utilizing NASCET criteria, using the distal internal carotid diameter as the denominator. CONTRAST:  71mL OMNIPAQUE IOHEXOL 350 MG/ML SOLN COMPARISON:  01/14/2019 FINDINGS: CTA NECK FINDINGS SKELETON: There is no bony spinal canal stenosis. No lytic or blastic lesion. OTHER NECK: Normal pharynx, larynx and major salivary glands. No cervical lymphadenopathy. Unremarkable thyroid gland. UPPER CHEST: No pneumothorax or pleural effusion. No nodules or masses. AORTIC ARCH: There is no calcific atherosclerosis of the aortic arch. There is no aneurysm, dissection or hemodynamically significant stenosis of the visualized portion of the aorta. Normal variant aortic arch branching pattern with the left vertebral artery arising independently from the aortic arch. The visualized proximal subclavian arteries are widely patent. RIGHT CAROTID SYSTEM: Normal without aneurysm, dissection or stenosis. LEFT CAROTID SYSTEM: Normal without aneurysm, dissection or stenosis. VERTEBRAL ARTERIES: Right dominant configuration. Left V1 segment remains occluded just beyond its origin. V2 segment is reconstituted at the C5 level. The remainder of the vessel is patent the vertebrobasilar confluence. There is no  dissection, occlusion or flow-limiting stenosis to the skull base (V1-V3 segments). CTA HEAD FINDINGS POSTERIOR CIRCULATION: --Vertebral arteries: Normal V4 segments. --Inferior cerebellar arteries: Normal. --Basilar artery: Normal. --Superior cerebellar arteries: Normal. --Posterior cerebral arteries (PCA): Normal. There are bilateral posterior communicating arteries (p-comm) that partially supply the PCAs. ANTERIOR CIRCULATION: --Intracranial internal carotid arteries: Normal. --Anterior cerebral arteries (ACA): Normal. Both A1 segments are present. Patent anterior communicating artery (a-comm). --Middle cerebral arteries (MCA): Normal. VENOUS SINUSES: As permitted by contrast timing, patent. ANATOMIC VARIANTS: None Review of the MIP images confirms the above findings. IMPRESSION: 1. No intracranial arterial occlusion or high-grade stenosis. 2. Chronic occlusion of the left vertebral artery V1 segment, with reconstitution at the C5 level. Electronically Signed   By: Ulyses Jarred M.D.   On: 12/20/2019 00:38   CT Angio Neck W and/or Wo Contrast  Result Date: 12/20/2019 CLINICAL DATA:  Vertigo EXAM: CT ANGIOGRAPHY HEAD AND NECK TECHNIQUE: Multidetector CT imaging of the head and neck was performed using the standard protocol during bolus administration of intravenous contrast. Multiplanar CT image reconstructions and MIPs were obtained to evaluate the vascular anatomy. Carotid stenosis measurements (when applicable) are obtained utilizing NASCET criteria, using the distal internal carotid diameter as the denominator. CONTRAST:  14mL OMNIPAQUE IOHEXOL 350 MG/ML SOLN COMPARISON:  01/14/2019 FINDINGS: CTA NECK FINDINGS SKELETON: There is no bony spinal canal stenosis. No lytic or blastic lesion. OTHER NECK: Normal pharynx, larynx and major salivary glands. No cervical lymphadenopathy. Unremarkable thyroid gland. UPPER CHEST: No pneumothorax or pleural effusion. No nodules or masses. AORTIC ARCH: There is no  calcific atherosclerosis of the aortic arch. There is no aneurysm, dissection or hemodynamically significant stenosis of the visualized portion of the aorta. Normal variant aortic arch branching pattern with the left vertebral artery arising independently from the aortic arch. The visualized proximal subclavian arteries are widely patent. RIGHT CAROTID SYSTEM: Normal without aneurysm, dissection or stenosis. LEFT CAROTID SYSTEM: Normal without aneurysm, dissection or stenosis. VERTEBRAL ARTERIES: Right dominant configuration. Left V1 segment remains occluded just beyond its origin. V2 segment is reconstituted at the C5 level. The remainder of the vessel is patent the vertebrobasilar confluence. There is no dissection, occlusion or flow-limiting stenosis to the skull base (V1-V3 segments).  CTA HEAD FINDINGS POSTERIOR CIRCULATION: --Vertebral arteries: Normal V4 segments. --Inferior cerebellar arteries: Normal. --Basilar artery: Normal. --Superior cerebellar arteries: Normal. --Posterior cerebral arteries (PCA): Normal. There are bilateral posterior communicating arteries (p-comm) that partially supply the PCAs. ANTERIOR CIRCULATION: --Intracranial internal carotid arteries: Normal. --Anterior cerebral arteries (ACA): Normal. Both A1 segments are present. Patent anterior communicating artery (a-comm). --Middle cerebral arteries (MCA): Normal. VENOUS SINUSES: As permitted by contrast timing, patent. ANATOMIC VARIANTS: None Review of the MIP images confirms the above findings. IMPRESSION: 1. No intracranial arterial occlusion or high-grade stenosis. 2. Chronic occlusion of the left vertebral artery V1 segment, with reconstitution at the C5 level. Electronically Signed   By: Deatra Robinson M.D.   On: 12/20/2019 00:38   MR BRAIN WO CONTRAST  Result Date: 12/20/2019 CLINICAL DATA:  Migraines and dizziness. History of cerebellar infarct. EXAM: MRI HEAD WITHOUT CONTRAST TECHNIQUE: Multiplanar, multiecho pulse sequences of  the brain and surrounding structures were obtained without intravenous contrast. COMPARISON:  Brain MRI 05/25/2018 CTA head neck 12/20/2019 FINDINGS: Brain: No acute infarct, acute hemorrhage or extra-axial collection. Old right cerebellar infarct. Normal volume of CSF spaces. No chronic microhemorrhage. Normal midline structures. Vascular: Normal flow voids. Skull and upper cervical spine: Normal marrow signal. Sinuses/Orbits: Negative. Other: None. IMPRESSION: 1. No acute intracranial abnormality. 2. Old right cerebellar infarct. Otherwise normal MRI of the brain. Electronically Signed   By: Deatra Robinson M.D.   On: 12/20/2019 02:50   CT HEAD CODE STROKE WO CONTRAST  Result Date: 12/20/2019 CLINICAL DATA:  Code stroke.  Dizziness EXAM: CT HEAD WITHOUT CONTRAST TECHNIQUE: Contiguous axial images were obtained from the base of the skull through the vertex without intravenous contrast. COMPARISON:  None. FINDINGS: Brain: There is no mass, hemorrhage or extra-axial collection. The size and configuration of the ventricles and extra-axial CSF spaces are normal. The brain parenchyma is normal, without evidence of acute or chronic infarction. Vascular: No abnormal hyperdensity of the major intracranial arteries or dural venous sinuses. No intracranial atherosclerosis. Skull: The visualized skull base, calvarium and extracranial soft tissues are normal. Sinuses/Orbits: No fluid levels or advanced mucosal thickening of the visualized paranasal sinuses. No mastoid or middle ear effusion. The orbits are normal. ASPECTS [facility] Stroke Program Early CT Score) - Ganglionic level infarction (caudate, lentiform nuclei, internal capsule, insula, M1-M3 cortex): 7 - Supraganglionic infarction (M4-M6 cortex): 3 Total score (0-10 with 10 being normal): 10 IMPRESSION: 1. Normal head CT. 2. ASPECTS is 10. * These results were communicated to Dr. Georgiana Spinner Aroor at 12:23 am on 12/20/2019 by text page via the [facility] messaging system.  Electronically Signed   By: Deatra Robinson M.D.   On: 12/20/2019 00:23    Telemetry: Personally reviewed.  Rhythm is normal sinus rhythm with heart rate of 80 bpm.  No dynamic ST segment changes appreciated.  Assessment/Plan Active Problems:   TIA (transient ischemic attack)   Patient presenting with sudden onset of intense vertigo with associated blurry vision and unsteady gait.  Upon my evaluation of the patient several hours after symptom onset the patient symptoms are already resolving.  MRI without contrast revealing no evidence of acute stroke.  CT angiogram of head and neck additionally revealing no evidence of acute disease.  Most likely cause of patient's symptoms was a TIA however if patient's symptoms consistently recur with changes in body position other diagnoses such as BPPV could be considered.  Per neurology recommendations, patient has been placed on aspirin and high intensity statin therapy with atorvastatin 40 mg  daily.  Performing serial neurologic checks  Monitoring patient on telemetry  Echocardiogram ordered  Obtaining PT/OT evaluation.  Permissive hypertension  Hemoglobin A1c and lipid panel have been ordered and are pending.    Hypothyroidism  Continue home regimen of levothyroxine  TSH pending    Mild intermittent asthma, uncomplicated   No clinical evidence of asthma exacerbation  .  Bronchodilator therapy for shortness of breath or wheezing.   Code Status:  Full code Family Communication: Husband is at bedside and has been updated on plan of care.  Status is: Observation  The patient remains OBS appropriate and will d/c before 2 midnights.  Dispo: The patient is from: Home              Anticipated d/c is to: Home              Anticipated d/c date is: 2 days              Patient currently is not medically stable to d/c.        Marinda Elk MD [facility] Pager [phone]  If 7PM-7AM, please contact  night-coverage [email or web address] Use universal [place] password for that web site. If you do not have the password, please call the hospital operator.  12/20/2019, 3:53 AM

## 2019-12-20 NOTE — ED Notes (Signed)
Attending at bedside.

## 2019-12-20 NOTE — ED Notes (Signed)
Transported to MRI

## 2019-12-20 NOTE — Progress Notes (Signed)
TCD       has been completed. Preliminary results can be found under CV proc through chart review. Brenin Heidelberger, BS, RDMS, RVT   

## 2019-12-20 NOTE — Evaluation (Signed)
Physical Therapy Evaluation & Discharge Patient Details Name: Teresa Guzman MRN: [id number] DOB: [birth year] Today's Date: 12/20/2019   History of Present Illness  Pt is a 39 y/o female admitted secondary to gait instability, dizziness and vomitting. MRI of the brain was negative for any acute findings. PMH including but not limited to asthma, migraine headaches and right cerebellar stroke in 2017 secondary to right vertebral artery dissection as well as left vertebral artery occlusion identified in late 2019.    Clinical Impression  Pt presented supine in bed with HOB elevated, awake and willing to participate in therapy session. Prior to admission, pt reported that she was independent with all functional mobility and ADLs. Pt lives with her husband and four adopted children in a single level home with three steps to enter. At the time of evaluation, pt moving very well overall and able to perform all functional mobility without difficulties. Pt asymptomatic throughout with the exception of vertical head turns in sitting and with gait. Mild dizziness reported. Pt's RN was notified. No further acute PT needs identified at this time. PT signing off.   BP supine = 125/82 BP sitting = 126/85 BP standing = 129/82 All VSS throughout.    Follow Up Recommendations No PT follow up    Equipment Recommendations  None recommended by PT    Recommendations for Other Services       Precautions / Restrictions Precautions Precautions: Fall Restrictions Weight Bearing Restrictions: No      Mobility  Bed Mobility Overal bed mobility: Modified Independent                Transfers Overall transfer level: Modified independent Equipment used: None                Ambulation/Gait Ambulation/Gait assistance: Supervision Gait Distance (Feet): 200 Feet Assistive device: None Gait Pattern/deviations: Step-through pattern;Decreased stride length Gait velocity: able to fluctuate   General  Gait Details: pt steady overall with gait, able to participate in a higher level balance assessment. No LOB or need for physical assistance  Stairs            Wheelchair Mobility    Modified Rankin (Stroke Patients Only) Modified Rankin (Stroke Patients Only) Pre-Morbid Rankin Score: No symptoms Modified Rankin: No significant disability     Balance Overall balance assessment: Independent                               Standardized Balance Assessment Standardized Balance Assessment : Dynamic Gait Index   Dynamic Gait Index Level Surface: Normal Change in Gait Speed: Normal Gait with Horizontal Head Turns: Normal Gait with Vertical Head Turns: Mild Impairment Gait and Pivot Turn: Normal Step Over Obstacle: Normal Step Around Obstacles: Normal Steps: Normal Total Score: 23       Pertinent Vitals/Pain Pain Assessment: No/denies pain    Home Living Family/patient expects to be discharged to:: Private residence Living Arrangements: Spouse/significant other;Children Available Help at Discharge: Family;Available 24 hours/day Type of Home: House Home Access: Stairs to enter Entrance Stairs-Rails: None Entrance Stairs-Number of Steps: 3 Home Layout: One level Home Equipment: None      Prior Function Level of Independence: Independent         Comments: does crossfit, active lifestyle as a mom     Hand Dominance   Dominant Hand: Right    Extremity/Trunk Assessment   Upper Extremity Assessment Upper Extremity Assessment: Defer to OT evaluation;Overall  WFL for tasks assessed    Lower Extremity Assessment Lower Extremity Assessment: Overall WFL for tasks assessed    Cervical / Trunk Assessment Cervical / Trunk Assessment: Normal  Communication   Communication: No difficulties  Cognition Arousal/Alertness: Awake/alert Behavior During Therapy: WFL for tasks assessed/performed Overall Cognitive Status: Within Functional Limits for tasks  assessed                                        General Comments      Exercises     Assessment/Plan    PT Assessment Patent does not need any further PT services  PT Problem List         PT Treatment Interventions      PT Goals (Current goals can be found in the Care Plan section)  Acute Rehab PT Goals Patient Stated Goal: "home today" PT Goal Formulation: All assessment and education complete, DC therapy    Frequency     Barriers to discharge        Co-evaluation PT/OT/SLP Co-Evaluation/Treatment: Yes Reason for Co-Treatment: To address functional/ADL transfers PT goals addressed during session: Mobility/safety with mobility;Balance;Proper use of DME;Strengthening/ROM         AM-PAC PT "6 Clicks" Mobility  Outcome Measure Help needed turning from your back to your side while in a flat bed without using bedrails?: None Help needed moving from lying on your back to sitting on the side of a flat bed without using bedrails?: None Help needed moving to and from a bed to a chair (including a wheelchair)?: None Help needed standing up from a chair using your arms (e.g., wheelchair or bedside chair)?: None Help needed to walk in hospital room?: None Help needed climbing 3-5 steps with a railing? : None 6 Click Score: 24    End of Session   Activity Tolerance: Patient tolerated treatment well Patient left: in bed;with call bell/phone within reach Nurse Communication: Mobility status PT Visit Diagnosis: Other abnormalities of gait and mobility (R26.89)    Time: 3888-2800 PT Time Calculation (min) (ACUTE ONLY): 19 min   Charges:   PT Evaluation $PT Eval Low Complexity: 1 Low          Eduard Clos, PT, DPT  Acute Rehabilitation Services Pager [phone] Office [place] 12/20/2019, 11:47 AM

## 2019-12-20 NOTE — Progress Notes (Addendum)
STROKE TEAM PROGRESS NOTE   INTERVAL HISTORY Her husband is at the bedside.  Pt sitting in bed, stated that last night she was walking the hallway all of a sudden started having dizziness, room spinning, noted to walk, had to sit down in the middle the hallway with one episode of vomiting.  She continued to have difficulty walking so decided come to ER for evaluation.  Symptoms lasted about 2 to 3 hours and resolved.  Some lingering symptoms with motion feeding when moving head up or down.  Definitely has been subsided.  CTA head neck continues to show right VA patent but small caliber, left VA proximal occlusion with distal reconstitution with small caliber.  MRI negative for stroke.  OBJECTIVE Vitals:   12/20/19 1045 12/20/19 1100 12/20/19 1115 12/20/19 1212  BP:    (!) 105/54  Pulse: (!) 59 72 (!) 59 67  Resp: 17 20 18  (!) 22  Temp:      TempSrc:      SpO2: 100% 99% 100% 100%    CBC:  Recent Labs  Lab 12/19/19 2355 12/19/19 2355 12/20/19 0007 12/20/19 0411  WBC 9.3  --   --  6.6  NEUTROABS 5.0  --   --   --   HGB 14.3   < > 15.0 12.2  HCT 42.1   < > 44.0 37.2  MCV 95.0  --   --  95.1  PLT 220  --   --  185   < > = values in this interval not displayed.    Basic Metabolic Panel:  Recent Labs  Lab 12/19/19 2355 12/19/19 2355 12/20/19 0007 12/20/19 0411  NA 138   < > 139 139  K 4.1   < > 3.8 3.7  CL 101   < > 102 111  CO2 27  --   --  22  GLUCOSE 158*   < > 154* 101*  BUN 23*   < > 24* 15  CREATININE 0.95   < > 0.90 0.77  CALCIUM 9.2  --   --  7.7*   < > = values in this interval not displayed.    Lipid Panel:     Component Value Date/Time   CHOL 143 12/20/2019 0411   TRIG 115 12/20/2019 0411   HDL 42 12/20/2019 0411   CHOLHDL 3.4 12/20/2019 0411   VLDL 23 12/20/2019 0411   LDLCALC 78 12/20/2019 0411   HgbA1c:  Lab Results  Component Value Date   HGBA1C 5.6 12/20/2019   Urine Drug Screen:     Component Value Date/Time   LABOPIA NONE DETECTED  12/20/2019 0350   COCAINSCRNUR NONE DETECTED 12/20/2019 0350   LABBENZ NONE DETECTED 12/20/2019 0350   AMPHETMU NONE DETECTED 12/20/2019 0350   THCU NONE DETECTED 12/20/2019 0350   LABBARB NONE DETECTED 12/20/2019 0350    Alcohol Level     Component Value Date/Time   ETH <10 12/20/2019 0008    IMAGING  CT Angio Neck W and/or Wo Contrast CT Angio Head W or Wo Contrast 12/20/2019 IMPRESSION:  1. No intracranial arterial occlusion or high-grade stenosis.  2. Chronic occlusion of the left vertebral artery V1 segment, with reconstitution at the C5 level.  MR BRAIN WO CONTRAST 12/20/2019 IMPRESSION:  1. No acute intracranial abnormality.  2. Old right cerebellar infarct. Otherwise normal MRI of the brain.   CT HEAD CODE STROKE WO CONTRAST 12/20/2019 IMPRESSION:  1. Normal head CT.  2. ASPECTS is 10.  Transthoracic Echocardiogram  12/20/2019 IMPRESSIONS  1. Left ventricular ejection fraction, by estimation, is 60 to 65%. The  left ventricle has normal function. The left ventricle has no regional  wall motion abnormalities. Left ventricular diastolic parameters were  normal.  2. Right ventricular systolic function is normal. The right ventricular  size is normal. There is normal pulmonary artery systolic pressure. The  estimated right ventricular systolic pressure is 63.0 mmHg.  3. The mitral valve is grossly normal. Trivial mitral valve  regurgitation.  4. The aortic valve is tricuspid. Aortic valve regurgitation is not  visualized.  5. The inferior vena cava is normal in size with greater than 50%  respiratory variability, suggesting right atrial pressure of 3 mmHg.    ECG - SR rate 58 BPM. (See cardiology reading for complete details)   PHYSICAL EXAM  Temp:  [98.1 F (36.7 C)] 98.1 F (36.7 C) (05/15 1800) Pulse Rate:  [57-100] 85 (05/15 1400) Resp:  [10-23] 17 (05/15 1800) BP: (101-137)/(39-109) 117/71 (05/15 1800) SpO2:  [97 %-100 %] 100 % (05/15  1800)  General - Well nourished, well developed, in no apparent distress.  Ophthalmologic - fundi not visualized due to noncooperation.  Cardiovascular - Regular rhythm and rate.  Mental Status -  Level of arousal and orientation to time, place, and person were intact. Language including expression, naming, repetition, comprehension was assessed and found intact. Fund of Knowledge was assessed and was intact.  Cranial Nerves II - XII - II - Visual field intact OU. III, IV, VI - Extraocular movements intact. V - Facial sensation intact bilaterally. VII - Facial movement intact bilaterally. VIII - Hearing & vestibular intact bilaterally. X - Palate elevates symmetrically. XI - Chin turning & shoulder shrug intact bilaterally. XII - Tongue protrusion intact.  Motor Strength - The patient's strength was normal in all extremities and pronator drift was absent.  Bulk was normal and fasciculations were absent.   Motor Tone - Muscle tone was assessed at the neck and appendages and was normal.  Reflexes - The patient's reflexes were symmetrical in all extremities and she had no pathological reflexes.  Sensory - Light touch, temperature/pinprick were assessed and were symmetrical.    Coordination - The patient had normal movements in the hands and feet with no ataxia or dysmetria.  Tremor was absent.  Gait and Station - deferred.   ASSESSMENT/PLAN Teresa Guzman is a 39 y.o. female with history of right cerebellar stroke, remote bipolar disorder, hypothyroidism, anemia, history of spontaneous bilateral vertebral dissections and migraines presents to the emergency department sudden onset dizziness and vomiting associated with gait imbalance at 10 PM. She did not receive IV t-PA due to mild deficits.  TIA due to b/l vertebral stenosis vs basilar type complicated migraine  Code Stroke CT Head - Normal head CT. ASPECTS is 10.   MRI head - No acute intracranial abnormality. Old right  cerebellar infarct.   CTA H&N - No intracranial arterial occlusion or high-grade stenosis. Chronic occlusion of the left vertebral artery V1 segment, with reconstitution at the C5 level.  Transcranial doppler - b/l antegrade flow of VA  Carotid Doppler - CTA neck performed - carotid dopplers not indicated.  2D Echo - EF 60 - 65%. No cardiac source of emboli identified.   LDL - 78  HgbA1c - 5.6  UDS - negative  VTE prophylaxis - Lovenox  No antithrombotic prior to admission, now on aspirin 81 mg daily and clopidogrel 75 mg daily. Continue DAPT  for 3 months and then ASA alone  Patient counseled to be compliant with her antithrombotic medications  Ongoing aggressive stroke risk factor management  Therapy recommendations:  No PT f/u  Disposition:  Pending  History of stroke Bilateral VA stenosis/dissection/occlusion ?? Bow hunter syndrome  09/2015 admitted for dizziness, MRI showed right cerebellum infarct.  MRA right VA stenosis concerning for dissection.  CTA head and neck right VA focal dissection.  LDL 76 A1c 5.7 EF 60 to 65%.  DAPT for 3 months and Lipitor 10.  12/2015 CTA neck right VA dissection resolved  01/2017 MRI C-spine showed severe spinal stenosis C5-6  03/2017 C-spine surgery with C5-6 ACDF  05/2018 CTA head and neck left VA we 1 occlusion with distal reconstitution.  MRI notes infarct.  Right VA patent  01/2019 CTA head and neck unchanged  This admission CT head and neck unchanged  Put on aspirin and Plavix DAPT for 3 months and then aspirin alone.  Continue statin with Lipitor 40  Refer to endovascular neurosurgery Dr. Conchita Paris for outpatient dynamic cerebral angiogram to rule out [facility] syndrome  Also put on verapamil 120 mg CR nightly for presumed effect of vasodilation  ?  Basilar type migraine  History of migraine  Has been follow with [facility]  Currently on Zonegran  Cannot rule out the basilar type migraine episode this  time  Put on verapamil 120 mg CR nightly for basilar type migraine prevention  Monitor BP at home  Continue follow-up with Dr. Everlena Cooper and adjust verapamil dose if needed  BP management  Home BP meds: none   Current BP meds: none   Stable - somewhat low BP . Long-term BP goal normotensive . Check BP at home and record  Hyperlipidemia  Home Lipid lowering medication: Zocor 5 mg daily  LDL 78, goal < 70  Current lipid lowering medication: Lipitor 40 mg daily   Continue statin at discharge  Other Stroke Risk Factors    Other Active Problems  Code status - Full code   Hospital day # 0  I spent  35 minutes in total face-to-face time with the patient, more than 50% of which was spent in counseling and coordination of care, reviewing test results, images and medication, and discussing the diagnosis, treatment plan and potential prognosis. This patient's care requiresreview of multiple databases, neurological assessment, discussion with family, other specialists and medical decision making of high complexity.  I had long discussion with patient and husband at bedside, updated pt current condition, treatment plan and potential prognosis, and answered all the questions.  They expressed understanding and appreciation.  I also discussed with Dr. Jarvis Newcomer.  Neurology will sign off. Please call with questions. Pt will follow up with Dr. Everlena Cooper at [facility] in about 4 weeks. Thanks for the consult.   Marvel Plan, MD PhD Stroke Neurology 12/20/2019 7:36 PM   To contact Stroke Continuity provider, please refer to [email or web address]. After hours, contact General Neurology

## 2019-12-20 NOTE — Discharge Summary (Signed)
Physician Discharge Summary  Teresa Guzman GYF:[id number] DOB: 08/13/1980 DOA: 12/19/2019  PCP: Sheliah Hatch, MD  Admit date: 12/19/2019 Discharge date: 12/20/2019  Admitted From: Home Disposition: Home   Recommendations for Outpatient Follow-up:  1. Follow up with PCP in 1-2 weeks 2. Follow up with neurology, Dr. Everlena Cooper. 3. Follow up with neurosurgery, Dr. Conchita Paris, for cerebral angiogram 4. Monitor LDL, changed simvastatin to atorvastatin 5. Monitor HR, BP, started verapamil for basilar migraine prophylaxis and vasodilation 6. Monitor for bleeding, started aspirin and plavix  Home Health: None Equipment/Devices: None Discharge Condition: Stable CODE STATUS: Full Diet recommendation: Heart healthy  Brief/Interim Summary: 39 year old female with past medical history of asthma, migraine headaches and right cerebellar stroke suffered in 2017 secondary to right vertebral artery dissection as well as left vertebral artery occlusion identified in late 2019 who presents to [facility] emergency department with complaints of vertigo and unsteady gait.  Patient explains that at approximately 10 PM the evening of 5/14 she was walking down her hallway to check on her child when she suddenly began to experience sudden intense vertigo.  This vertigo was so severe that she began to vomit.  Patient describes the vomitus as nonbilious and nonbloody.  Patient complains of associated blurry vision as well as significant loss of balance when attempting to ambulate.  Patient denies any change in speech or focal weakness.  Patient denies chest pain, shortness of breath, lightheadedness, headaches or fever.  Shortly after the onset of her symptoms she presented to [facility] emergency department for evaluation.  A code stroke was called and the patient was evaluated by Dr. Laurence Slate with neurology.  He felt that the patient symptoms were indeed concerning for an acute posterior circulation  stroke or TIA and commended that the patient be admitted to the hospital for further evaluation.  The hospitalist group was then called to assess the patient for admission the hospital.  Work up included transcranial doppler and MRI of the cervical spine (see below). Her symptoms significantly improved and she requested discharge later in the day of admission. Neurology recommended cerebral angiography, reevaluated the patient, clearing her for discharge with plans for cerebral angiogram as an outpatient. See below for further details.  Discharge Diagnoses:  Active Problems:   Hypothyroidism   TIA (transient ischemic attack)   Mild intermittent asthma, uncomplicated  TIA vs. basilar migraine: No stroke on MRI.  - Augment simvastatin to atorvastatin with LDL 78 - No evidence of diabetes - Neurology recommends initiation of dual antiplatelet therapy and follow up for cerebral angiogram as an outpatient.  - Symptoms have improved, no therapy follow up recommended. - Started verapamil for possible basilar migraine.  - Referred to endovascular neurosurgery for dynamic cerebral angiogram to rule out Bow [place] syndrome.  Discharge Instructions Discharge Instructions    Diet - low sodium heart healthy   Complete by: As directed    Discharge instructions   Complete by: As directed    Start taking aspirin and plavix daily Change simvastatin to atorvastatin Start taking verapamil at night Follow up with Dr. Everlena Cooper and your PCP in the next couple weeks If you don't hear from Dr. Val Riles office, please call them to arrange follow up with plans for cerebral angiogram. If your symptoms return/worsen, seek medical attention right away.   Increase activity slowly   Complete by: As directed      Allergies as of 12/20/2019      Reactions   Morphine And Related Other (  See Comments)   Headache for a long time per pt   Hydrocortisone Rash   Neosporin [neomycin-bacitracin Zn-polymyx] Rash       Medication List    STOP taking these medications   ibuprofen 600 MG tablet Commonly known as: ADVIL   indomethacin 50 MG capsule Commonly known as: INDOCIN   simvastatin 5 MG tablet Commonly known as: ZOCOR     TAKE these medications   acetaminophen 325 MG tablet Commonly known as: TYLENOL Take 2 tablets (650 mg total) by mouth every 4 (four) hours as needed for mild pain (temperature > 101.5.).   ADULT ONE DAILY GUMMIES PO Take 2 tablets by mouth daily.   aspirin 81 MG EC tablet Take 1 tablet (81 mg total) by mouth daily. Start taking on: Dec 21, 2019   atorvastatin 40 MG tablet Commonly known as: LIPITOR Take 1 tablet (40 mg total) by mouth daily. Start taking on: Dec 21, 2019   Azelastine HCl 0.15 % Soln USE 1 SPRAYS IN EACH NOSTRIL TWICE DAILY What changed:   how much to take  how to take this  when to take this  additional instructions   clopidogrel 75 MG tablet Commonly known as: PLAVIX Take 1 tablet (75 mg total) by mouth daily. Start taking on: Dec 21, 2019   ferrous sulfate 325 (65 FE) MG tablet Take 325 mg by mouth daily with breakfast.   fluticasone 50 MCG/ACT nasal spray Commonly known as: FLONASE USE 1 TO 2 SPRAYS INTO THE  NOSE DAILY What changed:   how much to take  how to take this  when to take this  additional instructions   levothyroxine 112 MCG tablet Commonly known as: SYNTHROID TAKE 1 TABLET(112 MCG) BY MOUTH DAILY What changed:   how much to take  how to take this  when to take this   NON FORMULARY Biote HRT injection every 4 months   ProAir HFA 108 (90 Base) MCG/ACT inhaler Generic drug: albuterol INHALE 2 PUFFS INTO THE LUNGS EVERY 6 HOURS AS NEEDED FOR WHEEZING OR SHORTNESS OF BREATH What changed: See the new instructions.   verapamil 120 MG CR tablet Commonly known as: CALAN-SR Take 1 tablet (120 mg total) by mouth at bedtime.   VITAMIN D PO Take 5,000 Units by mouth daily.   zonisamide 50 MG  capsule Commonly known as: ZONEGRAN Take 150 mg by mouth daily.      Follow-up Information    Sheliah Hatch, MD. Schedule an appointment as soon as possible for a visit.   Specialty: Family Medicine Contact information: [address] [place] [address] [phone]        Drema Dallas, DO. Schedule an appointment as soon as possible for a visit.   Specialty: Neurology Contact information: [address]  [address] [place] [address] [phone]        Lisbeth Renshaw, MD. Schedule an appointment as soon as possible for a visit.   Specialty: Neurosurgery Contact information: [address] [place] [address] [phone]          Allergies  Allergen Reactions  . Morphine And Related Other (See Comments)    Headache for a long time per pt  . Hydrocortisone Rash  . Neosporin [Neomycin-Bacitracin Zn-Polymyx] Rash    Consultations:  Neurology  Procedures/Studies: CT Angio Head W or Wo Contrast  Result Date: 12/20/2019 CLINICAL DATA:  Vertigo EXAM: CT ANGIOGRAPHY HEAD AND NECK TECHNIQUE: Multidetector CT  imaging of the head and neck was performed using the standard protocol during bolus administration of intravenous contrast. Multiplanar CT image reconstructions and MIPs were obtained to evaluate the vascular anatomy. Carotid stenosis measurements (when applicable) are obtained utilizing NASCET criteria, using the distal internal carotid diameter as the denominator. CONTRAST:  76mL OMNIPAQUE IOHEXOL 350 MG/ML SOLN COMPARISON:  01/14/2019 FINDINGS: CTA NECK FINDINGS SKELETON: There is no bony spinal canal stenosis. No lytic or blastic lesion. OTHER NECK: Normal pharynx, larynx and major salivary glands. No cervical lymphadenopathy. Unremarkable thyroid gland. UPPER CHEST: No pneumothorax or pleural effusion. No nodules or masses. AORTIC ARCH: There is no calcific atherosclerosis of the aortic arch. There is no aneurysm,  dissection or hemodynamically significant stenosis of the visualized portion of the aorta. Normal variant aortic arch branching pattern with the left vertebral artery arising independently from the aortic arch. The visualized proximal subclavian arteries are widely patent. RIGHT CAROTID SYSTEM: Normal without aneurysm, dissection or stenosis. LEFT CAROTID SYSTEM: Normal without aneurysm, dissection or stenosis. VERTEBRAL ARTERIES: Right dominant configuration. Left V1 segment remains occluded just beyond its origin. V2 segment is reconstituted at the C5 level. The remainder of the vessel is patent the vertebrobasilar confluence. There is no dissection, occlusion or flow-limiting stenosis to the skull base (V1-V3 segments). CTA HEAD FINDINGS POSTERIOR CIRCULATION: --Vertebral arteries: Normal V4 segments. --Inferior cerebellar arteries: Normal. --Basilar artery: Normal. --Superior cerebellar arteries: Normal. --Posterior cerebral arteries (PCA): Normal. There are bilateral posterior communicating arteries (p-comm) that partially supply the PCAs. ANTERIOR CIRCULATION: --Intracranial internal carotid arteries: Normal. --Anterior cerebral arteries (ACA): Normal. Both A1 segments are present. Patent anterior communicating artery (a-comm). --Middle cerebral arteries (MCA): Normal. VENOUS SINUSES: As permitted by contrast timing, patent. ANATOMIC VARIANTS: None Review of the MIP images confirms the above findings. IMPRESSION: 1. No intracranial arterial occlusion or high-grade stenosis. 2. Chronic occlusion of the left vertebral artery V1 segment, with reconstitution at the C5 level. Electronically Signed   By: Deatra Robinson M.D.   On: 12/20/2019 00:38   CT Angio Neck W and/or Wo Contrast  Result Date: 12/20/2019 CLINICAL DATA:  Vertigo EXAM: CT ANGIOGRAPHY HEAD AND NECK TECHNIQUE: Multidetector CT imaging of the head and neck was performed using the standard protocol during bolus administration of intravenous  contrast. Multiplanar CT image reconstructions and MIPs were obtained to evaluate the vascular anatomy. Carotid stenosis measurements (when applicable) are obtained utilizing NASCET criteria, using the distal internal carotid diameter as the denominator. CONTRAST:  38mL OMNIPAQUE IOHEXOL 350 MG/ML SOLN COMPARISON:  01/14/2019 FINDINGS: CTA NECK FINDINGS SKELETON: There is no bony spinal canal stenosis. No lytic or blastic lesion. OTHER NECK: Normal pharynx, larynx and major salivary glands. No cervical lymphadenopathy. Unremarkable thyroid gland. UPPER CHEST: No pneumothorax or pleural effusion. No nodules or masses. AORTIC ARCH: There is no calcific atherosclerosis of the aortic arch. There is no aneurysm, dissection or hemodynamically significant stenosis of the visualized portion of the aorta. Normal variant aortic arch branching pattern with the left vertebral artery arising independently from the aortic arch. The visualized proximal subclavian arteries are widely patent. RIGHT CAROTID SYSTEM: Normal without aneurysm, dissection or stenosis. LEFT CAROTID SYSTEM: Normal without aneurysm, dissection or stenosis. VERTEBRAL ARTERIES: Right dominant configuration. Left V1 segment remains occluded just beyond its origin. V2 segment is reconstituted at the C5 level. The remainder of the vessel is patent the vertebrobasilar confluence. There is no dissection, occlusion or flow-limiting stenosis to the skull base (V1-V3 segments). CTA HEAD FINDINGS POSTERIOR CIRCULATION: --Vertebral arteries: Normal  V4 segments. --Inferior cerebellar arteries: Normal. --Basilar artery: Normal. --Superior cerebellar arteries: Normal. --Posterior cerebral arteries (PCA): Normal. There are bilateral posterior communicating arteries (p-comm) that partially supply the PCAs. ANTERIOR CIRCULATION: --Intracranial internal carotid arteries: Normal. --Anterior cerebral arteries (ACA): Normal. Both A1 segments are present. Patent anterior  communicating artery (a-comm). --Middle cerebral arteries (MCA): Normal. VENOUS SINUSES: As permitted by contrast timing, patent. ANATOMIC VARIANTS: None Review of the MIP images confirms the above findings. IMPRESSION: 1. No intracranial arterial occlusion or high-grade stenosis. 2. Chronic occlusion of the left vertebral artery V1 segment, with reconstitution at the C5 level. Electronically Signed   By: Deatra RobinsonKevin  Herman M.D.   On: 12/20/2019 00:38   MR BRAIN WO CONTRAST  Result Date: 12/20/2019 CLINICAL DATA:  Migraines and dizziness. History of cerebellar infarct. EXAM: MRI HEAD WITHOUT CONTRAST TECHNIQUE: Multiplanar, multiecho pulse sequences of the brain and surrounding structures were obtained without intravenous contrast. COMPARISON:  Brain MRI 05/25/2018 CTA head neck 12/20/2019 FINDINGS: Brain: No acute infarct, acute hemorrhage or extra-axial collection. Old right cerebellar infarct. Normal volume of CSF spaces. No chronic microhemorrhage. Normal midline structures. Vascular: Normal flow voids. Skull and upper cervical spine: Normal marrow signal. Sinuses/Orbits: Negative. Other: None. IMPRESSION: 1. No acute intracranial abnormality. 2. Old right cerebellar infarct. Otherwise normal MRI of the brain. Electronically Signed   By: Deatra RobinsonKevin  Herman M.D.   On: 12/20/2019 02:50   MR CERVICAL SPINE WO CONTRAST  Result Date: 12/20/2019 CLINICAL DATA:  Cervical spinal stenosis. EXAM: MRI CERVICAL SPINE WITHOUT CONTRAST TECHNIQUE: Multiplanar, multisequence MR imaging of the cervical spine was performed. No intravenous contrast was administered. COMPARISON:  01/26/2017 FINDINGS: The axial gradient echo sequence is moderately to severely motion degraded. Alignment: Cervical spine straightening. No listhesis. Vertebrae: Interval C5-6 ACDF. No fracture, suspicious osseous lesion, or significant marrow edema. Cord: Normal signal and morphology. Posterior Fossa, vertebral arteries, paraspinal tissues: Abnormal  appearance of the proximal left vertebral artery corresponding to chronic occlusion, more fully evaluated on today's earlier CTA. Posterior fossa more fully evaluated on today's earlier brain MRI. No acute paraspinal soft tissue abnormality. Disc levels: C2-3: Negative. C3-4: Left greater than right uncovertebral spurring results in new mild left neural foraminal stenosis without spinal stenosis. C4-5: Minimal disc bulging without stenosis. C5-6: ACDF. No stenosis. C6-7: A new left paracentral to left foraminal disc protrusion results in mild left neural foraminal stenosis without spinal stenosis. C7-T1: Negative. IMPRESSION: 1. Interval C5-6 ACDF without stenosis. 2. New leftward disc protrusion at C6-7 with mild left neural foraminal stenosis. 3. New mild left neural foraminal stenosis at C3-4 due to uncovertebral spurring. Electronically Signed   By: Sebastian AcheAllen  Grady M.D.   On: 12/20/2019 15:43   ECHOCARDIOGRAM COMPLETE  Result Date: 12/20/2019    ECHOCARDIOGRAM REPORT   Patient Name:   Teresa Guzman Date of Exam: 12/20/2019 Medical Rec #:  [id number]   Height:       60.0 in Accession #:    [id number]  Weight:       147.5 lb Date of Birth:  07/29/1981   BSA:          1.640 m Patient Age:    38 years    BP:           129/82 mmHg Patient Gender: F           HR:           61 bpm. Exam Location:  Inpatient Procedure: 2D Echo, Color Doppler and Cardiac Doppler Indications:  TIA 435.9  History:        Patient has prior history of Echocardiogram examinations, most                 recent 09/10/2015.  Sonographer:    Merrie Roof RDCS Referring Phys: [id number] [place]  1. Left ventricular ejection fraction, by estimation, is 60 to 65%. The left ventricle has normal function. The left ventricle has no regional wall motion abnormalities. Left ventricular diastolic parameters were normal.  2. Right ventricular systolic function is normal. The right ventricular size is normal. There is normal pulmonary artery  systolic pressure. The estimated right ventricular systolic pressure is 15.1 mmHg.  3. The mitral valve is grossly normal. Trivial mitral valve regurgitation.  4. The aortic valve is tricuspid. Aortic valve regurgitation is not visualized.  5. The inferior vena cava is normal in size with greater than 50% respiratory variability, suggesting right atrial pressure of 3 mmHg. FINDINGS  Left Ventricle: Left ventricular ejection fraction, by estimation, is 60 to 65%. The left ventricle has normal function. The left ventricle has no regional wall motion abnormalities. The left ventricular internal cavity size was normal in size. There is  no left ventricular hypertrophy. Left ventricular diastolic parameters were normal. Right Ventricle: The right ventricular size is normal. No increase in right ventricular wall thickness. Right ventricular systolic function is normal. There is normal pulmonary artery systolic pressure. The tricuspid regurgitant velocity is 2.40 m/s, and  with an assumed right atrial pressure of 3 mmHg, the estimated right ventricular systolic pressure is 76.1 mmHg. Left Atrium: Left atrial size was normal in size. Right Atrium: Right atrial size was normal in size. Pericardium: There is no evidence of pericardial effusion. Mitral Valve: The mitral valve is grossly normal. Trivial mitral valve regurgitation. Tricuspid Valve: The tricuspid valve is grossly normal. Tricuspid valve regurgitation is mild. Aortic Valve: The aortic valve is tricuspid. Aortic valve regurgitation is not visualized. Pulmonic Valve: The pulmonic valve was grossly normal. Pulmonic valve regurgitation is trivial. Aorta: The aortic root is normal in size and structure. Venous: The inferior vena cava is normal in size with greater than 50% respiratory variability, suggesting right atrial pressure of 3 mmHg. IAS/Shunts: No atrial level shunt detected by color flow Doppler.  LEFT VENTRICLE PLAX 2D LVIDd:         4.40 cm     Diastology  LVIDs:         2.70 cm     LV e' lateral:   14.90 cm/s LV PW:         0.90 cm     LV E/e' lateral: 6.5 LV IVS:        0.80 cm     LV e' medial:    9.14 cm/s                            LV E/e' medial:  10.6  LV Volumes (MOD) LV vol d, MOD A4C: 81.9 ml LV vol s, MOD A4C: 28.5 ml LV SV MOD A4C:     81.9 ml RIGHT VENTRICLE RV Basal diam:  3.80 cm RV S prime:     11.10 cm/s TAPSE (M-mode): 2.6 cm LEFT ATRIUM           Index       RIGHT ATRIUM           Index LA diam:      3.20 cm 1.95  cm/m  RA Area:     15.00 cm LA Vol (A4C): 43.6 ml 26.58 ml/m RA Volume:   39.30 ml  23.96 ml/m  AORTIC VALVE LVOT Vmax:   119.00 cm/s LVOT Vmean:  77.000 cm/s LVOT VTI:    0.290 m  AORTA Ao Root diam: 2.70 cm MITRAL VALVE               TRICUSPID VALVE MV Area (PHT): 3.48 cm    TR Peak grad:   23.0 mmHg MV Decel Time: 218 msec    TR Vmax:        240.00 cm/s MV E velocity: 96.80 cm/s MV A velocity: 57.40 cm/s  SHUNTS MV E/A ratio:  1.69        Systemic VTI: 0.29 m Nona Dell MD Electronically signed by Nona Dell MD Signature Date/Time: 12/20/2019/12:22:08 PM    Final    CT HEAD CODE STROKE WO CONTRAST  Result Date: 12/20/2019 CLINICAL DATA:  Code stroke.  Dizziness EXAM: CT HEAD WITHOUT CONTRAST TECHNIQUE: Contiguous axial images were obtained from the base of the skull through the vertex without intravenous contrast. COMPARISON:  None. FINDINGS: Brain: There is no mass, hemorrhage or extra-axial collection. The size and configuration of the ventricles and extra-axial CSF spaces are normal. The brain parenchyma is normal, without evidence of acute or chronic infarction. Vascular: No abnormal hyperdensity of the major intracranial arteries or dural venous sinuses. No intracranial atherosclerosis. Skull: The visualized skull base, calvarium and extracranial soft tissues are normal. Sinuses/Orbits: No fluid levels or advanced mucosal thickening of the visualized paranasal sinuses. No mastoid or middle ear effusion. The orbits  are normal. ASPECTS [facility] Stroke Program Early CT Score) - Ganglionic level infarction (caudate, lentiform nuclei, internal capsule, insula, M1-M3 cortex): 7 - Supraganglionic infarction (M4-M6 cortex): 3 Total score (0-10 with 10 being normal): 10 IMPRESSION: 1. Normal head CT. 2. ASPECTS is 10. * These results were communicated to Dr. Georgiana Spinner Aroor at 12:23 am on 12/20/2019 by text page via the [facility] messaging system. Electronically Signed   By: Deatra Robinson M.D.   On: 12/20/2019 00:23   VAS [place] TRANSCRANIAL DOPPLER  Result Date: 12/20/2019  Transcranial Doppler Indications: Spontaneous vertebral artery dissection. History: Left V1 segment remains occluded just beyond its origin. V2 segment is reconstituted at the C5 level Ordered to evaluate vertebral artery flow direction. Performing Technologist: Jeb Levering RDMS, RVT  Examination Guidelines: A complete evaluation includes B-mode imaging, spectral Doppler, color Doppler, and power Doppler as needed of all accessible portions of each vessel. Bilateral testing is considered an integral part of a complete examination. Limited examinations for reoccurring indications may be performed as noted.  +---------+-------------+----------+-----------+-------+ RIGHT TCDRight VM (cm)Depth (cm)PulsatilityComment +---------+-------------+----------+-----------+-------+ Vertebral   -57.00                 1.12            +---------+-------------+----------+-----------+-------+  +---------+------------+----------+-----------+-------+ LEFT TCD Left VM (cm)Depth (cm)PulsatilityComment +---------+------------+----------+-----------+-------+ Vertebral   -18.00                 79             +---------+------------+----------+-----------+-------+  +------------+-------+-------+             VM cm/sComment +------------+-------+-------+ Prox Basilar-33.00         +------------+-------+-------+ Dist Basilar-38.00          +------------+-------+-------+    Preliminary       Subjective: Feels well, wants to go home. No  numbness, weakness focally, no rotational vertigo. Symptoms seemed to be provoked with looking upwards. No nausea or gait instability.   Discharge Exam: Vitals:   12/20/19 1400 12/20/19 1800  BP: (!) 119/56 117/71  Pulse: 85   Resp: 20 17  Temp:  98.1 F (36.7 C)  SpO2: 99% 100%   General: Pt is alert, awake, not in acute distress Cardiovascular: RRR, S1/S2 +, no rubs, no gallops Respiratory: CTA bilaterally, no wheezing, no rhonchi Abdominal: Soft, NT, ND, bowel sounds + Extremities: No edema, no cyanosis  Labs: BNP (last 3 results) No results for input(s): BNP in the last 8760 hours. Basic Metabolic Panel: Recent Labs  Lab 12/19/19 2355 12/20/19 0007 12/20/19 0411  NA 138 139 139  K 4.1 3.8 3.7  CL 101 102 111  CO2 27  --  22  GLUCOSE 158* 154* 101*  BUN 23* 24* 15  CREATININE 0.95 0.90 0.77  CALCIUM 9.2  --  7.7*   Liver Function Tests: Recent Labs  Lab 12/19/19 2355 12/20/19 0411  AST 31 26  ALT 22 19  ALKPHOS 75 60  BILITOT 0.4 0.7  PROT 6.7 5.8*  ALBUMIN 4.1 3.4*   No results for input(s): LIPASE, AMYLASE in the last 168 hours. No results for input(s): AMMONIA in the last 168 hours. CBC: Recent Labs  Lab 12/19/19 2355 12/20/19 0007 12/20/19 0411  WBC 9.3  --  6.6  NEUTROABS 5.0  --   --   HGB 14.3 15.0 12.2  HCT 42.1 44.0 37.2  MCV 95.0  --  95.1  PLT 220  --  185   Cardiac Enzymes: No results for input(s): CKTOTAL, CKMB, CKMBINDEX, TROPONINI in the last 168 hours. BNP: Invalid input(s): POCBNP CBG: Recent Labs  Lab 12/20/19 0241  GLUCAP 111*   D-Dimer No results for input(s): DDIMER in the last 72 hours. Hgb A1c Recent Labs    12/20/19 0411  HGBA1C 5.6   Lipid Profile Recent Labs    12/20/19 0411  CHOL 143  HDL 42  LDLCALC 78  TRIG 115  CHOLHDL 3.4   Thyroid function studies Recent Labs    12/20/19 0411  TSH 2.236    Anemia work up No results for input(s): VITAMINB12, FOLATE, FERRITIN, TIBC, IRON, RETICCTPCT in the last 72 hours. Urinalysis    Component Value Date/Time   COLORURINE COLORLESS (A) 12/20/2019 0350   APPEARANCEUR CLEAR 12/20/2019 0350   LABSPEC 1.018 12/20/2019 0350   PHURINE 8.0 12/20/2019 0350   GLUCOSEU NEGATIVE 12/20/2019 0350   HGBUR NEGATIVE 12/20/2019 0350   HGBUR negative 05/06/2010 1438   BILIRUBINUR NEGATIVE 12/20/2019 0350   KETONESUR NEGATIVE 12/20/2019 0350   PROTEINUR NEGATIVE 12/20/2019 0350   UROBILINOGEN 0.2 05/06/2010 1438   NITRITE NEGATIVE 12/20/2019 0350   LEUKOCYTESUR NEGATIVE 12/20/2019 0350    Microbiology No results found for this or any previous visit (from the past 240 hour(s)).  Time coordinating discharge: Approximately 40 minutes  Tyrone Nine, MD  [facility] 12/20/2019, 7:20 PM

## 2019-12-20 NOTE — ED Notes (Signed)
Emphasized adhering to discharge orders Follow up with Neurologist and NeuroSurgery Patient verbalizes understanding of discharge instructions. Opportunity for questioning and answers were provided. Armband removed by staff, pt discharged from ED

## 2019-12-20 NOTE — ED Notes (Signed)
Breakfast Ordered 

## 2019-12-20 NOTE — ED Notes (Signed)
Neurologist at bedside. 

## 2019-12-20 NOTE — ED Notes (Signed)
Informed patient we need urine sample

## 2019-12-24 DIAGNOSIS — I10 Essential (primary) hypertension: Secondary | ICD-10-CM | POA: Diagnosis not present

## 2019-12-24 DIAGNOSIS — I6509 Occlusion and stenosis of unspecified vertebral artery: Secondary | ICD-10-CM | POA: Diagnosis not present

## 2019-12-24 DIAGNOSIS — Z6828 Body mass index (BMI) 28.0-28.9, adult: Secondary | ICD-10-CM | POA: Diagnosis not present

## 2019-12-29 ENCOUNTER — Other Ambulatory Visit: Payer: Self-pay | Admitting: Neurology

## 2019-12-29 ENCOUNTER — Other Ambulatory Visit: Payer: Self-pay

## 2019-12-29 MED ORDER — ATORVASTATIN CALCIUM 40 MG PO TABS: 40.0000 mg | ORAL_TABLET | Freq: Every day | ORAL | 3 refills | Status: AC

## 2019-12-29 MED ORDER — CLOPIDOGREL BISULFATE 75 MG PO TABS: 75.0000 mg | ORAL_TABLET | Freq: Every day | ORAL | 3 refills | Status: AC

## 2019-12-29 MED ORDER — ASPIRIN 81 MG PO TBEC: 81.0000 mg | DELAYED_RELEASE_TABLET | Freq: Every day | ORAL | 3 refills | Status: AC

## 2020-01-06 ENCOUNTER — Other Ambulatory Visit: Payer: Self-pay | Admitting: Neurology

## 2020-01-07 ENCOUNTER — Telehealth: Payer: Self-pay | Admitting: Neurology

## 2020-01-07 NOTE — Telephone Encounter (Signed)
On 12-29-19 Dr Everlena Cooper wanted to work the patient in to see him with in the next three weeks. I called and left messages for patient to call back to make appt. We left messages on 12-29-19 01-02-20 and 01-07-20. We have not heard back from the patient

## 2020-01-21 ENCOUNTER — Telehealth: Payer: Self-pay | Admitting: Neurology

## 2020-01-21 NOTE — Telephone Encounter (Signed)
Patient called in wanting to see if Dr. Everlena Cooper would like for her to keep taking Verapamil ER 120 MG. She was prescribed 4 medications in the emergency department. She has gotten refills for the other 3, but the pharmacist stated they keep rejecting this medication for refill. She is not sure what to do before her appointment.

## 2020-01-22 ENCOUNTER — Other Ambulatory Visit: Payer: Self-pay

## 2020-01-22 MED ORDER — VERAPAMIL HCL ER 120 MG PO TBCR: 120.0000 mg | EXTENDED_RELEASE_TABLET | Freq: Every day | ORAL | 3 refills | Status: AC

## 2020-01-22 NOTE — Telephone Encounter (Signed)
Pt advised of Dr. Everlena Cooper note. Medication refilled with 3 additional refills

## 2020-01-22 NOTE — Telephone Encounter (Signed)
We can refill it with 3 additional refills until her appointment.  I have no objection to this medication as a treatment.

## 2020-02-03 ENCOUNTER — Other Ambulatory Visit: Payer: Self-pay | Admitting: General Practice

## 2020-02-03 MED ORDER — LEVOTHYROXINE SODIUM 112 MCG PO TABS: ORAL_TABLET | ORAL | 0 refills | Status: DC

## 2020-04-29 NOTE — Progress Notes (Deleted)
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  UPDATE: Last seen in June 2020.  At that time, I advised to repeat CTA in 6 months but patient lost to follow up.  She presented to the ED on 12/19/2019 for sudden onset of severe dizziness with ataxia, nausea and vomiting, which lasted 2 to 3 hours but still had some residual positional dizziness.  CTA of head and neck showed small caliber but patent right vertebral artery and proximal occluded left vertebral artery with distal reconstitution at the C5 level and small caliber.  MRI of brain showed old right cerebellar infarct but was negative for stroke.   TCD showed bilateral antegrade flow of the vertebral arteries.  TTE showed EF 60 to 65% with no cardiac source of emboli.  LDL was 78 and Hgb A1c 5.6.  She was diagnosed with TIA or possibly basilar type migraine.  She was on simvastatin 5mg  daily and not on antiplatelet therapy and was discharged on dual antiplatelet therapy for 3 months followed by ASA 81mg  daily alone, as well as atorvastatin 10mg .  She was also started on verapamil 120mg  CR nightly for presumed effect of vasodilation.  She was referred to endovascular neurosurgery for cerebral angiogram to rule out Bow Hunter syndrome.  Headaches:  She has a menstrual migraine for 2 days.  She takes indomethacin 50mg  prior to any activity.  She recently restarted going to the gym with light activity.    HISTORY: In February 2017, she sustained a right cerebellar stroke secondary to right vertebral dissection.She was falling to the right and became plegic on the right side.She was [facility].MRI of brain revealed acute right  cerebellar infarct.MRA neck revealed right vertebral artery stenosis.Follow up CTA of neck confirmed right vertebral artery focal dissection at C1-2.There was segment irregularity in the right vertebral artery which raised suspicion of fibromuscular dysplasia.Echo was normal.She had nonspecific elevation of ANA.Hgb A1c 5.7%.LDL was 76.She was started on Lipitor 10mg  daily.She was started on ASA 325mg  and Plavix 75mg  daily. Repeat MRA of the neck from 11/25/15 appears unchanged, revealing right vertebral dissection but vessel is patent and without pseudoaneurysm. However, it is difficult to really visualize the vessel since it is small. CTA of neck from 12/29/15 was personally reviewed and revealed that the focal dissection had resolved. Lipitor and Plavix were subsequently discontinued and she was continued on ASA.Regarding the elevated ANA, she followed up with rheumatology and workup was negative.  Following the stroke, she was experiencing right sided headache.She previously responded to nortriptyline 50mg .  She also has migraines presenting as severe right frontal throbbing pain associated with nausea, vomiting, photophobia and phonophobia. No associated visual disturbance or unilateral numbness or weakness. Historically, they are menstrual-related, occurring daily for 2 weeks beginning the week of her menstrual period.  In May 2018, she started experiencing left posterior neck pain which radiates as a throbbing mild to moderate pain down the lateral aspect of her left arm up to just below the elbow. For the first two days, she noted numbness and tingling in the last 2 fingers of her left hand, but that has since resolved. She denies any weakness, ataxia or visual disturbance. The pain is constant. Looking up or  down, or turning her head to the right exacerbates the pain. Holding her elbow with her arm crossed helps relieve the throbbing arm pain. She stopped exercising  at that time. She tried muscle relaxants, stretches, heat, ice, acupuncture and has seen a chiropractor (without high-velocity, low amplitude techniques or manipulation), which have been ineffective.She was diagnosed with left sided cervical radiculopathy. She was started on gabapentin to treat radicular pain and headache. For menstrual migraine, she was prescribed a perimenstrual prophylaxis of naproxen 500mg  twice daily for 2 weeks, starting one week prior to onset of period. MRI of cervical spine from 01/26/17 was personally reviewed and demonstrated disc osteophyte complex causing severe left C5-6 neural foraminal stenosis with impingement of C6 nerve root. She was advised to follow up with surgery. She underwent ACDF C5-6 on 03/08/17.  In September 2019, she started getting a headache while working out at [birth year]-09-1992.Itwould occur1 to 2 hours after she works out. Even mildly strenuous activitywould triggerit. They start in back of neck on the right and radiate up to behind the right eye. It lasts until she goes to bed and it is resolved when she wakes up. No associated visual disturbance, slurred speech, vertigo or unilateral numbness or weakness. No radicular pain down the right arm. To evaluate new onset headaches, she had a CTA of the head and neck performed on 05/15/18 which was personally reviewed and demonstrated occluded left vertebral artery 10 mm beyond its origin but reconstituted at C5-C6 and remaining patent to the vertebrobasilar junction with patent left PICA. Right vertebral artery appears improved compared to prior imaging in 2017. She had a follow up MRI of the brain on 05/25/18, which was personally reviewed and demonstrated remote right cerebellar infarct but no new infarct corresponding the the occluded left vertebral artery. Vasculitis workup, including ANA, Sed Rate, ANCA and Sjogren's antibodies, was unremarkable. She was advised to restart ASA 81mg  daily. She was  started on propranolol 60mg for headaches but it was ineffective. I recommended Aimovig but she has not started it. Headaches are menstrual related. She started light exercise a few weeks ago. She does cross fit but no weights or running. She started indomethacin 30 days prior to going to the gym, which has been effective.  CTA of abdomen and pelvis from 10/11/18 showed no evidence of renal artery fibromuscular dysplasia.  She saw Dr. at [facility] for a second opinion.  If repeat CTA is again abnormal, he recommended catheter-angiogram for evaluation of direction of flow and for fibromuscular dysplasia.  He recommended restarting statin for LDL goal less than 70.  Repeat CTA of neck from 01/14/19 redemonstrated left vertebral artery occlusion 1 cm beyond its origin from the arch with reconstitution at the C5-6 level by cervical collaterals with patency beyond that.  There is also dilatation of the left vertebral artery at the C2-3 level with diameter of 4.5 mm, stable compared to imaging prior to the left vertebral artery dissection and therefore believed by radiology not to represent a true fusiform dilatation.  Right vertebral artery is normal.  Prior treatment for headaches (menstrual migraines and exertional headaches): Propranolol ER 60mg  (discontinued due to dizziness) She tried topiramate but stopped after 2 months because ineffective. Naproxen perimenstrual prophylaxis (ineffective)  PAST MEDICAL HISTORY: Past Medical History:  Diagnosis Date  . Anemia   . Asthma    childhood asthma - no inhaler, no problems as adult  . Bipolar disorder (HCC)    dx while in college, no current meds  .  Depression    no current meds  . Endometriosis   . Headache    HX  MIGRAINES During menstrual cycle  . Hypothyroidism childhood  . Infertility, female   . Seasonal allergies   . Stroke (HCC) 09/09/15   hadright cerebellar infarct secondary to right vertebral artery dissection-no deficits, no  treatment, no problems since 09/2015  . Vertebral artery dissection (HCC) 2017    MEDICATIONS: Current Outpatient Medications on File Prior to Visit  Medication Sig Dispense Refill  . acetaminophen (TYLENOL) 325 MG tablet Take 2 tablets (650 mg total) by mouth every 4 (four) hours as needed for mild pain (temperature > 101.5.).    Marland Kitchen aspirin 81 MG EC tablet Take 1 tablet (81 mg total) by mouth daily. 30 tablet 3  . atorvastatin (LIPITOR) 40 MG tablet Take 1 tablet (40 mg total) by mouth daily. 30 tablet 3  . Azelastine HCl 0.15 % SOLN USE 1 SPRAYS IN EACH NOSTRIL TWICE DAILY (Patient taking differently: Place 1 spray into both nostrils daily. ) 90 mL 0  . clopidogrel (PLAVIX) 75 MG tablet Take 1 tablet (75 mg total) by mouth daily. 30 tablet 3  . ferrous sulfate 325 (65 FE) MG tablet Take 325 mg by mouth daily with breakfast.    . fluticasone (FLONASE) 50 MCG/ACT nasal spray USE 1 TO 2 SPRAYS INTO THE  NOSE DAILY (Patient taking differently: Place 1 spray into both nostrils 2 (two) times daily. ) 32 g 2  . levothyroxine (SYNTHROID) 112 MCG tablet TAKE 1 TABLET(112 MCG) BY MOUTH DAILY 90 tablet 0  . Multiple Vitamins-Minerals (ADULT ONE DAILY GUMMIES PO) Take 2 tablets by mouth daily.    . NON FORMULARY Biote HRT injection every 4 months    . PROAIR HFA 108 (90 Base) MCG/ACT inhaler INHALE 2 PUFFS INTO THE LUNGS EVERY 6 HOURS AS NEEDED FOR WHEEZING OR SHORTNESS OF BREATH (Patient taking differently: Inhale 2 puffs into the lungs every 6 (six) hours as needed for wheezing or shortness of breath. ) 8.5 g 1  . verapamil (CALAN-SR) 120 MG CR tablet Take 1 tablet (120 mg total) by mouth at bedtime. 30 tablet 3  . VITAMIN D PO Take 5,000 Units by mouth daily.     Marland Kitchen zonisamide (ZONEGRAN) 50 MG capsule Take 150 mg by mouth daily.     No current facility-administered medications on file prior to visit.    ALLERGIES: Allergies  Allergen Reactions  . Morphine And Related Other (See Comments)     Headache for a long time per pt  . Hydrocortisone Rash  . Neosporin [Neomycin-Bacitracin Zn-Polymyx] Rash    FAMILY HISTORY: Family History  Problem Relation Age of Onset  . Colon cancer Brother 33       chemo, sx   . Hyperlipidemia Mother   . Hypertension Father   . Hyperlipidemia Father   . Heart attack Father   . Diabetes Father    ***.  SOCIAL HISTORY: Social History   Socioeconomic History  . Marital status: Married    Spouse name: Not on file  . Number of children: 3  . Years of education: Not on file  . Highest education level: Not on file  Occupational History  . Not on file  Tobacco Use  . Smoking status: Never Smoker  . Smokeless tobacco: Never Used  Vaping Use  . Vaping Use: Never used  Substance and Sexual Activity  . Alcohol use: No  . Drug use: No  . Sexual  activity: Yes    Birth control/protection: None  Other Topics Concern  . Not on file  Social History Narrative   Right handed   Lives in single story home with husband, 3 kids and mother   Social Determinants of Health   Financial Resource Strain:   . Difficulty of Paying Living Expenses: Not on file  Food Insecurity:   . Worried About Programme researcher, broadcasting/film/video in the Last Year: Not on file  . Ran Out of Food in the Last Year: Not on file  Transportation Needs:   . Lack of Transportation (Medical): Not on file  . Lack of Transportation (Non-Medical): Not on file  Physical Activity:   . Days of Exercise per Week: Not on file  . Minutes of Exercise per Session: Not on file  Stress:   . Feeling of Stress : Not on file  Social Connections:   . Frequency of Communication with Friends and Family: Not on file  . Frequency of Social Gatherings with Friends and Family: Not on file  . Attends Religious Services: Not on file  . Active Member of Clubs or Organizations: Not on file  . Attends Banker Meetings: Not on file  . Marital Status: Not on file  Intimate Partner Violence:   . Fear  of Current or Ex-Partner: Not on file  . Emotionally Abused: Not on file  . Physically Abused: Not on file  . Sexually Abused: Not on file    REVIEW OF SYSTEMS: Constitutional: No fevers, chills, or sweats, no generalized fatigue, change in appetite Eyes: No visual changes, double vision, eye pain Ear, nose and throat: No hearing loss, ear pain, nasal congestion, sore throat Cardiovascular: No chest pain, palpitations Respiratory:  No shortness of breath at rest or with exertion, wheezes GastrointestinaI: No nausea, vomiting, diarrhea, abdominal pain, fecal incontinence Genitourinary:  No dysuria, urinary retention or frequency Musculoskeletal:  No neck pain, back pain Integumentary: No rash, pruritus, skin lesions Neurological: as above Psychiatric: No depression, insomnia, anxiety Endocrine: No palpitations, fatigue, diaphoresis, mood swings, change in appetite, change in weight, increased thirst Hematologic/Lymphatic:  No purpura, petechiae. Allergic/Immunologic: no itchy/runny eyes, nasal congestion, recent allergic reactions, rashes  PHYSICAL EXAM: *** General: No acute distress.  Patient appears ***-groomed.   Head:  Normocephalic/atraumatic Eyes:  Fundi examined but not visualized Neck: supple, no paraspinal tenderness, full range of motion Heart:  Regular rate and rhythm Lungs:  Clear to auscultation bilaterally Back: No paraspinal tenderness Neurological Exam: alert and oriented to person, place, and time. Attention span and concentration intact, recent and remote memory intact, fund of knowledge intact.  Speech fluent and not dysarthric, language intact.  CN II-XII intact. Bulk and tone normal, muscle strength 5/5 throughout.  Sensation to light touch, temperature and vibration intact.  Deep tendon reflexes 2+ throughout, toes downgoing.  Finger to nose and heel to shin testing intact.  Gait normal, Romberg negative.  IMPRESSION: ***  PLAN: ***  Shon Millet, DO  CC:  ***

## 2020-05-03 ENCOUNTER — Ambulatory Visit: Payer: BC Managed Care – PPO | Admitting: Neurology

## 2020-05-04 ENCOUNTER — Other Ambulatory Visit: Payer: Self-pay | Admitting: Family Medicine

## 2020-08-04 DIAGNOSIS — M24275 Disorder of ligament, left foot: Secondary | ICD-10-CM | POA: Diagnosis not present

## 2020-08-04 DIAGNOSIS — M722 Plantar fascial fibromatosis: Secondary | ICD-10-CM | POA: Diagnosis not present

## 2020-08-04 DIAGNOSIS — M79672 Pain in left foot: Secondary | ICD-10-CM | POA: Diagnosis not present

## 2020-08-09 ENCOUNTER — Other Ambulatory Visit: Payer: Self-pay

## 2020-08-09 ENCOUNTER — Other Ambulatory Visit: Payer: Self-pay | Admitting: Neurology

## 2020-08-09 MED ORDER — LEVOTHYROXINE SODIUM 112 MCG PO TABS: ORAL_TABLET | ORAL | 0 refills | Status: DC

## 2020-09-08 ENCOUNTER — Other Ambulatory Visit: Payer: Self-pay | Admitting: Neurology

## 2020-10-04 ENCOUNTER — Encounter: Payer: BC Managed Care – PPO | Admitting: Family Medicine

## 2020-10-04 DIAGNOSIS — Z0289 Encounter for other administrative examinations: Secondary | ICD-10-CM

## 2020-11-07 ENCOUNTER — Other Ambulatory Visit: Payer: Self-pay | Admitting: Family Medicine

## 2020-11-09 ENCOUNTER — Telehealth: Payer: Self-pay | Admitting: Family Medicine

## 2020-11-09 NOTE — Telephone Encounter (Signed)
Pt states that she cancelled her appt on 10/04/20 via text, she states that she received a no show fee. Can we remove this due to pt cancelling appt via text.  Please advise  Pt can be reached at the cell #

## 2020-11-11 NOTE — Telephone Encounter (Signed)
Submitted to charge correction to be voided.   Left patient vm.

## 2020-12-15 DIAGNOSIS — M722 Plantar fascial fibromatosis: Secondary | ICD-10-CM | POA: Diagnosis not present

## 2020-12-15 DIAGNOSIS — M79672 Pain in left foot: Secondary | ICD-10-CM | POA: Diagnosis not present

## 2021-01-05 DIAGNOSIS — M722 Plantar fascial fibromatosis: Secondary | ICD-10-CM | POA: Diagnosis not present

## 2021-02-02 DIAGNOSIS — M722 Plantar fascial fibromatosis: Secondary | ICD-10-CM | POA: Diagnosis not present

## 2021-02-02 DIAGNOSIS — M79672 Pain in left foot: Secondary | ICD-10-CM | POA: Diagnosis not present

## 2021-03-23 DIAGNOSIS — M722 Plantar fascial fibromatosis: Secondary | ICD-10-CM | POA: Diagnosis not present

## 2021-03-30 ENCOUNTER — Other Ambulatory Visit: Payer: Self-pay

## 2021-03-30 ENCOUNTER — Telehealth: Payer: Self-pay

## 2021-03-30 MED ORDER — LEVOTHYROXINE SODIUM 112 MCG PO TABS: ORAL_TABLET | ORAL | 1 refills | Status: AC

## 2021-03-30 NOTE — Telephone Encounter (Signed)
Patient moved to [place] and they are six months out on new patient appts.  Patient is out of her levothyroxine 0.112 - Patient needs it sent to [facility]  at [address], [place] [address].  Patient has not seen Dr. Beverely Low since 2021.

## 2021-03-30 NOTE — Telephone Encounter (Signed)
Patient moved to [place] and they are six months out on new patient appts.  Patient is out of her levothyroxine 0.112 - Patient needs it sent to [facility]  at [address], [place] [address].  Patient has not seen Dr. Beverely Low since 2021.  LFD 08/09/20 #90 with no refills LOV 10/02/19 NOV none

## 2021-03-30 NOTE — Telephone Encounter (Signed)
Request sent to provider for approval.  

## 2021-04-20 DIAGNOSIS — M722 Plantar fascial fibromatosis: Secondary | ICD-10-CM | POA: Diagnosis not present

## 2021-05-18 DIAGNOSIS — Z139 Encounter for screening, unspecified: Secondary | ICD-10-CM | POA: Diagnosis not present

## 2021-05-18 DIAGNOSIS — E039 Hypothyroidism, unspecified: Secondary | ICD-10-CM | POA: Diagnosis not present

## 2021-05-18 DIAGNOSIS — Z0000 Encounter for general adult medical examination without abnormal findings: Secondary | ICD-10-CM | POA: Diagnosis not present

## 2021-05-18 DIAGNOSIS — Z79899 Other long term (current) drug therapy: Secondary | ICD-10-CM | POA: Diagnosis not present

## 2021-05-18 DIAGNOSIS — Z6827 Body mass index (BMI) 27.0-27.9, adult: Secondary | ICD-10-CM | POA: Diagnosis not present

## 2021-05-20 DIAGNOSIS — Z79899 Other long term (current) drug therapy: Secondary | ICD-10-CM | POA: Diagnosis not present

## 2021-05-20 DIAGNOSIS — Z0000 Encounter for general adult medical examination without abnormal findings: Secondary | ICD-10-CM | POA: Diagnosis not present

## 2021-07-07 DIAGNOSIS — Z8669 Personal history of other diseases of the nervous system and sense organs: Secondary | ICD-10-CM | POA: Diagnosis not present

## 2021-07-07 DIAGNOSIS — Z6827 Body mass index (BMI) 27.0-27.9, adult: Secondary | ICD-10-CM | POA: Diagnosis not present

## 2021-07-07 DIAGNOSIS — E039 Hypothyroidism, unspecified: Secondary | ICD-10-CM | POA: Diagnosis not present

## 2021-07-07 DIAGNOSIS — N926 Irregular menstruation, unspecified: Secondary | ICD-10-CM | POA: Diagnosis not present

## 2021-07-07 DIAGNOSIS — J302 Other seasonal allergic rhinitis: Secondary | ICD-10-CM | POA: Diagnosis not present

## 2021-07-07 DIAGNOSIS — N959 Unspecified menopausal and perimenopausal disorder: Secondary | ICD-10-CM | POA: Diagnosis not present

## 2021-07-08 DIAGNOSIS — Z8679 Personal history of other diseases of the circulatory system: Secondary | ICD-10-CM | POA: Diagnosis not present

## 2021-07-08 DIAGNOSIS — Z28310 Unvaccinated for covid-19: Secondary | ICD-10-CM | POA: Diagnosis not present

## 2021-07-08 DIAGNOSIS — R112 Nausea with vomiting, unspecified: Secondary | ICD-10-CM | POA: Diagnosis not present

## 2021-07-08 DIAGNOSIS — I6523 Occlusion and stenosis of bilateral carotid arteries: Secondary | ICD-10-CM | POA: Diagnosis not present

## 2021-07-08 DIAGNOSIS — I672 Cerebral atherosclerosis: Secondary | ICD-10-CM | POA: Diagnosis not present

## 2021-07-08 DIAGNOSIS — G43909 Migraine, unspecified, not intractable, without status migrainosus: Secondary | ICD-10-CM | POA: Diagnosis not present

## 2021-07-08 DIAGNOSIS — R519 Headache, unspecified: Secondary | ICD-10-CM | POA: Diagnosis not present

## 2021-07-08 DIAGNOSIS — M542 Cervicalgia: Secondary | ICD-10-CM | POA: Diagnosis not present

## 2021-07-21 DIAGNOSIS — M5481 Occipital neuralgia: Secondary | ICD-10-CM | POA: Diagnosis not present

## 2021-07-21 DIAGNOSIS — I7774 Dissection of vertebral artery: Secondary | ICD-10-CM | POA: Diagnosis not present

## 2021-07-21 DIAGNOSIS — G43719 Chronic migraine without aura, intractable, without status migrainosus: Secondary | ICD-10-CM | POA: Diagnosis not present

## 2021-07-21 DIAGNOSIS — I639 Cerebral infarction, unspecified: Secondary | ICD-10-CM | POA: Diagnosis not present

## 2021-09-02 DIAGNOSIS — M5451 Vertebrogenic low back pain: Secondary | ICD-10-CM | POA: Diagnosis not present

## 2021-09-02 DIAGNOSIS — G43719 Chronic migraine without aura, intractable, without status migrainosus: Secondary | ICD-10-CM | POA: Diagnosis not present

## 2021-09-02 DIAGNOSIS — I7774 Dissection of vertebral artery: Secondary | ICD-10-CM | POA: Diagnosis not present

## 2021-09-02 DIAGNOSIS — I639 Cerebral infarction, unspecified: Secondary | ICD-10-CM | POA: Diagnosis not present

## 2022-10-18 ENCOUNTER — Encounter: Payer: Self-pay | Admitting: Gastroenterology
